# Patient Record
Sex: Female | Born: 1940 | Race: Black or African American | Hispanic: No | Smoking: Never smoker
Health system: Southern US, Community
[De-identification: ages and names within clinical notes are randomized; demographics above are authoritative.]

## PROBLEM LIST (undated history)

## (undated) DIAGNOSIS — J45909 Unspecified asthma, uncomplicated: Secondary | ICD-10-CM

## (undated) DIAGNOSIS — I10 Essential (primary) hypertension: Secondary | ICD-10-CM

## (undated) DIAGNOSIS — K649 Unspecified hemorrhoids: Secondary | ICD-10-CM

## (undated) DIAGNOSIS — K589 Irritable bowel syndrome without diarrhea: Secondary | ICD-10-CM

## (undated) DIAGNOSIS — K635 Polyp of colon: Secondary | ICD-10-CM

## (undated) HISTORY — DX: Polyp of colon: K63.5

## (undated) HISTORY — DX: Unspecified asthma, uncomplicated: J45.909

## (undated) HISTORY — DX: Essential (primary) hypertension: I10

## (undated) HISTORY — DX: Irritable bowel syndrome, unspecified: K58.9

## (undated) HISTORY — PX: ECTOPIC PREGNANCY SURGERY: SHX613

## (undated) HISTORY — DX: Unspecified hemorrhoids: K64.9

## (undated) HISTORY — PX: ABDOMINAL HYSTERECTOMY: SHX81

---

## 1972-04-01 HISTORY — PX: OVARY SURGERY: SHX727

## 2009-04-01 HISTORY — PX: COLONOSCOPY: SHX174

## 2009-12-06 ENCOUNTER — Ambulatory Visit: Payer: Self-pay | Admitting: Gastroenterology

## 2010-03-07 DIAGNOSIS — K635 Polyp of colon: Secondary | ICD-10-CM

## 2010-03-07 HISTORY — DX: Polyp of colon: K63.5

## 2011-04-15 DIAGNOSIS — G4700 Insomnia, unspecified: Secondary | ICD-10-CM | POA: Diagnosis not present

## 2011-04-15 DIAGNOSIS — E0789 Other specified disorders of thyroid: Secondary | ICD-10-CM | POA: Diagnosis not present

## 2011-04-15 DIAGNOSIS — J309 Allergic rhinitis, unspecified: Secondary | ICD-10-CM | POA: Diagnosis not present

## 2011-04-15 DIAGNOSIS — I059 Rheumatic mitral valve disease, unspecified: Secondary | ICD-10-CM | POA: Diagnosis not present

## 2011-04-15 DIAGNOSIS — I10 Essential (primary) hypertension: Secondary | ICD-10-CM | POA: Diagnosis not present

## 2011-04-15 DIAGNOSIS — L258 Unspecified contact dermatitis due to other agents: Secondary | ICD-10-CM | POA: Diagnosis not present

## 2011-04-15 DIAGNOSIS — R002 Palpitations: Secondary | ICD-10-CM | POA: Diagnosis not present

## 2011-04-15 DIAGNOSIS — I499 Cardiac arrhythmia, unspecified: Secondary | ICD-10-CM | POA: Diagnosis not present

## 2011-06-10 DIAGNOSIS — K5732 Diverticulitis of large intestine without perforation or abscess without bleeding: Secondary | ICD-10-CM | POA: Diagnosis not present

## 2011-08-08 DIAGNOSIS — R002 Palpitations: Secondary | ICD-10-CM | POA: Diagnosis not present

## 2011-08-08 DIAGNOSIS — J309 Allergic rhinitis, unspecified: Secondary | ICD-10-CM | POA: Diagnosis not present

## 2011-08-08 DIAGNOSIS — E0789 Other specified disorders of thyroid: Secondary | ICD-10-CM | POA: Diagnosis not present

## 2011-08-08 DIAGNOSIS — L258 Unspecified contact dermatitis due to other agents: Secondary | ICD-10-CM | POA: Diagnosis not present

## 2011-08-08 DIAGNOSIS — G4700 Insomnia, unspecified: Secondary | ICD-10-CM | POA: Diagnosis not present

## 2011-08-08 DIAGNOSIS — I059 Rheumatic mitral valve disease, unspecified: Secondary | ICD-10-CM | POA: Diagnosis not present

## 2011-08-08 DIAGNOSIS — I499 Cardiac arrhythmia, unspecified: Secondary | ICD-10-CM | POA: Diagnosis not present

## 2011-08-08 DIAGNOSIS — I10 Essential (primary) hypertension: Secondary | ICD-10-CM | POA: Diagnosis not present

## 2011-09-03 DIAGNOSIS — E0789 Other specified disorders of thyroid: Secondary | ICD-10-CM | POA: Diagnosis not present

## 2011-09-03 DIAGNOSIS — G4700 Insomnia, unspecified: Secondary | ICD-10-CM | POA: Diagnosis not present

## 2011-09-03 DIAGNOSIS — I10 Essential (primary) hypertension: Secondary | ICD-10-CM | POA: Diagnosis not present

## 2011-09-03 DIAGNOSIS — J309 Allergic rhinitis, unspecified: Secondary | ICD-10-CM | POA: Diagnosis not present

## 2011-09-03 DIAGNOSIS — I059 Rheumatic mitral valve disease, unspecified: Secondary | ICD-10-CM | POA: Diagnosis not present

## 2011-09-03 DIAGNOSIS — I499 Cardiac arrhythmia, unspecified: Secondary | ICD-10-CM | POA: Diagnosis not present

## 2011-10-28 DIAGNOSIS — Z01419 Encounter for gynecological examination (general) (routine) without abnormal findings: Secondary | ICD-10-CM | POA: Diagnosis not present

## 2011-10-28 DIAGNOSIS — Z1211 Encounter for screening for malignant neoplasm of colon: Secondary | ICD-10-CM | POA: Diagnosis not present

## 2011-10-30 ENCOUNTER — Ambulatory Visit (INDEPENDENT_AMBULATORY_CARE_PROVIDER_SITE_OTHER): Payer: Medicare Other | Admitting: Internal Medicine

## 2011-10-30 ENCOUNTER — Encounter: Payer: Self-pay | Admitting: Internal Medicine

## 2011-10-30 ENCOUNTER — Telehealth: Payer: Self-pay | Admitting: Internal Medicine

## 2011-10-30 VITALS — BP 130/68 | HR 72 | Temp 98.0°F | Resp 14 | Ht 63.5 in | Wt 127.8 lb

## 2011-10-30 DIAGNOSIS — M8580 Other specified disorders of bone density and structure, unspecified site: Secondary | ICD-10-CM

## 2011-10-30 DIAGNOSIS — E039 Hypothyroidism, unspecified: Secondary | ICD-10-CM | POA: Diagnosis not present

## 2011-10-30 DIAGNOSIS — M810 Age-related osteoporosis without current pathological fracture: Secondary | ICD-10-CM | POA: Insufficient documentation

## 2011-10-30 DIAGNOSIS — G4700 Insomnia, unspecified: Secondary | ICD-10-CM | POA: Insufficient documentation

## 2011-10-30 DIAGNOSIS — M1710 Unilateral primary osteoarthritis, unspecified knee: Secondary | ICD-10-CM | POA: Insufficient documentation

## 2011-10-30 DIAGNOSIS — M949 Disorder of cartilage, unspecified: Secondary | ICD-10-CM

## 2011-10-30 DIAGNOSIS — K5909 Other constipation: Secondary | ICD-10-CM

## 2011-10-30 DIAGNOSIS — J45909 Unspecified asthma, uncomplicated: Secondary | ICD-10-CM | POA: Insufficient documentation

## 2011-10-30 DIAGNOSIS — K5900 Constipation, unspecified: Secondary | ICD-10-CM

## 2011-10-30 DIAGNOSIS — I10 Essential (primary) hypertension: Secondary | ICD-10-CM

## 2011-10-30 DIAGNOSIS — M899 Disorder of bone, unspecified: Secondary | ICD-10-CM | POA: Diagnosis not present

## 2011-10-30 MED ORDER — ALBUTEROL SULFATE HFA 108 (90 BASE) MCG/ACT IN AERS: 2.0000 | INHALATION_SPRAY | Freq: Four times a day (QID) | RESPIRATORY_TRACT | Status: DC | PRN

## 2011-10-30 NOTE — Patient Instructions (Addendum)
Try the [place] "Carb balance" tortilla for sandwhiches: to increase the fiber in your diet naturally.    We will get your records from Dr. Juel Burrow, and determine when you need repeat labs.

## 2011-10-30 NOTE — Assessment & Plan Note (Addendum)
Primarily affecting the left knee. She has a history of a torn meniscus which she managed conservatively with physical therapy Advil and tramadol. She currently Walks 2 miles daily and works out at [facility] [employer].  There has been no escalation of symptoms. We'll continue current management.

## 2011-10-30 NOTE — Assessment & Plan Note (Signed)
Prior trial of losartan caused dizziness and "out of body experience"  ,  Continue amlodipine .

## 2011-10-30 NOTE — Telephone Encounter (Signed)
OOPS!! YES ,  i HAVE PUT THE  Albuterol inhaler on your desk with her name on it.  She can pick it up anytime.  I will update her chart .

## 2011-10-30 NOTE — Progress Notes (Signed)
Patient ID: Holly Perez, female   DOB: 1941-02-06, 71 y.o.   MRN: [id number]  Patient Active Problem List  Diagnosis  . Unspecified hypothyroidism  . Asthma  . DJD (degenerative joint disease) of knee  . Osteopenia  . Hypertension  . Constipation, chronic  . Insomnia    Subjective:  CC:   Chief Complaint  Patient presents with  . New Patient    HPI:   Holly Perez is a 71 y.o. female who presents as a new patient to establish primary care with the chief complaint of allergic rhinitis,  Hypertension,  Osteopenia and DJD. New patient. She is transferring from Dr. Juel Burrow.  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 She has osteopenia previously treated with alendronate, now off of medications to his of osteonecrosis another long term adverse effects on bones..    Past Medical History  Diagnosis Date  . Asthma     Past Surgical History  Procedure Date  . Ectopic pregnancy surgery     at age 27  . Laparoscopic supracervical hysterectomy     Family History  Problem Relation Age of Onset  . Early death Mother   . COPD Father   . Early death Brother     History   Social History  . Marital Status: Divorced    Spouse Name: N/A    Number of Children: N/A  . Years of Education: N/A   Occupational History  . Not on file.   Social History Main Topics  . Smoking status: Never Smoker   . Smokeless tobacco: Never Used  . Alcohol Use: No  . Drug Use: No  . Sexually Active: Not on file   Other Topics Concern  . Not on file   Social History Narrative  . No narrative on file         @ALLHX @    Review of Systems:   The remainder of the review of systems was negative except those addressed in the HPI.       Objective:  BP 130/68  Pulse 72  Temp 98 F (36.7 C) (Oral)  Resp 14  Ht 5'  3.5" (1.613 m)  Wt 127 lb 12 oz (57.947 kg)  BMI 22.27 kg/m2  SpO2 96%  General appearance: alert, cooperative and appears stated age Ears: normal TM's and external ear canals both ears Throat: lips, mucosa, and tongue normal; teeth and gums normal Neck: no adenopathy, no carotid bruit, supple, symmetrical, trachea midline and thyroid not enlarged, symmetric, no tenderness/mass/nodules Back: symmetric, no curvature. ROM normal. No CVA tenderness. Lungs: clear to auscultation bilaterally Heart: regular rate and rhythm, S1, S2 normal, no murmur, click, rub or gallop Abdomen: soft, non-tender; bowel sounds normal; no masses,  no organomegaly Pulses: 2+ and symmetric Skin: Skin color, texture, turgor normal. No rashes or lesions Lymph nodes: Cervical, supraclavicular, and axillary nodes normal.  Assessment and Plan:  DJD (degenerative joint disease) of knee Primarily affecting the left knee. She has a history of a torn meniscus which she managed conservatively with physical therapy Advil and tramadol. She currently Walks 2 miles daily and works out at [employer].  There has been no escalation of symptoms. We'll continue current management.  Hypertension Prior trial of losartan caused dizziness and "out of body experience"  ,  Continue amlodipine .  Constipation, chronic Managed with occasional use of senna tea and a daily fiber laxatives. She is currently moving her bowels every other day. She is up-to-date on colonoscopies.  Insomnia Chronically managed with when necessary alprazolam low-dose. Since this is effective we'll make no other changes currently.   Updated Medication List Outpatient Encounter Prescriptions as of 10/30/2011  Medication Sig Dispense Refill  . ALPRAZolam (XANAX) 0.25 MG tablet Take 0.25 mg by mouth at bedtime as needed.      Marland Kitchen amLODipine (NORVASC) 10 MG tablet Take 10 mg by mouth daily.      Marland Kitchen aspirin 81 MG tablet Take 81 mg by mouth daily.      . Calcium  Carbonate-Vitamin D (CALTRATE 600+D PO) Take by mouth.      . cholecalciferol (VITAMIN D) 1000 UNITS tablet Take 2,000 Units by mouth daily.      . fish oil-omega-3 fatty acids 1000 MG capsule Take 2 g by mouth daily.      . Fluticasone-Salmeterol (ADVAIR) 250-50 MCG/DOSE AEPB Inhale 1 puff into the lungs every 12 (twelve) hours.      Marland Kitchen levothyroxine (SYNTHROID, LEVOTHROID) 50 MCG tablet Take 50 mcg by mouth daily.      Marland Kitchen loratadine (CLARITIN) 10 MG tablet Take 10 mg by mouth daily.      . Multiple Vitamin (MULTIVITAMIN) tablet Take 1 tablet by mouth daily.      . traMADol (ULTRAM) 50 MG tablet Take 50 mg by mouth 2 (two) times daily as needed.      . vitamin E 100 UNIT capsule Take 100 Units by mouth daily.         Orders Placed This Encounter  Procedures  . HM MAMMOGRAPHY  . HM DEXA SCAN  . HM PAP SMEAR  . HM COLONOSCOPY    Return in about 6 months (around 05/01/2012).

## 2011-10-30 NOTE — Telephone Encounter (Signed)
Patient came back in after her appt and stated you were going to give her an inhaler to keep with her at all times because of her cat allergy.  She stated it was for albuterol.  Please advise.

## 2011-10-30 NOTE — Telephone Encounter (Signed)
Patient came back in after her appt and stated you were going to give her an inhaler to keep with her at all times because of her cat allergy.  She stated it was for albuterol.  Please advise. 

## 2011-10-31 NOTE — Telephone Encounter (Signed)
Patient notified

## 2011-11-01 DIAGNOSIS — E2839 Other primary ovarian failure: Secondary | ICD-10-CM | POA: Diagnosis not present

## 2011-11-01 DIAGNOSIS — M810 Age-related osteoporosis without current pathological fracture: Secondary | ICD-10-CM | POA: Diagnosis not present

## 2011-11-01 DIAGNOSIS — H2510 Age-related nuclear cataract, unspecified eye: Secondary | ICD-10-CM | POA: Diagnosis not present

## 2011-11-01 DIAGNOSIS — M899 Disorder of bone, unspecified: Secondary | ICD-10-CM | POA: Diagnosis not present

## 2011-11-01 DIAGNOSIS — M949 Disorder of cartilage, unspecified: Secondary | ICD-10-CM | POA: Diagnosis not present

## 2011-11-01 DIAGNOSIS — Z780 Asymptomatic menopausal state: Secondary | ICD-10-CM | POA: Diagnosis not present

## 2011-11-03 NOTE — Assessment & Plan Note (Signed)
Managed with occasional use of senna tea and a daily fiber laxatives. She is currently moving her bowels every other day. She is up-to-date on colonoscopies.

## 2011-11-03 NOTE — Assessment & Plan Note (Signed)
Chronically managed with when necessary alprazolam low-dose. Since this is effective we'll make no other changes currently.

## 2012-01-07 DIAGNOSIS — Z23 Encounter for immunization: Secondary | ICD-10-CM | POA: Diagnosis not present

## 2012-01-07 DIAGNOSIS — Z1231 Encounter for screening mammogram for malignant neoplasm of breast: Secondary | ICD-10-CM | POA: Diagnosis not present

## 2012-01-09 LAB — HM MAMMOGRAPHY: HM MAMMO: NORMAL

## 2012-03-03 ENCOUNTER — Other Ambulatory Visit: Payer: Self-pay | Admitting: Internal Medicine

## 2012-03-03 NOTE — Telephone Encounter (Signed)
Refill request for Xanax 0.25 mg. Ok to refill?

## 2012-03-03 NOTE — Telephone Encounter (Signed)
Pt came in wanting to get a refill on alprazolm 0.25mg  take one tablet by mouth twice daily [facility]  Pt has 6 pills left  Pt stated she only takes has needed

## 2012-03-06 ENCOUNTER — Other Ambulatory Visit: Payer: Self-pay

## 2012-03-06 MED ORDER — ALPRAZOLAM 0.25 MG PO TABS: 0.2500 mg | ORAL_TABLET | Freq: Every evening | ORAL | Status: DC | PRN

## 2012-03-06 NOTE — Telephone Encounter (Signed)
rx faxed to [facility] pharmacy.

## 2012-03-10 ENCOUNTER — Other Ambulatory Visit: Payer: Self-pay | Admitting: Internal Medicine

## 2012-03-10 MED ORDER — ALPRAZOLAM 0.25 MG PO TABS: 0.2500 mg | ORAL_TABLET | Freq: Two times a day (BID) | ORAL | Status: DC | PRN

## 2012-03-10 NOTE — Telephone Encounter (Signed)
At her first visit she stated that she was using it only at night for insomnia, which is why I filled it for 30 tablets.  This is a controlled substance and habit forming drug so if she is using it twice daily I prefer not refill it for 90 days at a time but will refill it for 30 days at a time #60.  rx changed in epic and printed out

## 2012-03-10 NOTE — Telephone Encounter (Signed)
Pt came had questions about her rx  She called [facility] today and her alprzolam was only for 1 pill daily and pt stated she takes 1 in am and 1 in pm.   Please advise pt when this is correct  Pt also stated she would like to 3 month supply

## 2012-03-10 NOTE — Telephone Encounter (Signed)
Patient came in office today she stated that her alprazolam 0.25 was written 1 tablet a day but her previous Rx says to take it 2 times a day, she wants to know if she can get it switched to a 90 day supply . She has a refill at [facility] which she is picking up today but will only have a 15 day supply at taking 2 a day.

## 2012-03-11 ENCOUNTER — Other Ambulatory Visit: Payer: Self-pay

## 2012-03-11 NOTE — Telephone Encounter (Signed)
Rx Xanax 0.25 mgfaxed to [facility]

## 2012-05-04 ENCOUNTER — Ambulatory Visit: Payer: Medicare Other | Admitting: Internal Medicine

## 2012-05-14 ENCOUNTER — Ambulatory Visit: Payer: Medicare Other | Admitting: Internal Medicine

## 2012-05-21 ENCOUNTER — Telehealth: Payer: Self-pay | Admitting: Internal Medicine

## 2012-05-21 DIAGNOSIS — J029 Acute pharyngitis, unspecified: Secondary | ICD-10-CM | POA: Diagnosis not present

## 2012-05-21 DIAGNOSIS — K056 Periodontal disease, unspecified: Secondary | ICD-10-CM | POA: Diagnosis not present

## 2012-05-21 DIAGNOSIS — R599 Enlarged lymph nodes, unspecified: Secondary | ICD-10-CM | POA: Diagnosis not present

## 2012-05-21 DIAGNOSIS — K089 Disorder of teeth and supporting structures, unspecified: Secondary | ICD-10-CM | POA: Diagnosis not present

## 2012-05-21 NOTE — Telephone Encounter (Signed)
Pt was called back and offered an appt with Raquel tomorrow. Pt advised that she would just go to urgent care and hung up.

## 2012-05-21 NOTE — Telephone Encounter (Signed)
Patient Information:  Caller Name: Kaylanie  Phone: [phone]  Patient: Holly Perez, Holly Perez  Gender: Female  DOB: Aug 04, 1940  Age: 72 Years  PCP: Duncan Dull (Adults only)  Office Follow Up:  Does the office need to follow up with this patient?: Yes  Instructions For The Office: Ulcer on gum from mal fitting lower denture. Swelling under jaw. Requesting antibiotic or appt. Schedule is full. Please contact patient. Unable to get Dental appt,.   Symptoms  Reason For Call & Symptoms: Patient has set of dentures.  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  She states her lower denture is malfitting and she has formed an ulcer on right side of gum.  She states under neck where the ulcer is -is swollen.  Her dentist cannot see her and she is concerned with the gland swelling. Mild sore throat.  Reviewed Health History In EMR: Yes  Reviewed Medications In EMR: Yes  Reviewed Allergies In EMR: Yes  Reviewed Surgeries / Procedures: No  Date of Onset of Symptoms: 05/17/2012  Treatments Tried: Salt water rinses  Treatments Tried Worked: No  Guideline(s) Used:  Toothache  Disposition Per Guideline:   Call Dentist Today  Reason For Disposition Reached:   Patient wants to be seen  Advice Given:  Pain Medicines:  For pain relief, you can take either acetaminophen, ibuprofen, or naproxen.  They are over-the-counter (OTC) pain drugs. You can buy them at the drugstore.  Ibuprofen (e.g., Motrin, Advil):  Take 400 mg (two 200 mg pills) by mouth every 6 hours.  Call Your Dentist If:  Toothache lasts longer than 24 hours  The toothache becomes worse Warm Salt water rinses

## 2012-05-28 ENCOUNTER — Ambulatory Visit (INDEPENDENT_AMBULATORY_CARE_PROVIDER_SITE_OTHER): Payer: Medicare Other | Admitting: Internal Medicine

## 2012-05-28 ENCOUNTER — Encounter: Payer: Self-pay | Admitting: Internal Medicine

## 2012-05-28 VITALS — BP 120/78 | HR 77 | Temp 98.3°F | Resp 12 | Wt 126.0 lb

## 2012-05-28 DIAGNOSIS — E039 Hypothyroidism, unspecified: Secondary | ICD-10-CM

## 2012-05-28 DIAGNOSIS — G4700 Insomnia, unspecified: Secondary | ICD-10-CM | POA: Diagnosis not present

## 2012-05-28 DIAGNOSIS — Z0000 Encounter for general adult medical examination without abnormal findings: Secondary | ICD-10-CM | POA: Diagnosis not present

## 2012-05-28 DIAGNOSIS — I10 Essential (primary) hypertension: Secondary | ICD-10-CM

## 2012-05-28 DIAGNOSIS — M899 Disorder of bone, unspecified: Secondary | ICD-10-CM

## 2012-05-28 DIAGNOSIS — M8580 Other specified disorders of bone density and structure, unspecified site: Secondary | ICD-10-CM

## 2012-05-28 DIAGNOSIS — M949 Disorder of cartilage, unspecified: Secondary | ICD-10-CM | POA: Diagnosis not present

## 2012-05-28 MED ORDER — LORATADINE 10 MG PO TABS: 10.0000 mg | ORAL_TABLET | Freq: Every day | ORAL | Status: DC

## 2012-05-28 MED ORDER — LEVOTHYROXINE SODIUM 50 MCG PO TABS: 50.0000 ug | ORAL_TABLET | Freq: Every day | ORAL | Status: DC

## 2012-05-28 NOTE — Patient Instructions (Addendum)
I will revew the records form Dr Patton Salles to see if you had lab work.   Return in 6 months for follow up

## 2012-05-28 NOTE — Progress Notes (Signed)
Patient ID: Holly Perez, female   DOB: [birth year], 72 y.o.   MRN: [id number]   The patient is here for annual Medicare wellness examination and management of other chronic and acute problems.  She had her gyn and  Breast exam by Patton Salles last fall.   She developed a mouth ulcer from denture wearing, along with a right sided swollen lymph node.  Couldn't wait to be seen and went to Urgent Care.  Did not tolerate augmentin due to nausea vomiitng and receied clindamycin instead.  Started on Monday .  Gland is feeling better and mouth is feeling better.     The risk factors are reflected in the social history.  The roster of all physicians providing medical care to patient - is listed in the Snapshot section of the chart.  Activities of daily living:  The patient is 100% independent in all ADLs: dressing, toileting, feeding as well as independent mobility  Home safety : The patient has smoke detectors in the home. They wear seatbelts.  There are no firearms at home. There is no violence in the home.   There is no risks for hepatitis, STDs or HIV. There is no   history of blood transfusion. They have no travel history to infectious disease endemic areas of the world.  The patient has seen their dentist in the last six month. They have seen their eye doctor in the last year. They admit to slight hearing difficulty with regard to whispered voices and some television programs.  They have deferred audiologic testing in the last year.  They do not  have excessive sun exposure. Discussed the need for sun protection: hats, long sleeves and use of sunscreen if there is significant sun exposure.   Diet: the importance of a healthy diet is discussed. They do have a healthy diet.  The benefits of regular aerobic exercise were discussed. She walks 4 times per week ,  20 minutes.   Depression screen: there are no signs or vegative symptoms of depression- irritability, change in appetite, anhedonia,  sadness/tearfullness.  Cognitive assessment: the patient manages all their financial and personal affairs and is actively engaged. They could relate day,date,year and events; recalled 2/3 objects at 3 minutes; performed clock-face test normally.  The following portions of the patient's history were reviewed and updated as appropriate: allergies, current medications, past family history, past medical history,  past surgical history, past social history  and problem list.  Visual acuity was not assessed per patient preference since she has regular follow up with her ophthalmologist. Hearing and body mass index were assessed and reviewed.   During the course of the visit the patient was educated and counseled about appropriate screening and preventive services including : fall prevention , diabetes screening, nutrition counseling, colorectal cancer screening, and recommended immunizations.    Objective  BP 120/78  Pulse 77  Temp(Src) 98.3 F (36.8 C) (Oral)  Resp 12  Wt 126 lb (57.153 kg)  BMI 21.97 kg/m2  SpO2 95%  General Appearance:    Alert, cooperative, no distress, appears stated age  Head:    Normocephalic, without obvious abnormality, atraumatic  Eyes:    PERRL, conjunctiva/corneas clear, EOM's intact, fundi    benign, both eyes  Ears:    Normal TM's and external ear canals, both ears  Nose:   Nares normal, septum midline, mucosa normal, no drainage    or sinus tenderness  Throat:   Lips, mucosa,  normal; edentulate  Neck:   Supple,  symmetrical, trachea midline, no adenopathy;    thyroid:  no enlargement/tenderness/nodules; no carotid   bruit or JVD  Back:     Symmetric, no curvature, ROM normal, no CVA tenderness  Lungs:     Clear to auscultation bilaterally, respirations unlabored  Chest Wall:    No tenderness or deformity   Heart:    Regular rate and rhythm, S1 and S2 normal, no murmur, rub   or gallop  Abdomen:     Soft, non-tender, bowel sounds active all four  quadrants,    no masses, no organomegaly  Extremities:   Extremities normal, atraumatic, no cyanosis or edema  Pulses:   2+ and symmetric all extremities  Skin:   Skin color, texture, turgor normal, no rashes or lesions  Lymph nodes:   Cervical, supraclavicular, and axillary nodes normal  Neurologic:   CNII-XII intact, normal strength, sensation and reflexes    throughout    Assessment and Plan  Osteopenia She starting takig fosomax  In 2013 but stopped due to fear of osteonecrosis.   Unspecified hypothyroidism She has not had a TSH since 2012, but she thinks Dr Patton Salles may have draw labs last fall.    Hypertension Well controlled on current regimen. Renal function needs to be checked, no changes today.  Insomnia Managed with alprazolam.   Routine general medical examination at a health care facility Annual comprehensive exam was done excluding breast, pelvic and PAP smear. All screenings have been addressed .    Updated Medication List Outpatient Encounter Prescriptions as of 05/28/2012  Medication Sig Dispense Refill  . albuterol (PROVENTIL HFA;VENTOLIN HFA) 108 (90 BASE) MCG/ACT inhaler Inhale 2 puffs into the lungs every 6 (six) hours as needed for wheezing.  1 Inhaler  2  . ALPRAZolam (XANAX) 0.25 MG tablet Take 1 tablet (0.25 mg total) by mouth 2 (two) times daily as needed for sleep or anxiety.  60 tablet  3  . amLODipine (NORVASC) 10 MG tablet Take 10 mg by mouth daily.      Marland Kitchen aspirin 81 MG tablet Take 81 mg by mouth daily.      . Calcium Carbonate-Vitamin D (CALTRATE 600+D PO) Take by mouth.      . cholecalciferol (VITAMIN D) 1000 UNITS tablet Take 2,000 Units by mouth daily.      . fish oil-omega-3 fatty acids 1000 MG capsule Take 2 g by mouth daily.      . Fluticasone-Salmeterol (ADVAIR) 250-50 MCG/DOSE AEPB Inhale 1 puff into the lungs every 12 (twelve) hours.      Marland Kitchen levothyroxine (SYNTHROID, LEVOTHROID) 50 MCG tablet Take 1 tablet (50 mcg total) by mouth daily.   90 tablet  3  . loratadine (CLARITIN) 10 MG tablet Take 1 tablet (10 mg total) by mouth daily.  90 tablet  3  . Multiple Vitamin (MULTIVITAMIN) tablet Take 1 tablet by mouth daily.      . traMADol (ULTRAM) 50 MG tablet Take 50 mg by mouth 2 (two) times daily as needed.      . vitamin E 100 UNIT capsule Take 100 Units by mouth daily.      . [DISCONTINUED] levothyroxine (SYNTHROID, LEVOTHROID) 50 MCG tablet Take 50 mcg by mouth daily.      . [DISCONTINUED] loratadine (CLARITIN) 10 MG tablet Take 10 mg by mouth daily.       No facility-administered encounter medications on file as of 05/28/2012.

## 2012-05-31 ENCOUNTER — Encounter: Payer: Self-pay | Admitting: Internal Medicine

## 2012-05-31 DIAGNOSIS — Z1231 Encounter for screening mammogram for malignant neoplasm of breast: Secondary | ICD-10-CM | POA: Insufficient documentation

## 2012-05-31 NOTE — Assessment & Plan Note (Signed)
Annual comprehensive exam was done excluding breast, pelvic and PAP smear. All screenings have been addressed .  

## 2012-05-31 NOTE — Assessment & Plan Note (Signed)
Well controlled on current regimen. Renal function needs to be checked, no changes today.

## 2012-05-31 NOTE — Assessment & Plan Note (Signed)
She starting takig fosomax  In 2013 but stopped due to fear of osteonecrosis.

## 2012-05-31 NOTE — Assessment & Plan Note (Signed)
Managed with alprazolam.

## 2012-05-31 NOTE — Assessment & Plan Note (Signed)
She has not had a TSH since 2012, but she thinks Dr Patton Salles may have draw labs last fall.

## 2012-06-04 ENCOUNTER — Telehealth: Payer: Self-pay | Admitting: Internal Medicine

## 2012-06-04 NOTE — Telephone Encounter (Signed)
Pt came in today wanting to know if dr Darrick Huntsman would write her a rx for cephalexin 500mg  take one capsule by mouth four times daily for 14 days. Pt went to urgent care on [address] about 1 1/2 ago for swollen gland.  PT stated her gland is still swollen and little.   [facility]

## 2012-06-04 NOTE — Telephone Encounter (Signed)
Only available opening pt could make was tomorrow with Raquel. Scheduled for 4pm.

## 2012-06-04 NOTE — Telephone Encounter (Signed)
No I cannot prescribe antibiotics on a patient I have not evaluated, because I may not agree with the treatment plan. I am happy to see her if she would like to be seen

## 2012-06-04 NOTE — Telephone Encounter (Signed)
Ok to fill or would you like me to schedule pt?

## 2012-06-05 ENCOUNTER — Ambulatory Visit: Payer: Medicare Other | Admitting: Adult Health

## 2012-06-08 ENCOUNTER — Encounter: Payer: Self-pay | Admitting: Adult Health

## 2012-06-08 ENCOUNTER — Ambulatory Visit (INDEPENDENT_AMBULATORY_CARE_PROVIDER_SITE_OTHER): Payer: Medicare Other | Admitting: Adult Health

## 2012-06-08 VITALS — BP 140/80 | HR 86 | Temp 98.3°F | Resp 14 | Wt 124.5 lb

## 2012-06-08 DIAGNOSIS — R590 Localized enlarged lymph nodes: Secondary | ICD-10-CM | POA: Insufficient documentation

## 2012-06-08 DIAGNOSIS — R599 Enlarged lymph nodes, unspecified: Secondary | ICD-10-CM | POA: Diagnosis not present

## 2012-06-08 MED ORDER — CEPHALEXIN 500 MG PO CAPS: 500.0000 mg | ORAL_CAPSULE | Freq: Four times a day (QID) | ORAL | Status: DC

## 2012-06-08 NOTE — Progress Notes (Signed)
Subjective:    Patient ID: Holly Perez, female    DOB: [birth year], 72 y.o.   MRN: [id number]  HPI  Patient presents to clinic today with symptoms of swollen lymph node right anterior cervical region. She was recently seen in clinic on 05/28/12 for her wellness physical and reported developing a mouth ulcer 2/2 clenching jaw and having dentures dig into her lower gum.  She tried to get in to see her dentist; however, they wanted her to be seen for a cleansing. She tried to explain that she had an ulcer on her lower right gum with swollen lymph node but this did not seem to spark a concern for the person making an appointment at her dentist's office. She ended up going to Urgent Care where they started her on Augmentin. Pt did not tolerate augmentin due to nausea vomiting. She was then started on Cephalexin 500 mg qid x 7 days. Patient reports significant improvement in her symptoms - her mouth ulcer has resolved and her swollen lymph node has decreased in size. She reports that she was taking Cephalexin 2 tablets in the morning and 2 tablets in the evening rather than 4 times daily as was ordered.   Current Outpatient Prescriptions on File Prior to Visit  Medication Sig Dispense Refill  . albuterol (PROVENTIL HFA;VENTOLIN HFA) 108 (90 BASE) MCG/ACT inhaler Inhale 2 puffs into the lungs every 6 (six) hours as needed for wheezing.  1 Inhaler  2  . ALPRAZolam (XANAX) 0.25 MG tablet Take 1 tablet (0.25 mg total) by mouth 2 (two) times daily as needed for sleep or anxiety.  60 tablet  3  . amLODipine (NORVASC) 10 MG tablet Take 10 mg by mouth daily.      Marland Kitchen aspirin 81 MG tablet Take 81 mg by mouth daily.      . Calcium Carbonate-Vitamin D (CALTRATE 600+D PO) Take by mouth daily.       . cholecalciferol (VITAMIN D) 1000 UNITS tablet Take 2,000 Units by mouth daily.      . fish oil-omega-3 fatty acids 1000 MG capsule Take 1 g by mouth daily.       . Fluticasone-Salmeterol (ADVAIR) 250-50 MCG/DOSE AEPB Inhale  1 puff into the lungs every 12 (twelve) hours.      Marland Kitchen levothyroxine (SYNTHROID, LEVOTHROID) 50 MCG tablet Take 1 tablet (50 mcg total) by mouth daily.  90 tablet  3  . Multiple Vitamin (MULTIVITAMIN) tablet Take 1 tablet by mouth daily.      . traMADol (ULTRAM) 50 MG tablet Take 50 mg by mouth 2 (two) times daily as needed.      . vitamin E 100 UNIT capsule Take 100 Units by mouth daily.       No current facility-administered medications on file prior to visit.        Review of Systems  Constitutional: Negative for fever and chills.  HENT:       Mouth ulcer resolved. Still has swollen gland on right side below jaw.  Respiratory: Negative.   Cardiovascular: Negative.     BP 140/80  Pulse 86  Temp(Src) 98.3 F (36.8 C) (Oral)  Resp 14  Wt 124 lb 8 oz (56.473 kg)  BMI 21.71 kg/m2  SpO2 99%     Objective:   Physical Exam  Constitutional: She is oriented to person, place, and time. She appears well-developed and well-nourished. No distress.  HENT:  Head: Normocephalic and atraumatic.  Neck:  Anterior cervical adenopathy on the right.  Cardiovascular: Normal rate and regular rhythm.   Lymphadenopathy:    She has cervical adenopathy.  Neurological: She is alert and oriented to person, place, and time.  Skin: Skin is warm and dry.  Psychiatric: She has a normal mood and affect. Her behavior is normal. Judgment and thought content normal.          Assessment & Plan:

## 2012-06-08 NOTE — Assessment & Plan Note (Signed)
Patient is s/p Cephalexin for 7 days. Patient was not taking medication as instructed (4 times a day). She was taking 2 tablets in the morning and 2 tablets in the evening. Half life of medication requires 4 times/day dosing. Explained this to patient. I will prescribe a 2nd course of Cephalexin 500 mg 4 times a day for 7 days. RTC if symptoms persist.

## 2012-06-08 NOTE — Patient Instructions (Addendum)
  I have prescribed another course of antibiotic - Cephalexin 500 mg 4 times a day for 7 days.  If you are still having symptoms at the end of the treatment, please call the office.

## 2012-08-04 ENCOUNTER — Telehealth: Payer: Self-pay | Admitting: Internal Medicine

## 2012-08-04 DIAGNOSIS — E559 Vitamin D deficiency, unspecified: Secondary | ICD-10-CM

## 2012-08-04 DIAGNOSIS — E785 Hyperlipidemia, unspecified: Secondary | ICD-10-CM

## 2012-08-04 DIAGNOSIS — E039 Hypothyroidism, unspecified: Secondary | ICD-10-CM

## 2012-08-04 DIAGNOSIS — R5381 Other malaise: Secondary | ICD-10-CM

## 2012-08-04 MED ORDER — ALPRAZOLAM 0.25 MG PO TABS: 0.2500 mg | ORAL_TABLET | Freq: Two times a day (BID) | ORAL | Status: DC | PRN

## 2012-08-04 NOTE — Telephone Encounter (Signed)
Ok to refill alprazolam.  Needs fasting labs,  i will enter

## 2012-08-04 NOTE — Telephone Encounter (Signed)
Please advise 

## 2012-08-04 NOTE — Telephone Encounter (Signed)
Pt came in needing refill on alprazolm 0.25mg    Take one tablet by mouth twice daily as needed for sleep or anxiety [facility] Pt only has 1/2 tablet left

## 2012-08-04 NOTE — Telephone Encounter (Signed)
Pt also wanted to know if it was time for labs

## 2012-08-05 NOTE — Telephone Encounter (Signed)
Medication faxed and patient called to verify.

## 2012-09-18 ENCOUNTER — Telehealth: Payer: Self-pay | Admitting: Internal Medicine

## 2012-09-18 MED ORDER — AMLODIPINE BESYLATE 10 MG PO TABS: 10.0000 mg | ORAL_TABLET | Freq: Every day | ORAL | Status: DC

## 2012-09-18 NOTE — Telephone Encounter (Signed)
amLODipine (NORVASC) 10 MG tablet   The patient is out of he medication

## 2012-09-18 NOTE — Telephone Encounter (Signed)
Refill sent.

## 2012-09-29 ENCOUNTER — Telehealth: Payer: Self-pay | Admitting: Internal Medicine

## 2012-09-29 NOTE — Telephone Encounter (Addendum)
Pt needs refill on Tramadol HCL tab 50 mg through [facility].  Pt had refills left on rx but expired 08/07/12, pt needs new script called to [facility].  Rebecca/kathy  Ok to refill,  I can't do it bc you are in the chart

## 2012-09-30 ENCOUNTER — Other Ambulatory Visit: Payer: Self-pay | Admitting: *Deleted

## 2012-09-30 MED ORDER — TRAMADOL HCL 50 MG PO TABS: 50.0000 mg | ORAL_TABLET | Freq: Two times a day (BID) | ORAL | Status: DC | PRN

## 2012-09-30 NOTE — Telephone Encounter (Signed)
"  Pt needs refill on Tramadol HCL tab 50 mg through [facility]. Pt had refills left on rx but expired 08/07/12, pt needs new script called to [facility]." 09/29/12 telephone note.

## 2012-09-30 NOTE — Addendum Note (Signed)
Addended by: Chandra Batch E on: 09/30/2012 01:45 PM   Modules accepted: Orders

## 2012-09-30 NOTE — Telephone Encounter (Signed)
Refill

## 2012-09-30 NOTE — Telephone Encounter (Signed)
Ok'd per Dr. Darrick Huntsman, Rx sent to pharmacy.

## 2012-09-30 NOTE — Telephone Encounter (Deleted)
Refill

## 2012-10-01 MED ORDER — TRAMADOL HCL 50 MG PO TABS: 50.0000 mg | ORAL_TABLET | Freq: Two times a day (BID) | ORAL | Status: DC | PRN

## 2012-10-01 NOTE — Addendum Note (Signed)
Addended by: Sherlene Shams on: 10/01/2012 04:45 PM   Modules accepted: Orders

## 2012-10-01 NOTE — Telephone Encounter (Signed)
Ok to refill,  Authorized in epic and [facility] saupply [facility]

## 2012-11-25 ENCOUNTER — Encounter: Payer: Self-pay | Admitting: *Deleted

## 2012-11-26 ENCOUNTER — Ambulatory Visit (INDEPENDENT_AMBULATORY_CARE_PROVIDER_SITE_OTHER): Payer: Medicare Other | Admitting: Internal Medicine

## 2012-11-26 ENCOUNTER — Encounter: Payer: Self-pay | Admitting: Internal Medicine

## 2012-11-26 VITALS — BP 140/78 | HR 65 | Temp 98.5°F | Resp 14 | Wt 125.8 lb

## 2012-11-26 DIAGNOSIS — I10 Essential (primary) hypertension: Secondary | ICD-10-CM

## 2012-11-26 DIAGNOSIS — Z1211 Encounter for screening for malignant neoplasm of colon: Secondary | ICD-10-CM | POA: Diagnosis not present

## 2012-11-26 DIAGNOSIS — R5381 Other malaise: Secondary | ICD-10-CM | POA: Diagnosis not present

## 2012-11-26 DIAGNOSIS — E785 Hyperlipidemia, unspecified: Secondary | ICD-10-CM

## 2012-11-26 DIAGNOSIS — M1710 Unilateral primary osteoarthritis, unspecified knee: Secondary | ICD-10-CM | POA: Diagnosis not present

## 2012-11-26 DIAGNOSIS — E039 Hypothyroidism, unspecified: Secondary | ICD-10-CM | POA: Diagnosis not present

## 2012-11-26 DIAGNOSIS — Z0000 Encounter for general adult medical examination without abnormal findings: Secondary | ICD-10-CM

## 2012-11-26 DIAGNOSIS — F411 Generalized anxiety disorder: Secondary | ICD-10-CM

## 2012-11-26 LAB — CBC WITH DIFFERENTIAL/PLATELET
Basophils Relative: 0.8 % (ref 0.0–3.0)
Eosinophils Relative: 4.4 % (ref 0.0–5.0)
HCT: 38.9 % (ref 36.0–46.0)
Hemoglobin: 13.1 g/dL (ref 12.0–15.0)
Lymphs Abs: 1.8 10*3/uL (ref 0.7–4.0)
MCV: 87.2 fl (ref 78.0–100.0)
Monocytes Absolute: 0.3 10*3/uL (ref 0.1–1.0)
Neutro Abs: 1.1 10*3/uL — ABNORMAL LOW (ref 1.4–7.7)
Neutrophils Relative %: 31.6 % — ABNORMAL LOW (ref 43.0–77.0)
RBC: 4.47 Mil/uL (ref 3.87–5.11)
WBC: 3.4 10*3/uL — ABNORMAL LOW (ref 4.5–10.5)

## 2012-11-26 MED ORDER — ALPRAZOLAM 0.25 MG PO TABS: 0.2500 mg | ORAL_TABLET | Freq: Every evening | ORAL | Status: DC | PRN

## 2012-11-26 MED ORDER — MELOXICAM 7.5 MG PO TABS: 7.5000 mg | ORAL_TABLET | Freq: Every day | ORAL | Status: DC

## 2012-11-26 MED ORDER — CLONAZEPAM 0.5 MG PO TABS: 0.2500 mg | ORAL_TABLET | Freq: Two times a day (BID) | ORAL | Status: DC | PRN

## 2012-11-26 NOTE — Progress Notes (Signed)
Patient ID: Holly Perez, female   DOB: September 06, 1940, 72 y.o.   MRN: [id number]   Patient Active Problem List   Diagnosis Date Noted  . Generalized anxiety disorder 11/27/2012  . Anterior cervical adenopathy 06/08/2012  . Routine general medical examination at a health care facility 05/31/2012  . Unspecified hypothyroidism 10/30/2011  . Asthma 10/30/2011  . DJD (degenerative joint disease) of knee 10/30/2011  . Osteopenia 10/30/2011  . Hypertension 10/30/2011  . Constipation, chronic 10/30/2011  . Insomnia 10/30/2011    Subjective:  CC:   Chief Complaint  Patient presents with  . Follow-up    6 month    HPI:   Holly Perez a 72 y.o. female who presents 6 month follow up on chronic issues including anxiety and hypertension.    She has had Persistent anxiety since childhood .  Uses 1/2 alprazolam daily but feels it wear off and wants to use another one but does not allow herself  To repeat the dose until bedtime.  She uses a full tablet at bedtime for chronic insomnia with trouble initiating sleep.    Past Medical History  Diagnosis Date  . Asthma     Past Surgical History  Procedure Laterality Date  . Ectopic pregnancy surgery      at age 77  . Laparoscopic supracervical hysterectomy         The following portions of the patient's history were reviewed and updated as appropriate: Allergies, current medications, and problem list.    Review of Systems:   12 Pt  review of systems was negative except those addressed in the HPI,     History   Social History  . Marital Status: Divorced    Spouse Name: N/A    Number of Children: N/A  . Years of Education: N/A   Occupational History  . Not on file.   Social History Main Topics  . Smoking status: Never Smoker   . Smokeless tobacco: Never Used  . Alcohol Use: No  . Drug Use: No  . Sexual Activity: Not on file   Other Topics Concern  . Not on file   Social History Narrative  . No narrative on file     Objective:  Filed Vitals:   11/26/12 1021  BP: 140/78  Pulse: 65  Temp: 98.5 F (36.9 C)  Resp: 14     General appearance: alert, cooperative and appears stated age Ears: normal TM's and external ear canals both ears Throat: lips, mucosa, and tongue normal; teeth and gums normal Neck: no adenopathy, no carotid bruit, supple, symmetrical, trachea midline and thyroid not enlarged, symmetric, no tenderness/mass/nodules Back: symmetric, no curvature. ROM normal. No CVA tenderness. Lungs: clear to auscultation bilaterally Heart: regular rate and rhythm, S1, S2 normal, no murmur, click, rub or gallop Abdomen: soft, non-tender; bowel sounds normal; no masses,  no organomegaly Pulses: 2+ and symmetric Skin: Skin color, texture, turgor normal. No rashes or lesions Lymph nodes: Cervical, supraclavicular, and axillary nodes normal.  Assessment and Plan:  DJD (degenerative joint disease) of knee Managed with meloxicam and tramadol   Hypertension Well controlled on current regimen. Renal function stable, no changes today.  Generalized anxiety disorder She is very reluctant to start an SSRI because of fear of weight gain. Her anxiety is long-standing and began when she was a small child. We discussed changing her alprazolam to clonazepam for longer periods of relief. She can continue to use the alprazolam at bedtime if the clonazepam does not help with her  insomnia.  Unspecified hypothyroidism Thyroid function is WNL on current dose.  No current changes needed.    Updated Medication List Outpatient Encounter Prescriptions as of 11/26/2012  Medication Sig Dispense Refill  . amLODipine (NORVASC) 10 MG tablet Take 1 tablet (10 mg total) by mouth daily.  30 tablet  2  . aspirin 81 MG tablet Take 81 mg by mouth daily.      . Calcium Carbonate-Vitamin D (CALTRATE 600+D PO) Take by mouth daily.       . cholecalciferol (VITAMIN D) 1000 UNITS tablet Take 2,000 Units by mouth daily.      .  fish oil-omega-3 fatty acids 1000 MG capsule Take 1 g by mouth daily.       . Fluticasone-Salmeterol (ADVAIR) 250-50 MCG/DOSE AEPB Inhale 1 puff into the lungs every 12 (twelve) hours.      Marland Kitchen levothyroxine (SYNTHROID, LEVOTHROID) 50 MCG tablet Take 1 tablet (50 mcg total) by mouth daily.  90 tablet  3  . loratadine (CLARITIN) 10 MG tablet Take 10 mg by mouth daily as needed.      . Multiple Vitamin (MULTIVITAMIN) tablet Take 1 tablet by mouth daily.      . traMADol (ULTRAM) 50 MG tablet Take 1 tablet (50 mg total) by mouth 2 (two) times daily as needed.  180 tablet  3  . vitamin E 100 UNIT capsule Take 100 Units by mouth daily.      . [DISCONTINUED] ALPRAZolam (XANAX) 0.25 MG tablet Take 1 tablet (0.25 mg total) by mouth 2 (two) times daily as needed for sleep or anxiety.  60 tablet  3  . albuterol (PROVENTIL HFA;VENTOLIN HFA) 108 (90 BASE) MCG/ACT inhaler Inhale 2 puffs into the lungs every 6 (six) hours as needed for wheezing.  1 Inhaler  2  . ALPRAZolam (XANAX) 0.25 MG tablet Take 1 tablet (0.25 mg total) by mouth at bedtime as needed for sleep.  30 tablet  2  . cephALEXin (KEFLEX) 500 MG capsule Take 1 capsule (500 mg total) by mouth 4 (four) times daily.  28 capsule  0  . clonazePAM (KLONOPIN) 0.5 MG tablet Take 0.5 tablets (0.25 mg total) by mouth 2 (two) times daily as needed for anxiety.  30 tablet  1  . meloxicam (MOBIC) 7.5 MG tablet Take 1 tablet (7.5 mg total) by mouth daily.  90 tablet  3  . [DISCONTINUED] clonazePAM (KLONOPIN) 0.5 MG tablet Take 0.5 tablets (0.25 mg total) by mouth 2 (two) times daily as needed for anxiety.  30 tablet  1   No facility-administered encounter medications on file as of 11/26/2012.     Orders Placed This Encounter  Procedures  . Fecal occult blood, imunochemical    No Follow-up on file.

## 2012-11-26 NOTE — Patient Instructions (Addendum)
We are going to try substituting clonazepam for alprazolam to give you more continuous relief from panic/anxiety.   You can take 1/2 tablet once daily as needed .  You canuse it for insomnia or you can  still use the alprazolam at bedtime if it works better.   The meloxicam can be taken daily as an anti inflammatory  And can be combined with tramadol   Return in 6 months for your annual exam   Please return the stool test as your annual colon ca screening test

## 2012-11-26 NOTE — Assessment & Plan Note (Signed)
Managed with meloxicam and tramadol

## 2012-11-26 NOTE — Assessment & Plan Note (Signed)
Well controlled on current regimen. Renal function stable, no changes today. 

## 2012-11-27 ENCOUNTER — Encounter: Payer: Self-pay | Admitting: Internal Medicine

## 2012-11-27 DIAGNOSIS — F411 Generalized anxiety disorder: Secondary | ICD-10-CM | POA: Insufficient documentation

## 2012-11-27 LAB — COMPREHENSIVE METABOLIC PANEL
Albumin: 4.2 g/dL (ref 3.5–5.2)
Alkaline Phosphatase: 41 U/L (ref 39–117)
BUN: 10 mg/dL (ref 6–23)
Creatinine, Ser: 0.8 mg/dL (ref 0.4–1.2)
Glucose, Bld: 84 mg/dL (ref 70–99)
Potassium: 4.1 mEq/L (ref 3.5–5.1)

## 2012-11-27 LAB — LIPID PANEL
HDL: 57.9 mg/dL (ref >39.00)
LDL Cholesterol: 82 mg/dL (ref 0–99)
Total CHOL/HDL Ratio: 3
VLDL: 6.6 mg/dL (ref 0.0–40.0)

## 2012-11-27 LAB — TSH: TSH: 2.07 u[IU]/mL (ref 0.35–5.50)

## 2012-11-27 NOTE — Assessment & Plan Note (Signed)
Thyroid function is WNL on current dose.  No current changes needed.  

## 2012-11-27 NOTE — Assessment & Plan Note (Signed)
She is very reluctant to start an SSRI because of fear of weight gain. Her anxiety is long-standing and began when she was a small child. We discussed changing her alprazolam to clonazepam for longer periods of relief. She can continue to use the alprazolam at bedtime if the clonazepam does not help with her insomnia.

## 2012-12-01 ENCOUNTER — Encounter: Payer: Self-pay | Admitting: *Deleted

## 2012-12-01 DIAGNOSIS — Z79899 Other long term (current) drug therapy: Secondary | ICD-10-CM | POA: Diagnosis not present

## 2012-12-07 ENCOUNTER — Telehealth: Payer: Self-pay | Admitting: Internal Medicine

## 2012-12-07 NOTE — Telephone Encounter (Signed)
Pt says she was prescribed a new medication at visit 8/28, clonazepam.  Pt states she did not like the side effects of the medication so she discontinued taking it.  Felt a heaviness in her chest, just did not like the way she felt.  She is continuing to take Xanax generic that was prescribed.  Wants to know if she can come here for TB test and needs to know what we charge for that.  Pt has Medicare and [employer].

## 2012-12-08 ENCOUNTER — Telehealth: Payer: Self-pay | Admitting: Internal Medicine

## 2012-12-08 NOTE — Telephone Encounter (Signed)
Sent pt to traige to give nurse symptoms

## 2012-12-08 NOTE — Telephone Encounter (Signed)
Pt called back wanting you to call her about below note

## 2012-12-08 NOTE — Telephone Encounter (Signed)
Patient Information:  Caller Name: Ardene  Phone: [phone]  Patient: Holly Perez, Holly Perez  Gender: Female  DOB: 12/07/1940  Age: 72 Years  PCP: Duncan Dull (Adults only)  Office Follow Up:  Does the office need to follow up with this patient?: Yes  Instructions For The Office: Would like new Rx to replace am dose of Rx. Wanting to continue Xanax at [facility] Asking cost of TB Test and when she can come to office for test. Requesting call back form office to [phone]  today 9/9 until 5 pm or tomorrow 9/10 after 2 pm.  RN Note:  Is caregiver - needs to get TB test, asking cost of test and when she can come to get one.  Symptoms  Reason For Call & Symptoms: Following up on call to office yesterday 9/8 -- concern that she did not get response form Dr Darrick Huntsman.  Afte last OV 8/28 started Clonazepam for am dose and continued Xanax for HS dose.   Found that she had slight heaviness across  chest that would come and go but feeling this like panic attack like Sx that lasted for few seoonds only.  Happening daily after taking new AM Rx.   Decided not to take any more Clonazepam  so went back to prior dosage of 1/2 tab Xanax in am and one tab HS.  Reviewed Health History In EMR: Yes  Reviewed Medications In EMR: Yes  Reviewed Allergies In EMR: Yes  Reviewed Surgeries / Procedures: Yes  Date of Onset of Symptoms: 11/26/2012  Guideline(s) Used:  Chest Pain  Disposition Per Guideline:   Home Care  Reason For Disposition Reached:   Intermittent mild chest pain lasting a few seconds each time  Advice Given:  N/A  Patient Will Follow Care Advice:  YES

## 2012-12-09 MED ORDER — ALPRAZOLAM 0.25 MG PO TABS: 0.2500 mg | ORAL_TABLET | Freq: Two times a day (BID) | ORAL | Status: DC | PRN

## 2012-12-09 NOTE — Telephone Encounter (Signed)
She can resume the alprazolam at 1/2 tablet in am and whole tablet at night and stop the clonazepam.   rx for #60 alprazolam on printer

## 2012-12-09 NOTE — Telephone Encounter (Signed)
Patient stated she stopped the clonazepam due to how she felt and is now taking half tablet of Xanax in am and whole tablet at night. Stated the clonazepam made her feel like she was having panic attack patient scheduled for TB skin test tomorrow nurse visit. Please advise on Xanax dosage patient states she is taking.

## 2012-12-10 ENCOUNTER — Ambulatory Visit: Payer: Medicare Other

## 2012-12-10 NOTE — Telephone Encounter (Signed)
Patient notified as requested script faxed.

## 2012-12-16 ENCOUNTER — Ambulatory Visit (INDEPENDENT_AMBULATORY_CARE_PROVIDER_SITE_OTHER): Payer: Medicare Other | Admitting: *Deleted

## 2012-12-16 DIAGNOSIS — Z111 Encounter for screening for respiratory tuberculosis: Secondary | ICD-10-CM

## 2012-12-18 ENCOUNTER — Ambulatory Visit: Payer: Medicare Other | Admitting: *Deleted

## 2012-12-18 DIAGNOSIS — Z23 Encounter for immunization: Secondary | ICD-10-CM

## 2012-12-18 DIAGNOSIS — Z111 Encounter for screening for respiratory tuberculosis: Secondary | ICD-10-CM

## 2012-12-18 LAB — TB SKIN TEST: TB Skin Test: NEGATIVE

## 2013-01-15 ENCOUNTER — Other Ambulatory Visit: Payer: Self-pay | Admitting: Internal Medicine

## 2013-01-15 DIAGNOSIS — H2510 Age-related nuclear cataract, unspecified eye: Secondary | ICD-10-CM | POA: Diagnosis not present

## 2013-01-15 MED ORDER — TRAMADOL HCL 50 MG PO TABS: 50.0000 mg | ORAL_TABLET | Freq: Two times a day (BID) | ORAL | Status: DC | PRN

## 2013-01-15 NOTE — Telephone Encounter (Signed)
Pt came in today needing a new rx for tramadol hcl tab 50 mg Take 1 tablet by mouth twice a day as needed  Please advise pt when this is ready for pick pt stated she had enough to last for a week

## 2013-01-15 NOTE — Telephone Encounter (Signed)
LAst visit 11/26/12. Ok to refill?

## 2013-01-28 DIAGNOSIS — Z1231 Encounter for screening mammogram for malignant neoplasm of breast: Secondary | ICD-10-CM | POA: Diagnosis not present

## 2013-02-22 ENCOUNTER — Other Ambulatory Visit (INDEPENDENT_AMBULATORY_CARE_PROVIDER_SITE_OTHER): Payer: Medicare Other

## 2013-02-22 ENCOUNTER — Other Ambulatory Visit: Payer: Self-pay | Admitting: Internal Medicine

## 2013-02-22 DIAGNOSIS — K5909 Other constipation: Secondary | ICD-10-CM

## 2013-02-22 DIAGNOSIS — K5900 Constipation, unspecified: Secondary | ICD-10-CM

## 2013-02-23 ENCOUNTER — Encounter: Payer: Self-pay | Admitting: *Deleted

## 2013-03-01 ENCOUNTER — Telehealth: Payer: Self-pay | Admitting: Internal Medicine

## 2013-03-01 NOTE — Telephone Encounter (Signed)
Pt came in today  She [facility] for her rx for levothryroxine  They told her that she needs to get a ok from dr Darrick Huntsman for her to get synthroid.  Not generic Pt stated she has 3 weeks left

## 2013-03-02 MED ORDER — LEVOTHYROXINE SODIUM 50 MCG PO TABS: 50.0000 ug | ORAL_TABLET | Freq: Every day | ORAL | Status: DC

## 2013-03-02 NOTE — Telephone Encounter (Signed)
Script sent  

## 2013-03-10 ENCOUNTER — Telehealth: Payer: Self-pay | Admitting: Internal Medicine

## 2013-03-10 MED ORDER — ALENDRONATE SODIUM 70 MG PO TABS: 70.0000 mg | ORAL_TABLET | ORAL | Status: DC

## 2013-03-10 MED ORDER — LEVOTHYROXINE SODIUM 50 MCG PO TABS: 50.0000 ug | ORAL_TABLET | Freq: Every day | ORAL | Status: DC

## 2013-03-10 NOTE — Telephone Encounter (Signed)
Spoke with pt due to alendronate not on her medication list. Pt states she has been taking it every other week. Originally Rx'd by Dr. Patton Salles who is not working here anymore. Ok to send?

## 2013-03-10 NOTE — Telephone Encounter (Signed)
Yes,  I sent it to her pharmacy

## 2013-03-10 NOTE — Telephone Encounter (Signed)
Pt is needing refill on her Levothyroxine and Alendronate 70 mg. She wants to use [facility] [address].

## 2013-03-22 ENCOUNTER — Telehealth: Payer: Self-pay | Admitting: Internal Medicine

## 2013-03-22 NOTE — Telephone Encounter (Signed)
Pt came in to get a new rx for alprazolam 0.25mg   taget  Pt only has 2 left

## 2013-03-23 ENCOUNTER — Other Ambulatory Visit: Payer: Self-pay | Admitting: Internal Medicine

## 2013-03-23 NOTE — Telephone Encounter (Signed)
Rx faxed to pharmacy  

## 2013-03-23 NOTE — Telephone Encounter (Signed)
Rx refill was also requested from the pharmacy. Rx was printed & signed by Dr. Dan Humphreys & faxed by Kriste Basque this afternoon.

## 2013-03-23 NOTE — Telephone Encounter (Signed)
It appears she has been on this long term.  I am ok to refill this x 1.  I reviewed Dr Melina Schools note from 8/14.  Please confirm with pt that she is not taking clonazepam and xanax at night.  Is she even on clonazepam?  (this was mentioned in Dr Melina Schools note).  Let me know if any problems.  Thanks.

## 2013-03-23 NOTE — Telephone Encounter (Signed)
Last visit 11/26/12

## 2013-04-16 ENCOUNTER — Telehealth: Payer: Self-pay | Admitting: Internal Medicine

## 2013-04-16 NOTE — Telephone Encounter (Signed)
Pt came into clinic asking for refill of alprazolam.  States she usually gets enough for 3 refills but was not given refills this last time.  States she only has about 3 pills left.   Would like refill with 3 refills.

## 2013-04-17 NOTE — Telephone Encounter (Signed)
Left detailed message on pt voicemail to let her know that she still has a refill remaining at [facility].

## 2013-05-20 ENCOUNTER — Telehealth: Payer: Self-pay | Admitting: Internal Medicine

## 2013-05-20 NOTE — Telephone Encounter (Signed)
Pt came in today to see if she could get a refill on alprazolam 0.25mg   She has an appointment 05/27/13 [facility]  She will be out of her meds on 05/22/13

## 2013-05-21 MED ORDER — ALPRAZOLAM 0.25 MG PO TABS: ORAL_TABLET | ORAL | Status: DC

## 2013-05-21 NOTE — Telephone Encounter (Signed)
Script faxed.

## 2013-05-21 NOTE — Telephone Encounter (Signed)
Ok to refill,  printed rx  

## 2013-05-21 NOTE — Telephone Encounter (Signed)
Last visit 11/26/12, has appt next week

## 2013-05-27 ENCOUNTER — Ambulatory Visit: Payer: Medicare Other | Admitting: Internal Medicine

## 2013-06-07 ENCOUNTER — Telehealth: Payer: Self-pay | Admitting: Internal Medicine

## 2013-06-07 ENCOUNTER — Encounter: Payer: Self-pay | Admitting: Internal Medicine

## 2013-06-07 ENCOUNTER — Encounter (INDEPENDENT_AMBULATORY_CARE_PROVIDER_SITE_OTHER): Payer: Self-pay

## 2013-06-07 ENCOUNTER — Ambulatory Visit (INDEPENDENT_AMBULATORY_CARE_PROVIDER_SITE_OTHER): Payer: Medicare Other | Admitting: Internal Medicine

## 2013-06-07 VITALS — BP 140/78 | HR 82 | Temp 98.3°F | Resp 16 | Wt 124.5 lb

## 2013-06-07 DIAGNOSIS — M949 Disorder of cartilage, unspecified: Secondary | ICD-10-CM

## 2013-06-07 DIAGNOSIS — Z86010 Personal history of colonic polyps: Secondary | ICD-10-CM

## 2013-06-07 DIAGNOSIS — E039 Hypothyroidism, unspecified: Secondary | ICD-10-CM

## 2013-06-07 DIAGNOSIS — M8580 Other specified disorders of bone density and structure, unspecified site: Secondary | ICD-10-CM

## 2013-06-07 DIAGNOSIS — E559 Vitamin D deficiency, unspecified: Secondary | ICD-10-CM

## 2013-06-07 DIAGNOSIS — Z79899 Other long term (current) drug therapy: Secondary | ICD-10-CM | POA: Diagnosis not present

## 2013-06-07 DIAGNOSIS — K5900 Constipation, unspecified: Secondary | ICD-10-CM

## 2013-06-07 DIAGNOSIS — M899 Disorder of bone, unspecified: Secondary | ICD-10-CM

## 2013-06-07 DIAGNOSIS — K5732 Diverticulitis of large intestine without perforation or abscess without bleeding: Secondary | ICD-10-CM | POA: Diagnosis not present

## 2013-06-07 DIAGNOSIS — K5730 Diverticulosis of large intestine without perforation or abscess without bleeding: Secondary | ICD-10-CM

## 2013-06-07 DIAGNOSIS — K5909 Other constipation: Secondary | ICD-10-CM

## 2013-06-07 DIAGNOSIS — K5792 Diverticulitis of intestine, part unspecified, without perforation or abscess without bleeding: Secondary | ICD-10-CM

## 2013-06-07 DIAGNOSIS — Z90710 Acquired absence of both cervix and uterus: Secondary | ICD-10-CM

## 2013-06-07 DIAGNOSIS — Z1239 Encounter for other screening for malignant neoplasm of breast: Secondary | ICD-10-CM

## 2013-06-07 MED ORDER — ALPRAZOLAM 0.25 MG PO TABS: ORAL_TABLET | ORAL | Status: DC

## 2013-06-07 MED ORDER — LACTULOSE 10 GM/15ML PO SOLN: 20.0000 g | Freq: Three times a day (TID) | ORAL | Status: DC

## 2013-06-07 MED ORDER — BUSPIRONE HCL 5 MG PO TABS: 5.0000 mg | ORAL_TABLET | Freq: Three times a day (TID) | ORAL | Status: DC

## 2013-06-07 NOTE — Progress Notes (Signed)
Patient ID: Holly Perez, female   DOB: September 16, 1940, 73 y.o.   MRN: [id number]  Patient Active Problem List   Diagnosis Date Noted  . Diverticulosis of colon without hemorrhage 06/08/2013  . Personal history of colonic polyps 06/08/2013  . S/P total hysterectomy 06/08/2013  . Generalized anxiety disorder 11/27/2012  . Anterior cervical adenopathy 06/08/2012  . Routine general medical examination at a health care facility 05/31/2012  . Unspecified hypothyroidism 10/30/2011  . Asthma 10/30/2011  . DJD (degenerative joint disease) of knee 10/30/2011  . Osteopenia 10/30/2011  . Hypertension 10/30/2011  . Constipation, chronic 10/30/2011  . Insomnia 10/30/2011    Subjective:  CC:   Chief Complaint  Patient presents with  . Follow-up    6 month    HPI:   Holly Perez is a 73 y.o. female who presents for Follow up on chronic issues, Last visit was in  August for her annual exam.   1) abdominal pain,  LLQ   . Patient reports a history of diverticulosis by colonoscopy with infrequent attacks over the last 10 to 12 yrs. She reports having sudden onset of LLQ pain about 4 weeks ago which has been intermittent and accompanied by constipation a.  She reports a fever but did not call.  She has modifeid her diet to low residue and has been taking a laxative called "Smooth Moves" with occasional success.  She has tried increasing her dietary fiber.   Last colonsocopy was 2011  By Dr. Dionne Milo.   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  6 month follow up on chronic issues.    She has had Persistent anxiety since childhood .  Uses 1/2 alprazolam daily but feels it wear off and wants to use another one but does not allow herself  To repeat the dose until bedtime.  She uses a full tablet at bedtime for chronic  insomnia with trouble initiating sleep.    Past Medical History  Diagnosis Date  . Asthma     Past Surgical History  Procedure Laterality Date  . Ectopic pregnancy surgery      at age 57  . Abdominal hysterectomy         The following portions of the patient's history were reviewed and updated as appropriate: Allergies, current medications, and problem list.    Review of Systems:   Patient denies headache, fevers, malaise, unintentional weight loss, skin rash, eye pain, sinus congestion and sinus pain, sore throat, dysphagia,  hemoptysis , cough, dyspnea, wheezing, chest pain, palpitations, orthopnea, edema, abdominal pain, nausea, melena, diarrhea, constipation, flank pain, dysuria, hematuria, urinary  Frequency, nocturia, numbness, tingling, seizures,  Focal weakness, Loss of consciousness,  Tremor, insomnia, depression, anxiety, and suicidal ideation.     History   Social History  . Marital Status: Divorced    Spouse Name: N/A    Number of Children: N/A  . Years of Education: N/A   Occupational History  . Not on file.   Social History Main Topics  . Smoking status: Never Smoker   . Smokeless tobacco: Never Used  . Alcohol Use: No  . Drug Use: No  . Sexual Activity: Not on file   Other Topics Concern  . Not on file   Social History Narrative  .  No narrative on file    Objective:  Filed Vitals:   06/07/13 1459  BP: 140/78  Pulse: 82  Temp: 98.3 F (36.8 C)  Resp: 16     General appearance: alert, cooperative and appears stated age Ears: normal TM's and external ear canals both ears Throat: lips, mucosa, and tongue normal; teeth and gums normal Neck: no adenopathy, no carotid bruit, supple, symmetrical, trachea midline and thyroid not enlarged, symmetric, no tenderness/mass/nodules Back: symmetric, no curvature. ROM normal. No CVA tenderness. Lungs: clear to auscultation bilaterally Heart: regular rate and rhythm, S1, S2 normal, no murmur, click,  rub or gallop Abdomen: soft, non-tender; bowel sounds normal; no masses,  no organomegaly Pulses: 2+ and symmetric Skin: Skin color, texture, turgor normal. No rashes or lesions Lymph nodes: Cervical, supraclavicular, and axillary nodes normal.  Assessment and Plan:  Diverticulosis of colon without hemorrhage Her exam today is benign.  She is requesting abx.  Risk of abx use if not inidicated, specifically c dificile colitis, discussed,  Advised her ot use lactulose prn and if ESR and CRP are elevated today suggesting ongoing infection will prescribe cipro/flagyl. .    She is overdue for 3 yr follow up colonoscopy dfor history of polyps,.  Will refer to [facility].   Osteopenia She prefers to take alendronate every other week., and has no side effects .  Last DEXA wis unavailable. Will check vit d level and repeat DEXa   S/P total hysterectomy Last pelvic exam was normal and  noted no cervix per Dr Montez Hageman report July 2013   Constipation, chronic Needs colonoscopy  .  lactulose prn ,.given history of  diverticulosis.  A total of 40 minutes was spent with patient more than half of which was spent in counseling, reviewing records from other prviders and coordination of care.   Updated Medication List Outpatient Encounter Prescriptions as of 06/07/2013  Medication Sig  . alendronate (FOSAMAX) 70 MG tablet Take 1 tablet (70 mg total) by mouth every 7 (seven) days. Take with a full glass of water on an empty stomach.  . ALPRAZolam (XANAX) 0.25 MG tablet TAKE ONE TABLET BY MOUTH TWICE DAILY AS NEEDED FOR SLEEP OR ANXIETY  . amLODipine (NORVASC) 10 MG tablet Take 1 tablet (10 mg total) by mouth daily.  Marland Kitchen aspirin 81 MG tablet Take 81 mg by mouth daily.  . Calcium Carbonate-Vitamin D (CALTRATE 600+D PO) Take by mouth daily.   . cholecalciferol (VITAMIN D) 1000 UNITS tablet Take 2,000 Units by mouth daily.  . fish oil-omega-3 fatty acids 1000 MG capsule Take 1 g by mouth daily.   Marland Kitchen  levothyroxine (SYNTHROID, LEVOTHROID) 50 MCG tablet Take 1 tablet (50 mcg total) by mouth daily before breakfast.  . loratadine (CLARITIN) 10 MG tablet Take 10 mg by mouth daily as needed.  . Multiple Vitamin (MULTIVITAMIN) tablet Take 1 tablet by mouth daily.  . traMADol (ULTRAM) 50 MG tablet Take 1 tablet (50 mg total) by mouth 2 (two) times daily as needed.  . vitamin E 100 UNIT capsule Take 100 Units by mouth daily.  . [DISCONTINUED] ALPRAZolam (XANAX) 0.25 MG tablet TAKE ONE TABLET BY MOUTH TWICE DAILY AS NEEDED FOR SLEEP OR ANXIETY  . albuterol (PROVENTIL HFA;VENTOLIN HFA) 108 (90 BASE) MCG/ACT inhaler Inhale 2 puffs into the lungs every 6 (six) hours as needed for wheezing.  . busPIRone (BUSPAR) 5 MG tablet Take 1 tablet (5 mg total) by mouth 3 (three) times daily.  . Fluticasone-Salmeterol (ADVAIR) 250-50 MCG/DOSE  AEPB Inhale 1 puff into the lungs every 12 (twelve) hours.  Marland Kitchen lactulose (CHRONULAC) 10 GM/15ML solution Take 30 mLs (20 g total) by mouth 3 (three) times daily. As needed for constipation and divertivculitis  . meloxicam (MOBIC) 7.5 MG tablet Take 1 tablet (7.5 mg total) by mouth daily.  . [DISCONTINUED] cephALEXin (KEFLEX) 500 MG capsule Take 1 capsule (500 mg total) by mouth 4 (four) times daily.     Orders Placed This Encounter  Procedures  . [facility]  . CBC with Differential  . Comprehensive metabolic panel  . TSH  . Vit D  25 hydroxy (rtn osteoporosis monitoring)  . Sedimentation rate  . C-reactive protein  . Ambulatory referral to Gastroenterology    No Follow-up on file.

## 2013-06-07 NOTE — Progress Notes (Signed)
Pre-visit discussion using our clinic review tool. No additional management support is needed unless otherwise documented below in the visit note.  

## 2013-06-07 NOTE — Telephone Encounter (Signed)
Holly Perez  Please let me know when Holly Perez had her last cpx so i can make her an appointment All the appointments i see is 6 month follow up thanks

## 2013-06-07 NOTE — Patient Instructions (Addendum)
I am prescribing lactulose  To use whenever you feel  your diverticulitis is flaring.  It is a prescription strength cathartic laxative  If your blood work suggests inflammation or infection,  We will add antibiotics.   I will review your prior reports from Dr Ferne Reus  Your received the Pneumonia vaccine today which I have recommended.   Your still need your tetanus-diptheria-pertussis vaccine (TDAP) but you can get it for less $$$ at the health Dept  with the script I have provided you.   I am recommending that you start Buspar (buspirone) for your anxiety ., It should be taken twice daily to prevent anxiety and can be increased to 3 times daily if needed.  It should keep you from over using your alprazolam,  Which can be addicting I have started you on the lowest dose of 5 mg ,  But we can increase it gradually if needed.

## 2013-06-08 ENCOUNTER — Encounter: Payer: Self-pay | Admitting: Internal Medicine

## 2013-06-08 DIAGNOSIS — Z90710 Acquired absence of both cervix and uterus: Secondary | ICD-10-CM | POA: Insufficient documentation

## 2013-06-08 DIAGNOSIS — Z86010 Personal history of colonic polyps: Secondary | ICD-10-CM | POA: Insufficient documentation

## 2013-06-08 DIAGNOSIS — K5730 Diverticulosis of large intestine without perforation or abscess without bleeding: Secondary | ICD-10-CM | POA: Insufficient documentation

## 2013-06-08 LAB — CBC WITH DIFFERENTIAL/PLATELET
BASOS PCT: 0.2 % (ref 0.0–3.0)
Basophils Absolute: 0 10*3/uL (ref 0.0–0.1)
EOS PCT: 3.7 % (ref 0.0–5.0)
Eosinophils Absolute: 0.2 10*3/uL (ref 0.0–0.7)
HCT: 36.9 % (ref 36.0–46.0)
Hemoglobin: 12.2 g/dL (ref 12.0–15.0)
LYMPHS PCT: 39 % (ref 12.0–46.0)
Lymphs Abs: 1.7 10*3/uL (ref 0.7–4.0)
MCHC: 33.1 g/dL (ref 30.0–36.0)
MCV: 88.9 fl (ref 78.0–100.0)
MONOS PCT: 4.9 % (ref 3.0–12.0)
Monocytes Absolute: 0.2 10*3/uL (ref 0.1–1.0)
Neutro Abs: 2.3 10*3/uL (ref 1.4–7.7)
Neutrophils Relative %: 52.2 % (ref 43.0–77.0)
Platelets: 355 10*3/uL (ref 150.0–400.0)
RBC: 4.15 Mil/uL (ref 3.87–5.11)
RDW: 13.2 % (ref 11.5–14.6)
WBC: 4.4 10*3/uL — AB (ref 4.5–10.5)

## 2013-06-08 LAB — COMPREHENSIVE METABOLIC PANEL
ALT: 24 U/L (ref 0–35)
AST: 34 U/L (ref 0–37)
Albumin: 4.3 g/dL (ref 3.5–5.2)
Alkaline Phosphatase: 47 U/L (ref 39–117)
BUN: 10 mg/dL (ref 6–23)
CO2: 25 meq/L (ref 19–32)
CREATININE: 0.8 mg/dL (ref 0.4–1.2)
Calcium: 9.3 mg/dL (ref 8.4–10.5)
Chloride: 97 mEq/L (ref 96–112)
GFR: 90.19 mL/min (ref >60.00)
GLUCOSE: 84 mg/dL (ref 70–99)
Potassium: 3.9 mEq/L (ref 3.5–5.1)
Sodium: 130 mEq/L — ABNORMAL LOW (ref 135–145)
Total Bilirubin: 0.5 mg/dL (ref 0.3–1.2)
Total Protein: 8.4 g/dL — ABNORMAL HIGH (ref 6.0–8.3)

## 2013-06-08 LAB — TSH: TSH: 1.58 u[IU]/mL (ref 0.35–5.50)

## 2013-06-08 LAB — SEDIMENTATION RATE: Sed Rate: 39 mm/hr — ABNORMAL HIGH (ref 0–22)

## 2013-06-08 LAB — C-REACTIVE PROTEIN: CRP: 0.5 mg/dL (ref 0.5–20.0)

## 2013-06-08 LAB — VITAMIN D 25 HYDROXY (VIT D DEFICIENCY, FRACTURES): VIT D 25 HYDROXY: 48 ng/mL (ref 30–89)

## 2013-06-08 NOTE — Assessment & Plan Note (Addendum)
Her exam today is benign.  She is requesting abx.  Risk of abx use if not inidicated, specifically c dificile colitis, discussed,  Advised her ot use lactulose prn and if ESR and CRP are elevated today suggesting ongoing infection will prescribe cipro/flagyl. .    She is overdue for 3 yr follow up colonoscopy dfor history of polyps,.  Will refer to [facility].

## 2013-06-08 NOTE — Assessment & Plan Note (Signed)
She prefers to take alendronate every other week., and has no side effects .  Last DEXA wis unavailable. Will check vit d level and repeat DEXa

## 2013-06-08 NOTE — Assessment & Plan Note (Signed)
Last pelvic exam was normal and  noted no cervix per Dr Montez Hageman report July 2013

## 2013-06-08 NOTE — Telephone Encounter (Signed)
Please schedule  As soon as an opening for CPE patient does not seem to have had one they all look like follow up.

## 2013-06-08 NOTE — Assessment & Plan Note (Signed)
Needs colonoscopy  .  lactulose prn ,.given history of  diverticulosis.

## 2013-06-09 NOTE — Telephone Encounter (Signed)
Left message for pt to call office please let pt know about appontment on 07/22/13

## 2013-06-11 NOTE — Telephone Encounter (Signed)
Pt aware of appointment 

## 2013-06-14 DIAGNOSIS — Z23 Encounter for immunization: Secondary | ICD-10-CM | POA: Diagnosis not present

## 2013-06-25 ENCOUNTER — Telehealth: Payer: Self-pay | Admitting: Emergency Medicine

## 2013-06-25 NOTE — Telephone Encounter (Signed)
LVM for pt to call our office in reference to bone density and mammogram apt.

## 2013-07-22 ENCOUNTER — Encounter: Payer: Medicare Other | Admitting: Internal Medicine

## 2013-07-22 ENCOUNTER — Encounter: Payer: Self-pay | Admitting: *Deleted

## 2013-07-22 ENCOUNTER — Ambulatory Visit (INDEPENDENT_AMBULATORY_CARE_PROVIDER_SITE_OTHER): Payer: Medicare Other | Admitting: Internal Medicine

## 2013-07-22 ENCOUNTER — Encounter: Payer: Self-pay | Admitting: Internal Medicine

## 2013-07-22 VITALS — BP 150/80 | HR 85 | Temp 98.5°F | Resp 16 | Ht 62.75 in | Wt 125.5 lb

## 2013-07-22 DIAGNOSIS — Z0000 Encounter for general adult medical examination without abnormal findings: Secondary | ICD-10-CM | POA: Diagnosis not present

## 2013-07-22 DIAGNOSIS — K5732 Diverticulitis of large intestine without perforation or abscess without bleeding: Secondary | ICD-10-CM

## 2013-07-22 DIAGNOSIS — F411 Generalized anxiety disorder: Secondary | ICD-10-CM | POA: Diagnosis not present

## 2013-07-22 DIAGNOSIS — Z86010 Personal history of colonic polyps: Secondary | ICD-10-CM | POA: Diagnosis not present

## 2013-07-22 DIAGNOSIS — K5730 Diverticulosis of large intestine without perforation or abscess without bleeding: Secondary | ICD-10-CM

## 2013-07-22 DIAGNOSIS — Z79899 Other long term (current) drug therapy: Secondary | ICD-10-CM | POA: Diagnosis not present

## 2013-07-22 MED ORDER — ALPRAZOLAM 0.25 MG PO TABS: ORAL_TABLET | ORAL | Status: DC

## 2013-07-22 MED ORDER — PAROXETINE HCL 10 MG PO TABS: 10.0000 mg | ORAL_TABLET | Freq: Every day | ORAL | Status: DC

## 2013-07-22 MED ORDER — LACTULOSE 10 GM/15ML PO SOLN: 20.0000 g | Freq: Three times a day (TID) | ORAL | Status: DC

## 2013-07-22 NOTE — Progress Notes (Signed)
Patient ID: Holly Perez, female   DOB: 07-24-1940, 73 y.o.   MRN: [id number]  The patient is here for annual Medicare wellness examination and management of other chronic and acute problems.   Her recent episode of presumed diverticulitis has now resolved .  Need for colonoscopy discussed.   Anxiety:  Prescribed buspirone, but not not tolerated due to bad dreams.  Has chronic anxiety with frequent attacks.  He has resumed use of alprazolam three times daily    The risk factors are reflected in the social history.  The roster of all physicians providing medical care to patient - is listed in the Snapshot section of the chart.  Activities of daily living:  The patient is 100% independent in all ADLs: dressing, toileting, feeding as well as independent mobility  Home safety : The patient has smoke detectors in the home. They wear seatbelts.  There are no firearms at home. There is no violence in the home.   There is no risks for hepatitis, STDs or HIV. There is no   history of blood transfusion. They have no travel history to infectious disease endemic areas of the world.  The patient has seen their dentist in the last six month. They have seen their eye doctor in the last year. They admit to slight hearing difficulty with regard to whispered voices and some television programs.  They have deferred audiologic testing in the last year.  They do not  have excessive sun exposure. Discussed the need for sun protection: hats, long sleeves and use of sunscreen if there is significant sun exposure.   Diet: the importance of a healthy diet is discussed. They do have a healthy diet.  The benefits of regular aerobic exercise were discussed. She walks 4 times per week ,  20 minutes.   Depression screen: there are no signs or vegative symptoms of depression- irritability, change in appetite, anhedonia, sadness/tearfullness.  Cognitive assessment: the patient manages all their financial and personal affairs  and is actively engaged. They could relate day,date,year and events; recalled 2/3 objects at 3 minutes; performed clock-face test normally.  The following portions of the patient's history were reviewed and updated as appropriate: allergies, current medications, past family history, past medical history,  past surgical history, past social history  and problem list.  Visual acuity was not assessed per patient preference since she has regular follow up with her ophthalmologist. Hearing and body mass index were assessed and reviewed.   During the course of the visit the patient was educated and counseled about appropriate screening and preventive services including : fall prevention , diabetes screening, nutrition counseling, colorectal cancer screening, and recommended immunizations.    Objective:  General appearance: alert, cooperative and appears stated age Head: Normocephalic, without obvious abnormality, atraumatic Eyes: conjunctivae/corneas clear. PERRL, EOM's intact. Fundi benign. Ears: normal TM's and external ear canals both ears Nose: Nares normal. Septum midline. Mucosa normal. No drainage or sinus tenderness. Throat: lips, mucosa, and tongue normal; teeth and gums normal Neck: no adenopathy, no carotid bruit, no JVD, supple, symmetrical, trachea midline and thyroid not enlarged, symmetric, no tenderness/mass/nodules Lungs: clear to auscultation bilaterally Breasts: normal appearance, no masses or tenderness Heart: regular rate and rhythm, S1, S2 normal, no murmur, click, rub or gallop Abdomen: soft, non-tender; bowel sounds normal; no masses,  no organomegaly Extremities: extremities normal, atraumatic, no cyanosis or edema Pulses: 2+ and symmetric Skin: Skin color, texture, turgor normal. No rashes or lesions Neurologic: Alert and oriented X 3, normal  strength and tone. Normal symmetric reflexes. Normal coordination and gait.   Assessment and Plan:  Routine general medical  examination at a health care facility Annual comprehensive exam was done including breast,excluding  pelvic and PAP smear. All screenings have been addressed .   Personal history of colonic polyps She is due for follow up colonoscopy. Given her recent episode of diverticulitis, referral to [facility] advised  Generalized anxiety disorder She did not tolerate trial of Buspar but prefers to use alprazolam two to three times daily. Advised to consider SSRI therapy for safer regimen,  She is willing to try paxil.   Diverticulosis of colon without hemorrhage She has had 1 to 2 episodes per year, with no recent hospitalizations. Discussed low residue diet .    Updated Medication List Outpatient Encounter Prescriptions as of 07/22/2013  Medication Sig  . albuterol (PROVENTIL HFA;VENTOLIN HFA) 108 (90 BASE) MCG/ACT inhaler Inhale 2 puffs into the lungs every 6 (six) hours as needed for wheezing.  Marland Kitchen alendronate (FOSAMAX) 70 MG tablet Take 1 tablet (70 mg total) by mouth every 7 (seven) days. Take with a full glass of water on an empty stomach.  . ALPRAZolam (XANAX) 0.25 MG tablet TAKE ONE TABLET BY MOUTH TWICE DAILY AS NEEDED FOR SLEEP OR ANXIETY  . amLODipine (NORVASC) 10 MG tablet Take 1 tablet (10 mg total) by mouth daily.  Marland Kitchen aspirin 81 MG tablet Take 81 mg by mouth daily.  . Calcium Carbonate-Vitamin D (CALTRATE 600+D PO) Take by mouth daily.   . cholecalciferol (VITAMIN D) 1000 UNITS tablet Take 2,000 Units by mouth daily.  . fish oil-omega-3 fatty acids 1000 MG capsule Take 1 g by mouth daily.   . Fluticasone-Salmeterol (ADVAIR) 250-50 MCG/DOSE AEPB Inhale 1 puff into the lungs every 12 (twelve) hours.  Marland Kitchen lactulose (CHRONULAC) 10 GM/15ML solution Take 30 mLs (20 g total) by mouth 3 (three) times daily. As needed for constipation and divertivculitis  . levothyroxine (SYNTHROID, LEVOTHROID) 50 MCG tablet Take 1 tablet (50 mcg total) by mouth daily before breakfast.  . loratadine (CLARITIN) 10  MG tablet Take 10 mg by mouth daily as needed.  . meloxicam (MOBIC) 7.5 MG tablet Take 1 tablet (7.5 mg total) by mouth daily.  . Multiple Vitamin (MULTIVITAMIN) tablet Take 1 tablet by mouth daily.  Marland Kitchen PARoxetine (PAXIL) 10 MG tablet Take 1 tablet (10 mg total) by mouth daily.  . traMADol (ULTRAM) 50 MG tablet Take 1 tablet (50 mg total) by mouth 2 (two) times daily as needed.  . vitamin E 100 UNIT capsule Take 100 Units by mouth daily.  . [DISCONTINUED] ALPRAZolam (XANAX) 0.25 MG tablet TAKE ONE TABLET BY MOUTH TWICE DAILY AS NEEDED FOR SLEEP OR ANXIETY  . [DISCONTINUED] busPIRone (BUSPAR) 5 MG tablet Take 1 tablet (5 mg total) by mouth 3 (three) times daily.  . [DISCONTINUED] lactulose (CHRONULAC) 10 GM/15ML solution Take 30 mLs (20 g total) by mouth 3 (three) times daily. As needed for constipation and divertivculitis

## 2013-07-22 NOTE — Patient Instructions (Signed)
You had your annual Medicare wellness exam today, but we also discussed ways to manage your persistent anxiety  Since you did not tolerate buspirone and still have episodes despite using alprazolam three times a day.  I recommend trying generic Paxil (paroxetine) one time daily to help your anxiety. You can continue the alprazolam   We will schedule your mammogram soon.   We will arrange your referral to Dr. [facility] for your colonoscopy  You may use the lactulose once a week IF NEEDED for constipation

## 2013-07-23 ENCOUNTER — Encounter: Payer: Self-pay | Admitting: Emergency Medicine

## 2013-07-24 NOTE — Assessment & Plan Note (Signed)
Annual comprehensive exam was done including breast, excluding pelvic and PAP smear. All screenings have been addressed .  

## 2013-07-24 NOTE — Assessment & Plan Note (Signed)
She is due for follow up colonoscopy. Given her recent episode of diverticulitis, referral to [facility] advised

## 2013-07-24 NOTE — Assessment & Plan Note (Signed)
She did not tolerate trial of Buspar but prefers to use alprazolam two to three times daily. Advised to consider SSRI therapy for safer regimen,  She is willing to try paxil.

## 2013-07-24 NOTE — Assessment & Plan Note (Signed)
She has had 1 to 2 episodes per year, with no recent hospitalizations. Discussed low residue diet .

## 2013-08-12 ENCOUNTER — Telehealth: Payer: Self-pay | Admitting: Internal Medicine

## 2013-08-12 NOTE — Telephone Encounter (Signed)
Last time pt was here she was prescribed paroxetine.  Pt states she only took #4 or #5 pills.  Pt states side effects of unable to sleep, drowsy at work so she decided not to take it.  Has an appt 5/28.  Asking if she should keep this appt since she is not taking the medication or cancel?  Pt would like a call.

## 2013-08-13 NOTE — Telephone Encounter (Signed)
If she is interested in alternative therapy,  She should keep the appt.  If not, she does not.

## 2013-08-13 NOTE — Telephone Encounter (Signed)
Please advise 

## 2013-08-13 NOTE — Telephone Encounter (Signed)
Pt notified. Wants to think about it and will call back if she wants to cancel the appointment.

## 2013-08-17 ENCOUNTER — Ambulatory Visit: Payer: Self-pay | Admitting: General Surgery

## 2013-08-26 ENCOUNTER — Ambulatory Visit (INDEPENDENT_AMBULATORY_CARE_PROVIDER_SITE_OTHER): Payer: Medicare Other | Admitting: Internal Medicine

## 2013-08-26 ENCOUNTER — Encounter: Payer: Self-pay | Admitting: Internal Medicine

## 2013-08-26 VITALS — BP 148/78 | HR 71 | Temp 98.2°F | Resp 16 | Ht 62.75 in | Wt 127.0 lb

## 2013-08-26 DIAGNOSIS — F411 Generalized anxiety disorder: Secondary | ICD-10-CM | POA: Diagnosis not present

## 2013-08-26 MED ORDER — PAROXETINE HCL 10 MG PO TABS: 10.0000 mg | ORAL_TABLET | Freq: Every day | ORAL | Status: DC

## 2013-08-26 NOTE — Progress Notes (Signed)
Patient ID: Holly Perez, female   DOB: 01-28-1941, 73 y.o.   MRN: [id number]   Patient Active Problem List   Diagnosis Date Noted  . Diverticulosis of colon without hemorrhage 06/08/2013  . Personal history of colonic polyps 06/08/2013  . S/P total hysterectomy 06/08/2013  . Generalized anxiety disorder 11/27/2012  . Anterior cervical adenopathy 06/08/2012  . Routine general medical examination at a health care facility 05/31/2012  . Unspecified hypothyroidism 10/30/2011  . Asthma 10/30/2011  . DJD (degenerative joint disease) of knee 10/30/2011  . Osteopenia 10/30/2011  . Hypertension 10/30/2011  . Constipation, chronic 10/30/2011  . Insomnia 10/30/2011    Subjective:  CC:   Chief Complaint  Patient presents with  . Follow-up    anxiety medication    HPI:   Holly Perez is a 73 y.o. female who presents for Follow up on treatment for GAD with paxil.  She did not tolerate brief trial of paxil 10 mg  And stopped it after 4 doses due to sedation. Did not start with 1/2 tablet.   She has been using very low dose alprazolam at  0.25 mg total dose not more than twice daily , in a divided dose.     Past Medical History  Diagnosis Date  . Asthma     Past Surgical History  Procedure Laterality Date  . Ectopic pregnancy surgery      at age 59  . Abdominal hysterectomy         The following portions of the patient's history were reviewed and updated as appropriate: Allergies, current medications, and problem list.    Review of Systems:   Patient denies headache, fevers, malaise, unintentional weight loss, skin rash, eye pain, sinus congestion and sinus pain, sore throat, dysphagia,  hemoptysis , cough, dyspnea, wheezing, chest pain, palpitations, orthopnea, edema, abdominal pain, nausea, melena, diarrhea, constipation, flank pain, dysuria, hematuria, urinary  Frequency, nocturia, numbness, tingling, seizures,  Focal weakness, Loss of consciousness,  Tremor,depression, ,  and suicidal ideation.     History   Social History  . Marital Status: Divorced    Spouse Name: N/A    Number of Children: N/A  . Years of Education: N/A   Occupational History  . Not on file.   Social History Main Topics  . Smoking status: Never Smoker   . Smokeless tobacco: Never Used  . Alcohol Use: No  . Drug Use: No  . Sexual Activity: Not on file   Other Topics Concern  . Not on file   Social History Narrative  . No narrative on file    Objective:  Filed Vitals:   08/26/13 0908  BP: 148/78  Pulse: 71  Temp: 98.2 F (36.8 C)  Resp: 16     General appearance: alert, cooperative and appears stated age Heart: regular rate and rhythm, S1, S2 normal, no murmur, click, rub or gallop Pulses: 2+ and symmetric Psych: speech and affect appropriate, makes good eye contact  Assessment and Plan:  Generalized anxiety disorder Discussed her use of alprazolam which is at a very low dose, and ok to continue. She is willing to retry paxil at 1/2 tablet daily for first week. Her anxiety is chronic and lifelong.    Updated Medication List Outpatient Encounter Prescriptions as of 08/26/2013  Medication Sig  . alendronate (FOSAMAX) 70 MG tablet Take 1 tablet (70 mg total) by mouth every 7 (seven) days. Take with a full glass of water on an empty stomach.  . ALPRAZolam (XANAX) 0.25  MG tablet TAKE ONE TABLET BY MOUTH TWICE DAILY AS NEEDED FOR SLEEP OR ANXIETY  . amLODipine (NORVASC) 10 MG tablet Take 1 tablet (10 mg total) by mouth daily.  Marland Kitchen aspirin 81 MG tablet Take 81 mg by mouth daily.  . Calcium Carbonate-Vitamin D (CALTRATE 600+D PO) Take by mouth daily.   . cholecalciferol (VITAMIN D) 1000 UNITS tablet Take 2,000 Units by mouth daily.  . fish oil-omega-3 fatty acids 1000 MG capsule Take 1 g by mouth daily.   . Fluticasone-Salmeterol (ADVAIR) 250-50 MCG/DOSE AEPB Inhale 1 puff into the lungs every 12 (twelve) hours.  Marland Kitchen lactulose (CHRONULAC) 10 GM/15ML solution Take 30  mLs (20 g total) by mouth 3 (three) times daily. As needed for constipation and divertivculitis  . levothyroxine (SYNTHROID, LEVOTHROID) 50 MCG tablet Take 1 tablet (50 mcg total) by mouth daily before breakfast.  . loratadine (CLARITIN) 10 MG tablet Take 10 mg by mouth daily as needed.  . Multiple Vitamin (MULTIVITAMIN) tablet Take 1 tablet by mouth daily.  . traMADol (ULTRAM) 50 MG tablet Take 1 tablet (50 mg total) by mouth 2 (two) times daily as needed.  . vitamin E 100 UNIT capsule Take 100 Units by mouth daily.  Marland Kitchen albuterol (PROVENTIL HFA;VENTOLIN HFA) 108 (90 BASE) MCG/ACT inhaler Inhale 2 puffs into the lungs every 6 (six) hours as needed for wheezing.  . meloxicam (MOBIC) 7.5 MG tablet Take 1 tablet (7.5 mg total) by mouth daily.  Marland Kitchen PARoxetine (PAXIL) 10 MG tablet Take 1 tablet (10 mg total) by mouth daily.     No orders of the defined types were placed in this encounter.    No Follow-up on file.

## 2013-08-26 NOTE — Patient Instructions (Signed)
You can take the 1/2 tablet daily of paroxetine in the evening.  . If it does not make you sleepy you can still use the alprazolam that evening if needed for anxiety

## 2013-08-26 NOTE — Progress Notes (Signed)
Pre-visit discussion using our clinic review tool. No additional management support is needed unless otherwise documented below in the visit note.  

## 2013-08-29 NOTE — Assessment & Plan Note (Signed)
Discussed her use of alprazolam which is at a very low dose, and ok to continue. She is willing to retry paxil at 1/2 tablet daily for first week. Her anxiety is chronic and lifelong.

## 2013-09-01 ENCOUNTER — Encounter: Payer: Self-pay | Admitting: Internal Medicine

## 2013-09-11 DIAGNOSIS — R599 Enlarged lymph nodes, unspecified: Secondary | ICD-10-CM | POA: Diagnosis not present

## 2013-09-11 DIAGNOSIS — R259 Unspecified abnormal involuntary movements: Secondary | ICD-10-CM | POA: Diagnosis not present

## 2013-09-14 ENCOUNTER — Encounter: Payer: Self-pay | Admitting: *Deleted

## 2013-09-14 ENCOUNTER — Ambulatory Visit (INDEPENDENT_AMBULATORY_CARE_PROVIDER_SITE_OTHER): Payer: Medicare Other | Admitting: General Surgery

## 2013-09-14 ENCOUNTER — Encounter: Payer: Self-pay | Admitting: General Surgery

## 2013-09-14 VITALS — BP 142/78 | HR 76 | Temp 98.2°F | Resp 12 | Ht 62.0 in | Wt 125.0 lb

## 2013-09-14 DIAGNOSIS — K5732 Diverticulitis of large intestine without perforation or abscess without bleeding: Secondary | ICD-10-CM | POA: Diagnosis not present

## 2013-09-14 DIAGNOSIS — R1032 Left lower quadrant pain: Secondary | ICD-10-CM | POA: Diagnosis not present

## 2013-09-14 DIAGNOSIS — R599 Enlarged lymph nodes, unspecified: Secondary | ICD-10-CM

## 2013-09-14 MED ORDER — CEPHALEXIN 500 MG PO CAPS: 500.0000 mg | ORAL_CAPSULE | Freq: Three times a day (TID) | ORAL | Status: DC

## 2013-09-14 NOTE — Patient Instructions (Signed)
The patient will be encouraged to make use of a daily fiber supplement. Patient to return in an year and half for an colonoscopy

## 2013-09-14 NOTE — Progress Notes (Signed)
Patient ID: Holly Perez, female   DOB: July 29, 1940, 73 y.o.   MRN: [id number]  Chief Complaint  Patient presents with  . Diverticulitis    HPI Holly Perez is a 73 y.o. female here today for an evaluation diverticulitis.She reports having sudden onset of LLQ pain about 2 months ago which has been intermittent over the years, and accompanied by constipation. No blood in her stool or passage of mucus. Her present episode resolved about a month ago. 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   HPI  Past Medical History  Diagnosis Date  . Asthma   . Colon polyp   . Irritable bowel syndrome (IBS)   . Hypertension   . Hemorrhoids     Past Surgical History  Procedure Laterality Date  . Ectopic pregnancy surgery      at age 53  . Abdominal hysterectomy    . Ovary surgery  1974    tumor  . Colonoscopy  2011    DR. Ifitkar ; tubular adenoma of the ascending colon without dysplasia or malignancy.    Family History  Problem Relation Age of Onset  . Early death Mother   . COPD Father   . Early death Brother     Social History History  Substance Use Topics  . Smoking status: Never Smoker   . Smokeless tobacco: Never Used  . Alcohol Use: No    Allergies  Allergen Reactions  . Augmentin [Amoxicillin-Pot Clavulanate] Nausea And Vomiting  . Latex     Current Outpatient Prescriptions  Medication Sig Dispense Refill  . albuterol (PROVENTIL HFA;VENTOLIN HFA) 108 (90 BASE) MCG/ACT inhaler Inhale into the lungs every 6 (six) hours as needed for wheezing or shortness of breath.      Marland Kitchen alendronate (FOSAMAX) 70 MG tablet Take 1 tablet (70 mg total) by mouth every 7 (seven) days. Take with a full glass of water on an empty stomach.  4 tablet  11  . ALPRAZolam (XANAX) 0.25 MG tablet TAKE ONE TABLET BY MOUTH TWICE DAILY AS NEEDED FOR SLEEP OR ANXIETY  60 tablet  5  . amLODipine (NORVASC) 10 MG tablet Take 1 tablet (10 mg  total) by mouth daily.  30 tablet  2  . aspirin 81 MG tablet Take 81 mg by mouth daily.      . busPIRone (BUSPAR) 5 MG tablet Take 5 mg by mouth 2 (two) times daily.       . Calcium Carbonate-Vitamin D (CALTRATE 600+D PO) Take by mouth daily.       . cholecalciferol (VITAMIN D) 1000 UNITS tablet Take 2,000 Units by mouth daily.      . clindamycin (CLEOCIN) 300 MG capsule Take 300 mg by mouth 3 (three) times daily.       . fish oil-omega-3 fatty acids 1000 MG capsule Take 1 g by mouth daily.       . Fluticasone-Salmeterol (ADVAIR) 250-50 MCG/DOSE AEPB Inhale 1 puff into the lungs every 12 (twelve) hours.      Marland Kitchen lactulose (CHRONULAC) 10 GM/15ML solution Take 30 mLs (20 g total) by mouth 3 (three) times daily. As needed for constipation and divertivculitis  240 mL  0  . levothyroxine (SYNTHROID, LEVOTHROID) 50 MCG tablet Take 1 tablet (50 mcg total) by mouth daily before breakfast.  90 tablet  2  . loratadine (CLARITIN) 10 MG tablet Take 10 mg by mouth daily as needed.      Marland Kitchen  Multiple Vitamin (MULTIVITAMIN) tablet Take 1 tablet by mouth daily.      Marland Kitchen PARoxetine (PAXIL) 10 MG tablet Take 1 tablet (10 mg total) by mouth daily.  30 tablet  2  . traMADol (ULTRAM) 50 MG tablet Take 1 tablet (50 mg total) by mouth 2 (two) times daily as needed.  180 tablet  3  . vitamin E 100 UNIT capsule Take 100 Units by mouth daily.      . cephALEXin (KEFLEX) 500 MG capsule Take 1 capsule (500 mg total) by mouth 3 (three) times daily.  30 capsule  0   No current facility-administered medications for this visit.    Review of Systems Review of Systems  Constitutional: Negative.   Respiratory: Negative.   Cardiovascular: Negative.   Gastrointestinal: Positive for abdominal pain and constipation. Negative for nausea, vomiting, diarrhea, blood in stool, abdominal distention, anal bleeding and rectal pain.    Blood pressure 142/78, pulse 76, temperature 98.2 F (36.8 C), resp. rate 12, height 5\' 2"  (1.575 m), weight  125 lb (56.7 kg).  Physical Exam Physical Exam  Constitutional: She is oriented to person, place, and time. She appears well-developed and well-nourished.  HENT:  Mouth/Throat: Mucous membranes are normal. She has dentures (no intraoral lesions identified).  Eyes: Conjunctivae are normal. No scleral icterus.  Neck: Trachea normal. Neck supple. No mass present.    Cardiovascular: Normal rate, regular rhythm and normal heart sounds.   Pulmonary/Chest: Effort normal and breath sounds normal.  Abdominal: Soft. Normal appearance and bowel sounds are normal. There is no hepatomegaly. There is no tenderness.  Well healed midline incision  Lymphadenopathy:   Swollen 2 cm right cervical node   Neurological: She is alert and oriented to person, place, and time.  Skin: Skin is warm and dry.    Data Reviewed Colonoscopy from December 06, 2009 showed a 2 mm tubular in no one in the ascending colon.  Assessment    Anterior cervical adenopathy, minimal response after 4 days of Cleocin.  Small adenoma of the colon removed September 2011.    Plan    The patient reports that when she developed a swollen glands in the neck in the past usually bonded well to Keflex. A prescription for Keflex 500 mg t.i.d. For 10 days was applied. The use of local heat was encouraged.  If this area does not resolve she was encouraged to followup with her PCP for reassessment and possible ENT referral.  Considering a small polyp I would not recommend a followup endoscopy until September 2016 unless she develops a change in bowel function outside of her long-standing constipation.  A daily fiber supplement may help smooth her periods of constipation. She can continue to use her OTC laxatives as needed.  She will be contacted fall 2016 to arrange for a screening colonoscopy.     PCP: Mattie Marlin 09/14/2013, 9:09 PM

## 2013-10-19 ENCOUNTER — Other Ambulatory Visit: Payer: Self-pay | Admitting: Internal Medicine

## 2013-10-22 DIAGNOSIS — H6120 Impacted cerumen, unspecified ear: Secondary | ICD-10-CM | POA: Diagnosis not present

## 2013-10-22 DIAGNOSIS — H6690 Otitis media, unspecified, unspecified ear: Secondary | ICD-10-CM | POA: Diagnosis not present

## 2013-11-17 ENCOUNTER — Other Ambulatory Visit: Payer: Self-pay | Admitting: Internal Medicine

## 2013-11-18 NOTE — Telephone Encounter (Signed)
Last refill 3.10.15, last OV 5.28.15.  Please advise refill.

## 2013-11-18 NOTE — Telephone Encounter (Signed)
Ok to refill,  printed rx  

## 2013-11-18 NOTE — Telephone Encounter (Signed)
Rx called in to [facility]

## 2013-12-04 ENCOUNTER — Other Ambulatory Visit: Payer: Self-pay | Admitting: Internal Medicine

## 2013-12-07 ENCOUNTER — Encounter: Payer: Self-pay | Admitting: Podiatry

## 2013-12-07 ENCOUNTER — Ambulatory Visit (INDEPENDENT_AMBULATORY_CARE_PROVIDER_SITE_OTHER): Payer: Medicare Other | Admitting: Podiatry

## 2013-12-07 VITALS — BP 143/77 | HR 75 | Resp 16 | Ht 63.0 in | Wt 125.0 lb

## 2013-12-07 DIAGNOSIS — M79609 Pain in unspecified limb: Secondary | ICD-10-CM | POA: Diagnosis not present

## 2013-12-07 DIAGNOSIS — Q828 Other specified congenital malformations of skin: Secondary | ICD-10-CM

## 2013-12-07 DIAGNOSIS — B351 Tinea unguium: Secondary | ICD-10-CM

## 2013-12-07 NOTE — Progress Notes (Signed)
   Subjective:    Patient ID: Holly Perez, female    DOB: 10/30/1940, 73 y.o.   MRN: [id number]  HPI Comments: 73 year old female presents the office today with complaints of painful elongated nails and possible nail fungus. Also since this is a callus on the plantar aspect of the right foot which has become painful with certain shoe gear and ambulation. Denies any prior treatment. No other complaints.     Review of Systems  Gastrointestinal: Positive for constipation.  Musculoskeletal:       Back pain   Psychiatric/Behavioral: The patient is nervous/anxious.   All other systems reviewed and are negative.      Objective:   Physical Exam AAO x3, NAD DP/PT pulses palpable b/l, CRT < 3sec Protective sensation intact with Simms Weinstein monofilament, vibratory sensation intact., Achilles tendon reflex intact. Nails hypertrophic, dystrophic, elongated, yellow-brown discoloration, brittle. Nails painful with direct pressure. Surrounding erythema or drainage. Hyperkeratotic lesion right submetatarsal 5, right plantar hallux, left plantar hallux. No open lesions. MMT 5/5, ROM WNL No leg pain/warmth/edema       Assessment & Plan:  73 year old female with symptomatic onychomycosis, hyperkeratotic lesions x3. -Treatment options discussed including alternatives, risks complications. -Nails sharply debrided G86 without complications. -Keratotic lesions sharply debrided x3 without complications. -Discussed treatment options for onychomycosis. Patient elects to proceed with over-the-counter therapy. Recommended fungi nail. -Followup as needed or if there any change in symptoms or questions/concerns

## 2013-12-07 NOTE — Patient Instructions (Signed)
Onychomycosis/Fungal Toenails  WHAT IS IT? An infection that lies within the keratin of your nail plate that is caused by a fungus.  WHY ME? Fungal infections affect all ages, sexes, races, and creeds.  There may be many factors that predispose you to a fungal infection such as age, coexisting medical conditions such as diabetes, or an autoimmune disease; stress, medications, fatigue, genetics, etc.  Bottom line: fungus thrives in a warm, moist environment and your shoes offer such a location.  IS IT CONTAGIOUS? Theoretically, yes.  You do not want to share shoes, nail clippers or files with someone who has fungal toenails.  Walking around barefoot in the same room or sleeping in the same bed is unlikely to transfer the organism.  It is important to realize, however, that fungus can spread easily from one nail to the next on the same foot.  HOW DO WE TREAT THIS?  There are several ways to treat this condition.  Treatment may depend on many factors such as age, medications, pregnancy, liver and kidney conditions, etc.  It is best to ask your doctor which options are available to you.  1. No treatment.   Unlike many other medical concerns, you can live with this condition.  However for many people this can be a painful condition and may lead to ingrown toenails or a bacterial infection.  It is recommended that you keep the nails cut short to help reduce the amount of fungal nail. 2. Topical treatment.  These range from herbal remedies to prescription strength nail lacquers.  About 40-50% effective, topicals require twice daily application for approximately 9 to 12 months or until an entirely new nail has grown out.  The most effective topicals are medical grade medications available through physicians offices. 3. Oral antifungal medications.  With an 80-90% cure rate, the most common oral medication requires 3 to 4 months of therapy and stays in your system for a year as the new nail grows out.  Oral  antifungal medications do require blood work to make sure it is a safe drug for you.  A liver function panel will be performed prior to starting the medication and after the first month of treatment.  It is important to have the blood work performed to avoid any harmful side effects.  In general, this medication safe but blood work is required. 4. Laser Therapy.  This treatment is performed by applying a specialized laser to the affected nail plate.  This therapy is noninvasive, fast, and non-painful.  It is not covered by insurance and is therefore, out of pocket.  The results have been very good with a 80-95% cure rate.  The [facility] is the only practice in the area to offer this therapy. 5. Permanent Nail Avulsion.  Removing the entire nail so that a new nail will not grow back.   Over the counter treatment for toenail fungus- fungi-nail

## 2013-12-10 ENCOUNTER — Telehealth: Payer: Self-pay | Admitting: Internal Medicine

## 2013-12-10 MED ORDER — AMLODIPINE BESYLATE 10 MG PO TABS: 10.0000 mg | ORAL_TABLET | Freq: Every day | ORAL | Status: DC

## 2013-12-10 NOTE — Telephone Encounter (Signed)
Rx sent to pharmacy by escript. Left message, notifying pt

## 2013-12-10 NOTE — Telephone Encounter (Signed)
Pt needs refill on Amlodipine 10 mg. Call pt when ready

## 2014-01-06 ENCOUNTER — Ambulatory Visit (INDEPENDENT_AMBULATORY_CARE_PROVIDER_SITE_OTHER): Payer: Medicare Other | Admitting: *Deleted

## 2014-01-06 DIAGNOSIS — Z23 Encounter for immunization: Secondary | ICD-10-CM | POA: Diagnosis not present

## 2014-01-31 ENCOUNTER — Encounter: Payer: Self-pay | Admitting: Podiatry

## 2014-02-08 DIAGNOSIS — Z1231 Encounter for screening mammogram for malignant neoplasm of breast: Secondary | ICD-10-CM | POA: Diagnosis not present

## 2014-02-08 LAB — HM MAMMOGRAPHY

## 2014-02-14 ENCOUNTER — Other Ambulatory Visit: Payer: Self-pay | Admitting: Internal Medicine

## 2014-02-14 NOTE — Telephone Encounter (Signed)
Last refill 10.12.15, last OV 5.28.15.  Please advise refill.

## 2014-02-16 NOTE — Telephone Encounter (Signed)
Faxed to pharmacy

## 2014-02-16 NOTE — Telephone Encounter (Signed)
Ok to refill,  printed rx  

## 2014-03-10 DIAGNOSIS — H2513 Age-related nuclear cataract, bilateral: Secondary | ICD-10-CM | POA: Diagnosis not present

## 2014-04-09 DIAGNOSIS — I889 Nonspecific lymphadenitis, unspecified: Secondary | ICD-10-CM | POA: Diagnosis not present

## 2014-04-09 DIAGNOSIS — K031 Abrasion of teeth: Secondary | ICD-10-CM | POA: Diagnosis not present

## 2014-05-26 ENCOUNTER — Other Ambulatory Visit: Payer: Self-pay | Admitting: Internal Medicine

## 2014-06-16 ENCOUNTER — Telehealth: Payer: Self-pay | Admitting: Internal Medicine

## 2014-06-16 DIAGNOSIS — Z13820 Encounter for screening for osteoporosis: Secondary | ICD-10-CM

## 2014-06-16 NOTE — Telephone Encounter (Signed)
DEX ordered to be done at [facility]  rx printed

## 2014-06-16 NOTE — Telephone Encounter (Addendum)
Pt request to have a bone density test at [facility]. Please advise. msn

## 2014-06-16 NOTE — Telephone Encounter (Signed)
Please advise can put in for bone density.

## 2014-06-30 ENCOUNTER — Telehealth: Payer: Self-pay | Admitting: Internal Medicine

## 2014-06-30 ENCOUNTER — Ambulatory Visit (INDEPENDENT_AMBULATORY_CARE_PROVIDER_SITE_OTHER): Payer: Medicare Other | Admitting: Internal Medicine

## 2014-06-30 VITALS — BP 150/88 | HR 79 | Temp 98.1°F | Resp 14 | Ht 63.0 in | Wt 123.5 lb

## 2014-06-30 DIAGNOSIS — E559 Vitamin D deficiency, unspecified: Secondary | ICD-10-CM | POA: Diagnosis not present

## 2014-06-30 DIAGNOSIS — E038 Other specified hypothyroidism: Secondary | ICD-10-CM | POA: Diagnosis not present

## 2014-06-30 DIAGNOSIS — Z1159 Encounter for screening for other viral diseases: Secondary | ICD-10-CM

## 2014-06-30 DIAGNOSIS — M545 Low back pain, unspecified: Secondary | ICD-10-CM

## 2014-06-30 DIAGNOSIS — I10 Essential (primary) hypertension: Secondary | ICD-10-CM

## 2014-06-30 DIAGNOSIS — M546 Pain in thoracic spine: Secondary | ICD-10-CM

## 2014-06-30 DIAGNOSIS — E034 Atrophy of thyroid (acquired): Secondary | ICD-10-CM

## 2014-06-30 DIAGNOSIS — Z79899 Other long term (current) drug therapy: Secondary | ICD-10-CM | POA: Diagnosis not present

## 2014-06-30 DIAGNOSIS — Z13820 Encounter for screening for osteoporosis: Secondary | ICD-10-CM

## 2014-06-30 DIAGNOSIS — M8580 Other specified disorders of bone density and structure, unspecified site: Secondary | ICD-10-CM | POA: Diagnosis not present

## 2014-06-30 DIAGNOSIS — F411 Generalized anxiety disorder: Secondary | ICD-10-CM

## 2014-06-30 LAB — VITAMIN D 25 HYDROXY (VIT D DEFICIENCY, FRACTURES): VITD: 32.92 ng/mL (ref 30.00–100.00)

## 2014-06-30 LAB — LIPID PANEL
Cholesterol: 160 mg/dL (ref 0–200)
HDL: 62 mg/dL (ref >39.00)
LDL Cholesterol: 88 mg/dL (ref 0–99)
NONHDL: 98
TRIGLYCERIDES: 51 mg/dL (ref 0.0–149.0)
Total CHOL/HDL Ratio: 3
VLDL: 10.2 mg/dL (ref 0.0–40.0)

## 2014-06-30 LAB — COMPREHENSIVE METABOLIC PANEL
ALT: 21 U/L (ref 0–35)
AST: 29 U/L (ref 0–37)
Albumin: 4 g/dL (ref 3.5–5.2)
Alkaline Phosphatase: 46 U/L (ref 39–117)
BUN: 12 mg/dL (ref 6–23)
CO2: 29 meq/L (ref 19–32)
CREATININE: 0.83 mg/dL (ref 0.40–1.20)
Calcium: 9.8 mg/dL (ref 8.4–10.5)
Chloride: 97 mEq/L (ref 96–112)
GFR: 86.19 mL/min (ref >60.00)
GLUCOSE: 88 mg/dL (ref 70–99)
Potassium: 4.8 mEq/L (ref 3.5–5.1)
Sodium: 131 mEq/L — ABNORMAL LOW (ref 135–145)
TOTAL PROTEIN: 8.2 g/dL (ref 6.0–8.3)
Total Bilirubin: 0.3 mg/dL (ref 0.2–1.2)

## 2014-06-30 MED ORDER — ESCITALOPRAM OXALATE 5 MG PO TABS
5.0000 mg | ORAL_TABLET | Freq: Every day | ORAL | Status: DC
Start: 2014-06-30 — End: 2014-12-01

## 2014-06-30 MED ORDER — LEVOTHYROXINE SODIUM 50 MCG PO TABS
ORAL_TABLET | ORAL | Status: DC
Start: 2014-06-30 — End: 2014-12-11

## 2014-06-30 NOTE — Telephone Encounter (Signed)
emmi mailed  °

## 2014-06-30 NOTE — Telephone Encounter (Signed)
Pt returned to office complaining that the medication that Dr. Derrel Nip request for her to start was not sent to the [facility]. Pt was advise to start Metoprolol 25mg . There is no order for Metoprolol in system. Please advise pt.msn
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
## 2014-06-30 NOTE — Patient Instructions (Addendum)
Your last wellness exam was July 24 2013, so you should make an appt for htat in one month  Your blood pressure is elevated,  And since you are having rapid heart beats in the morning,  I am going to start you on Metoprolol  25 mg (very low dose).  Take it once daily in the evening or at bedtime  Do not start the lexapro for you depression until we are sure you are tolerating the metoprolol   You can continue the tramdol for pian,  Try adding SalonPas patches for topical relief of pain

## 2014-07-01 ENCOUNTER — Telehealth: Payer: Self-pay | Admitting: Internal Medicine

## 2014-07-01 LAB — HEPATITIS C ANTIBODY: HCV Ab: NEGATIVE

## 2014-07-01 MED ORDER — METOPROLOL SUCCINATE ER 25 MG PO TB24: 25.0000 mg | ORAL_TABLET | Freq: Every day | ORAL | Status: DC

## 2014-07-01 NOTE — Telephone Encounter (Signed)
Was notified patient script for Metoprolol was not at pharmacy advised MD and script sent electrronically, called and notified patient script sent.

## 2014-07-03 ENCOUNTER — Encounter: Payer: Self-pay | Admitting: Internal Medicine

## 2014-07-03 DIAGNOSIS — M546 Pain in thoracic spine: Secondary | ICD-10-CM

## 2014-07-03 DIAGNOSIS — M545 Low back pain: Secondary | ICD-10-CM | POA: Insufficient documentation

## 2014-07-03 NOTE — Assessment & Plan Note (Signed)
Recommended tiral of lexapro to start after BP medication trial.  5 mg daily
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
## 2014-07-03 NOTE — Assessment & Plan Note (Signed)
Elevated today.  Reviewed list of meds, patient is not taking OTC meds that could be causing it.  Adding metoprolol Succinate 25 mg daily qhs

## 2014-07-04 ENCOUNTER — Encounter: Payer: Self-pay | Admitting: *Deleted

## 2014-07-11 ENCOUNTER — Other Ambulatory Visit: Payer: Self-pay | Admitting: Internal Medicine

## 2014-07-11 NOTE — Telephone Encounter (Signed)
Last OV 3.31.16, last 90 day refill 2.20.16.  Please advise refill

## 2014-07-13 NOTE — Telephone Encounter (Signed)
Patient walked in requesting Rx refill on tramadol.  States that she called [facility] 2 days ago that corresponds to note on 4/11.  Please advise refill? thanks

## 2014-07-14 MED ORDER — TRAMADOL HCL 50 MG PO TABS
50.0000 mg | ORAL_TABLET | Freq: Two times a day (BID) | ORAL | Status: DC | PRN
Start: 2014-07-14 — End: 2014-12-21

## 2014-07-14 NOTE — Telephone Encounter (Signed)
Was mistakenly reported to MD that medication was filled 2/16, medication verified through pharmacy not filled since 12/15, reprinted rX signed by md notified patient script faxed to pharmacy.

## 2014-07-14 NOTE — Addendum Note (Signed)
Addended by: Nanci Pina on: 07/14/2014 04:10 PM   Modules accepted: Orders

## 2014-07-14 NOTE — Telephone Encounter (Signed)
Ok to refill,  printed rx  

## 2014-07-14 NOTE — Telephone Encounter (Signed)
She is requesting too early.  The February refill was for 180 tablets was for 90 days ,

## 2014-08-02 ENCOUNTER — Encounter: Payer: Self-pay | Admitting: Internal Medicine

## 2014-08-04 ENCOUNTER — Encounter: Payer: Self-pay | Admitting: Internal Medicine

## 2014-08-04 ENCOUNTER — Ambulatory Visit (INDEPENDENT_AMBULATORY_CARE_PROVIDER_SITE_OTHER): Payer: Medicare Other | Admitting: Internal Medicine

## 2014-08-04 VITALS — BP 140/74 | HR 61 | Temp 98.4°F | Resp 14 | Ht 63.0 in | Wt 125.5 lb

## 2014-08-04 DIAGNOSIS — F411 Generalized anxiety disorder: Secondary | ICD-10-CM

## 2014-08-04 DIAGNOSIS — M545 Low back pain: Secondary | ICD-10-CM

## 2014-08-04 DIAGNOSIS — I10 Essential (primary) hypertension: Secondary | ICD-10-CM

## 2014-08-04 DIAGNOSIS — M546 Pain in thoracic spine: Secondary | ICD-10-CM

## 2014-08-04 MED ORDER — ALPRAZOLAM 0.25 MG PO TABS: 0.2500 mg | ORAL_TABLET | Freq: Two times a day (BID) | ORAL | Status: DC | PRN

## 2014-08-04 MED ORDER — AMLODIPINE BESYLATE 10 MG PO TABS: 10.0000 mg | ORAL_TABLET | Freq: Every day | ORAL | Status: DC

## 2014-08-04 NOTE — Progress Notes (Signed)
Pre-visit discussion using our clinic review tool. No additional management support is needed unless otherwise documented below in the visit note.  
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
## 2014-08-04 NOTE — Patient Instructions (Addendum)
Your blood pressure has improved. Continue the metoprolol .  You can take the metoprolol with your amlodipine in the morning AFTER your thyroid medication  You can try the lexapro  In the evening.  Over time if tolerated,  You may find that you no longer need the morning or the evening alprazola,m   If you want to try an anti inflammatory  For your back  Muscles, let me know.

## 2014-08-06 ENCOUNTER — Encounter: Payer: Self-pay | Admitting: Internal Medicine

## 2014-08-06 NOTE — Assessment & Plan Note (Addendum)
I have again recommended that she consider SSRI therapy with lexapro to reduce her use of alprazolam to once daily she is finally willing to try the medication.

## 2014-08-06 NOTE — Assessment & Plan Note (Signed)
Well controlled with addition of metoprolol.  no changes today.
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
## 2014-08-11 ENCOUNTER — Encounter: Payer: Self-pay | Admitting: *Deleted

## 2014-08-11 DIAGNOSIS — M85862 Other specified disorders of bone density and structure, left lower leg: Secondary | ICD-10-CM | POA: Diagnosis not present

## 2014-08-11 DIAGNOSIS — Z13820 Encounter for screening for osteoporosis: Secondary | ICD-10-CM | POA: Diagnosis not present

## 2014-08-11 DIAGNOSIS — M85859 Other specified disorders of bone density and structure, unspecified thigh: Secondary | ICD-10-CM | POA: Diagnosis not present

## 2014-08-11 DIAGNOSIS — M810 Age-related osteoporosis without current pathological fracture: Secondary | ICD-10-CM | POA: Diagnosis not present

## 2014-08-11 LAB — HM DEXA SCAN

## 2014-08-12 ENCOUNTER — Telehealth: Payer: Self-pay | Admitting: *Deleted

## 2014-08-12 NOTE — Telephone Encounter (Signed)
Pt notified and  verbalized understanding. States she will discuss at her July appt, declined to schedule earlier appt.

## 2014-08-12 NOTE — Telephone Encounter (Signed)
Left message for pt to return my call.

## 2014-08-12 NOTE — Telephone Encounter (Signed)
Per Dr. Derrel Nip: She has osteoporosis by recent bone density scan done May 2016   Bone Density scores received, she has bone loss which is significant And indicative for osteoporosis. I recommend treating with medication but would need to discuss the pros and cons in an office visit . Increase calcium to 1800 mg daily through diet and supplements , 2000 units of vitamin d and weight bearing exercise on a regular basis, and make appt to discuss    I recommend getting the majority of your calcium and Vitamin D through diet rather than supplements given the recent association of calcium supplements with increased coronary artery calcium scores  Try the Silk almond Light, Original formula. Delicious, Low carb, Low cal, Cholesterol free

## 2014-10-13 ENCOUNTER — Ambulatory Visit: Payer: Medicare Other | Admitting: Internal Medicine

## 2014-11-08 ENCOUNTER — Ambulatory Visit (INDEPENDENT_AMBULATORY_CARE_PROVIDER_SITE_OTHER): Payer: Medicare Other | Admitting: *Deleted

## 2014-11-08 DIAGNOSIS — Z111 Encounter for screening for respiratory tuberculosis: Secondary | ICD-10-CM

## 2014-11-10 ENCOUNTER — Ambulatory Visit: Payer: Medicare Other | Admitting: Internal Medicine

## 2014-11-10 ENCOUNTER — Ambulatory Visit: Payer: Medicare Other

## 2014-11-10 DIAGNOSIS — Z111 Encounter for screening for respiratory tuberculosis: Secondary | ICD-10-CM

## 2014-11-10 LAB — TB SKIN TEST
INDURATION: 0 mm
TB SKIN TEST: NEGATIVE

## 2014-11-29 ENCOUNTER — Ambulatory Visit: Payer: Medicare Other | Admitting: Internal Medicine

## 2014-12-01 ENCOUNTER — Ambulatory Visit (INDEPENDENT_AMBULATORY_CARE_PROVIDER_SITE_OTHER): Payer: Medicare Other | Admitting: Internal Medicine

## 2014-12-01 ENCOUNTER — Encounter: Payer: Self-pay | Admitting: Internal Medicine

## 2014-12-01 VITALS — BP 136/72 | HR 69 | Temp 98.7°F | Resp 12 | Ht 63.0 in | Wt 123.5 lb

## 2014-12-01 DIAGNOSIS — Z23 Encounter for immunization: Secondary | ICD-10-CM | POA: Diagnosis not present

## 2014-12-01 DIAGNOSIS — F411 Generalized anxiety disorder: Secondary | ICD-10-CM

## 2014-12-01 DIAGNOSIS — M810 Age-related osteoporosis without current pathological fracture: Secondary | ICD-10-CM | POA: Diagnosis not present

## 2014-12-01 DIAGNOSIS — D508 Other iron deficiency anemias: Secondary | ICD-10-CM

## 2014-12-01 DIAGNOSIS — E559 Vitamin D deficiency, unspecified: Secondary | ICD-10-CM

## 2014-12-01 DIAGNOSIS — R5383 Other fatigue: Secondary | ICD-10-CM | POA: Diagnosis not present

## 2014-12-01 DIAGNOSIS — I10 Essential (primary) hypertension: Secondary | ICD-10-CM

## 2014-12-01 DIAGNOSIS — Z0000 Encounter for general adult medical examination without abnormal findings: Secondary | ICD-10-CM

## 2014-12-01 DIAGNOSIS — Z79899 Other long term (current) drug therapy: Secondary | ICD-10-CM

## 2014-12-01 LAB — IRON AND TIBC
%SAT: 20 % (ref 11–50)
Iron: 67 ug/dL (ref 45–160)
TIBC: 338 ug/dL (ref 250–450)
UIBC: 271 ug/dL (ref 125–400)

## 2014-12-01 NOTE — Progress Notes (Addendum)
Patient ID: Holly Perez, female    DOB: July 26, 1940  Age: 74 y.o. MRN: [id number]  The patient is here for annual Medicare wellness examination and management of other chronic and acute problems.   Upset that her appointment was reschedule twice .    Has osteoporosis by spine scores may 2016 [facility] DEXA Mammo Nov 2015  Colonoscopy 2011 found a sessile polyp, repeat due 2014 . Dr Bary Castilla is managing her repeat colonoscopy   Feeling better since prescribed the metoprolol .  BP reading at home have been 132/80  The risk factors are reflected in the social history.  The roster of all physicians providing medical care to patient - is listed in the Snapshot section of the chart.  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 They wear seatbelts.  There are no firearms at home. There is no violence in the home.   There is no risks for hepatitis, STDs or HIV. There is no   history of blood transfusion. They have no travel history to infectious disease endemic areas of the world.  The patient has seen their dentist in the last six month. They have seen their eye doctor in the last year. They admit to slight hearing difficulty with regard to whispered voices and some television programs.  They have deferred audiologic testing in the last year.  They do not  have excessive sun exposure. Discussed the need for sun protection: hats, long sleeves and use of sunscreen if there is significant sun exposure.   Diet: the importance of a healthy diet is discussed. They do have a healthy diet.  The benefits of regular aerobic exercise were discussed. She walks 4 times per week ,  20 minutes.   Depression screen: there are no signs or vegative symptoms of depression-  irritability, change in appetite, anhedonia, sadness/tearfullness.  Cognitive assessment: the patient manages all their financial and personal affairs and is actively engaged. They could relate day,date,year and events; recalled 2/3 objects at 3 minutes; performed clock-face test normally.  The following portions of the patient's history were reviewed and updated as appropriate: allergies, current medications, past family history, past medical history,  past surgical history, past social history  and problem list.  Visual acuity was not assessed per patient preference since she has regular follow up with her ophthalmologist. Hearing and body mass index were assessed and reviewed.   During the course of the visit the patient was educated and counseled about appropriate screening and preventive services including : fall prevention , diabetes screening, nutrition counseling, colorectal cancer screening, and recommended immunizations.    CC: The primary encounter diagnosis was Other iron deficiency anemias. Diagnoses of Long-term use of high-risk medication, Other fatigue, Vitamin D deficiency, Encounter for immunization, Medicare annual wellness visit, subsequent, Generalized anxiety disorder, Osteoporosis, postmenopausal, and Essential hypertension were also pertinent to this visit.  History Holly Perez has a past medical history of Asthma; Colon polyp; Irritable bowel syndrome (IBS); Hypertension; and Hemorrhoids.   She has past surgical history that includes Ectopic pregnancy surgery; Abdominal hysterectomy; Ovary surgery (1974); and Colonoscopy (2011).   Her family history includes COPD in her father; Early death in her brother and mother.She reports that she has never  smoked. She has never used smokeless tobacco. She reports that she does not drink alcohol or use illicit drugs.  Outpatient Prescriptions Prior to Visit  Medication Sig Dispense Refill  . albuterol (PROVENTIL HFA;VENTOLIN HFA) 108  (90 BASE) MCG/ACT inhaler Inhale into the lungs every 6 (six) hours as needed for wheezing or shortness of breath.    Marland Kitchen alendronate (FOSAMAX) 70 MG tablet Take 1 tablet by mouth every 7 days with a full glass of water on an empty stomach. 4 tablet 10  . ALPRAZolam (XANAX) 0.25 MG tablet Take 1 tablet (0.25 mg total) by mouth 2 (two) times daily as needed for anxiety. 60 tablet 4  . amLODipine (NORVASC) 10 MG tablet Take 1 tablet (10 mg total) by mouth daily. 90 tablet 2  . aspirin 81 MG tablet Take 81 mg by mouth daily.    . Calcium Carbonate-Vitamin D (CALTRATE 600+D PO) Take 1,200 mg by mouth daily.     . cholecalciferol (VITAMIN D) 1000 UNITS tablet Take 2,000 Units by mouth daily.    . fish oil-omega-3 fatty acids 1000 MG capsule Take 1 g by mouth daily.     . Fluticasone-Salmeterol (ADVAIR) 250-50 MCG/DOSE AEPB Inhale 1 puff into the lungs every 12 (twelve) hours.    Marland Kitchen levothyroxine (SYNTHROID, LEVOTHROID) 50 MCG tablet Take 1 tablet by mouth daily before breakfast. 90 tablet 1  . loratadine (CLARITIN) 10 MG tablet Take 10 mg by mouth daily as needed.    . metoprolol succinate (TOPROL-XL) 25 MG 24 hr tablet Take 1 tablet (25 mg total) by mouth daily. 90 tablet 3  . Multiple Vitamin (MULTIVITAMIN) tablet Take 1 tablet by mouth daily.    . traMADol (ULTRAM) 50 MG tablet Take 1 tablet (50 mg total) by mouth 2 (two) times daily as needed. 180 tablet 1  . vitamin E 100 UNIT capsule Take 100 Units by mouth daily.    Marland Kitchen escitalopram (LEXAPRO) 5 MG tablet Take 1 tablet (5 mg total) by mouth daily. (Patient not taking: Reported on 08/04/2014) 30 tablet 2   No facility-administered medications prior to visit.    Review of Systems   Patient denies headache, fevers, malaise, unintentional weight loss, skin rash, eye pain, sinus congestion and sinus pain, sore throat, dysphagia,  hemoptysis , cough, dyspnea, wheezing, chest pain, palpitations, orthopnea, edema, abdominal pain, nausea, melena, diarrhea,  constipation, flank pain, dysuria, hematuria, urinary  Frequency, nocturia, numbness, tingling, seizures,  Focal weakness, Loss of consciousness,  Tremor, insomnia, depression, anxiety, and suicidal ideation.      Objective:  BP 136/72 mmHg  Pulse 69  Temp(Src) 98.7 F (37.1 C) (Oral)  Resp 12  Ht 5\' 3"  (1.6 m)  Wt 123 lb 8 oz (56.019 kg)  BMI 21.88 kg/m2  SpO2 96%  Physical Exam   General appearance: alert, cooperative and appears stated age Head: Normocephalic, without obvious abnormality, atraumatic Eyes: conjunctivae/corneas clear. PERRL, EOM's intact. Fundi benign. Ears: normal TM's and external ear canals both ears Nose: Nares normal. Septum midline. Mucosa normal. No drainage or sinus tenderness. Throat: lips, mucosa, and tongue normal; teeth and gums normal Neck: no adenopathy, no carotid bruit, no JVD, supple, symmetrical, trachea midline and thyroid not enlarged, symmetric, no tenderness/mass/nodules Lungs: clear to auscultation bilaterally Breasts: normal appearance, no masses or tenderness Heart: regular rate and rhythm, S1, S2 normal, no murmur, click, rub or gallop Abdomen: soft, non-tender; bowel sounds normal; no masses,  no organomegaly Extremities: extremities normal, atraumatic, no cyanosis or edema Pulses: 2+  and symmetric Skin: Skin color, texture, turgor normal. No rashes or lesions Neurologic: Alert and oriented X 3, normal strength and tone. Normal symmetric reflexes. Normal coordination and gait.     Assessment & Plan:   Problem List Items Addressed This Visit      Unprioritized   Osteoporosis, postmenopausal    Patient concerned about developing OCN Jaw due to the recent experience of a friend,,  Discussed actual incidence of OCN,  Alternative options for treatment.  Patient opted to continue current medication       Medicare annual wellness visit, subsequent    Annual Medicare wellness  exam was done as well as a comprehensive physical exam and  management of acute and chronic conditions .  During the course of the visit the patient was educated and counseled about appropriate screening and preventive services including : fall prevention , diabetes screening, nutrition counseling, colorectal cancer screening, and recommended immunizations.  Printed recommendations for health maintenance screenings was given.       Generalized anxiety disorder    Significant,  patient again  did not tolerate trial of SSRI with  5 mg lexapro        Essential hypertension    Aggravated by anxiety, which she contineus to treat only with alprazolam.  BP Inmproved today with addition o f metoprolol       Other Visit Diagnoses    Other iron deficiency anemias    -  Primary    Relevant Orders    CBC with Differential/Platelet (Completed)    Iron and TIBC (Completed)    Ferritin (Completed)    Vitamin B12 (Completed)    Long-term use of high-risk medication        Relevant Orders    Comprehensive metabolic panel (Completed)    Other fatigue        Relevant Orders    TSH (Completed)    Vitamin D deficiency        Relevant Orders    Vit D  25 hydroxy (rtn osteoporosis monitoring) (Completed)    Encounter for immunization           I have discontinued Holly Perez's escitalopram. I am also having her maintain her aspirin, Fluticasone-Salmeterol, cholecalciferol, Calcium Carbonate-Vitamin D (CALTRATE 600+D PO), multivitamin, vitamin E, fish oil-omega-3 fatty acids, loratadine, albuterol, alendronate, levothyroxine, metoprolol succinate, traMADol, ALPRAZolam, and amLODipine.  No orders of the defined types were placed in this encounter.    Medications Discontinued During This Encounter  Medication Reason  . escitalopram (LEXAPRO) 5 MG tablet Patient Preference    Follow-up: No Follow-up on file.   Crecencio Mc, MD

## 2014-12-01 NOTE — Patient Instructions (Addendum)
I recommend getting  2/3  Or the majority of your calcium through diet and only 1/3 thhough supplements given the recent association of calcium supplements with increased coronary artery calcium scores  Try the almond and cashew milks that most grocery stores  now carry  in the dairy  Section of most grocery stores.  The are loaded with calcium , cholesterol free and lactose free:  [employer] brand Almond Light,  Original formula.  Delicious,  Low carb,  Low cal,  Cholesterol free   I recommend that you continue the Fosamax at the prescribed dose for your osteoporosis,  Because the benefits are not realized at lower doses.  Health Maintenance Adopting a healthy lifestyle and getting preventive care can go a long way to promote health and wellness. Talk with your health care provider about what schedule of regular examinations is right for you. This is a good chance for you to check in with your provider about disease prevention and staying healthy. In between checkups, there are plenty of things you can do on your own. Experts have done a lot of research about which lifestyle changes and preventive measures are most likely to keep you healthy. Ask your health care provider for more information. WEIGHT AND DIET  Eat a healthy diet  Be sure to include plenty of vegetables, fruits, low-fat dairy products, and lean protein.  Do not eat a lot of foods high in solid fats, added sugars, or salt.  Get regular exercise. This is one of the most important things you can do for your health.  Most adults should exercise for at least 150 minutes each week. The exercise should increase your heart rate and make you sweat (moderate-intensity exercise).  Most adults should also do strengthening exercises at least twice a week. This is in addition to the moderate-intensity exercise.  Maintain a healthy weight  Body mass index (BMI) is a measurement that can be used to identify possible weight problems. It estimates  body fat based on height and weight. Your health care provider can help determine your BMI and help you achieve or maintain a healthy weight.  For females 13 years of age and older:   A BMI below 18.5 is considered underweight.  A BMI of 18.5 to 24.9 is normal.  A BMI of 25 to 29.9 is considered overweight.  A BMI of 30 and above is considered obese.  Watch levels of cholesterol and blood lipids  You should start having your blood tested for lipids and cholesterol at 75 years of age, then have this test every 5 years.  You may need to have your cholesterol levels checked more often if:  Your lipid or cholesterol levels are high.  You are older than 75 years of age.  You are at high risk for heart disease.  CANCER SCREENING   Lung Cancer  Lung cancer screening is recommended for adults 97-71 years old who are at high risk for lung cancer because of a history of smoking.  A yearly low-dose CT scan of the lungs is recommended for people who:  Currently smoke.  Have quit within the past 15 years.  Have at least a 30-pack-year history of smoking. A pack year is smoking an average of one pack of cigarettes a day for 1 year.  Yearly screening should continue until it has been 15 years since you quit.  Yearly screening should stop if you develop a health problem that would prevent you from having lung cancer treatment.  Breast Cancer  Practice breast self-awareness. This means understanding how your breasts normally appear and feel.  It also means doing regular breast self-exams. Let your health care provider know about any changes, no matter how small.  If you are in your 20s or 30s, you should have a clinical breast exam (CBE) by a health care provider every 1-3 years as part of a regular health exam.  If you are 82 or older, have a CBE every year. Also consider having a breast X-ray (mammogram) every year.  If you have a family history of breast cancer, talk to your  health care provider about genetic screening.  If you are at high risk for breast cancer, talk to your health care provider about having an MRI and a mammogram every year.  Breast cancer gene (BRCA) assessment is recommended for women who have family members with BRCA-related cancers. BRCA-related cancers include:  Breast.  Ovarian.  Tubal.  Peritoneal cancers.  Results of the assessment will determine the need for genetic counseling and BRCA1 and BRCA2 testing. Cervical Cancer Routine pelvic examinations to screen for cervical cancer are no longer recommended for nonpregnant women who are considered low risk for cancer of the pelvic organs (ovaries, uterus, and vagina) and who do not have symptoms. A pelvic examination may be necessary if you have symptoms including those associated with pelvic infections. Ask your health care provider if a screening pelvic exam is right for you.   The Pap test is the screening test for cervical cancer for women who are considered at risk.  If you had a hysterectomy for a problem that was not cancer or a condition that could lead to cancer, then you no longer need Pap tests.  If you are older than 65 years, and you have had normal Pap tests for the past 10 years, you no longer need to have Pap tests.  If you have had past treatment for cervical cancer or a condition that could lead to cancer, you need Pap tests and screening for cancer for at least 20 years after your treatment.  If you no longer get a Pap test, assess your risk factors if they change (such as having a new sexual partner). This can affect whether you should start being screened again.  Some women have medical problems that increase their chance of getting cervical cancer. If this is the case for you, your health care provider may recommend more frequent screening and Pap tests.  The human papillomavirus (HPV) test is another test that may be used for cervical cancer screening. The HPV  test looks for the virus that can cause cell changes in the cervix. The cells collected during the Pap test can be tested for HPV.  The HPV test can be used to screen women 66 years of age and older. Getting tested for HPV can extend the interval between normal Pap tests from three to five years.  An HPV test also should be used to screen women of any age who have unclear Pap test results.  After 75 years of age, women should have HPV testing as often as Pap tests.  Colorectal Cancer  This type of cancer can be detected and often prevented.  Routine colorectal cancer screening usually begins at 75 years of age and continues through 75 years of age.  Your health care provider may recommend screening at an earlier age if you have risk factors for colon cancer.  Your health care provider may also recommend using  home test kits to check for hidden blood in the stool.  A small camera at the end of a tube can be used to examine your colon directly (sigmoidoscopy or colonoscopy). This is done to check for the earliest forms of colorectal cancer.  Routine screening usually begins at age 61.  Direct examination of the colon should be repeated every 5-10 years through 75 years of age. However, you may need to be screened more often if early forms of precancerous polyps or small growths are found. Skin Cancer  Check your skin from head to toe regularly.  Tell your health care provider about any new moles or changes in moles, especially if there is a change in a mole's shape or color.  Also tell your health care provider if you have a mole that is larger than the size of a pencil eraser.  Always use sunscreen. Apply sunscreen liberally and repeatedly throughout the day.  Protect yourself by wearing long sleeves, pants, a wide-brimmed hat, and sunglasses whenever you are outside. HEART DISEASE, DIABETES, AND HIGH BLOOD PRESSURE   Have your blood pressure checked at least every 1-2 years. High  blood pressure causes heart disease and increases the risk of stroke.  If you are between 24 years and 25 years old, ask your health care provider if you should take aspirin to prevent strokes.  Have regular diabetes screenings. This involves taking a blood sample to check your fasting blood sugar level.  If you are at a normal weight and have a low risk for diabetes, have this test once every three years after 75 years of age.  If you are overweight and have a high risk for diabetes, consider being tested at a younger age or more often. PREVENTING INFECTION  Hepatitis B  If you have a higher risk for hepatitis B, you should be screened for this virus. You are considered at high risk for hepatitis B if:  You were born in a country where hepatitis B is common. Ask your health care provider which countries are considered high risk.  Your parents were born in a high-risk country, and you have not been immunized against hepatitis B (hepatitis B vaccine).  You have HIV or AIDS.  You use needles to inject street drugs.  You live with someone who has hepatitis B.  You have had sex with someone who has hepatitis B.  You get hemodialysis treatment.  You take certain medicines for conditions, including cancer, organ transplantation, and autoimmune conditions. Hepatitis C  Blood testing is recommended for:  Everyone born from [birth year] through 1966.  Anyone with known risk factors for hepatitis C. Sexually transmitted infections (STIs)  You should be screened for sexually transmitted infections (STIs) including gonorrhea and chlamydia if:  You are sexually active and are younger than 74 years of age.  You are older than 75 years of age and your health care provider tells you that you are at risk for this type of infection.  Your sexual activity has changed since you were last screened and you are at an increased risk for chlamydia or gonorrhea. Ask your health care provider if you are at  risk.  If you do not have HIV, but are at risk, it may be recommended that you take a prescription medicine daily to prevent HIV infection. This is called pre-exposure prophylaxis (PrEP). You are considered at risk if:  You are sexually active and do not regularly use condoms or know the HIV status of your  partner(s).  You take drugs by injection.  You are sexually active with a partner who has HIV. Talk with your health care provider about whether you are at high risk of being infected with HIV. If you choose to begin PrEP, you should first be tested for HIV. You should then be tested every 3 months for as long as you are taking PrEP.  PREGNANCY   If you are premenopausal and you may become pregnant, ask your health care provider about preconception counseling.  If you may become pregnant, take 400 to 800 micrograms (mcg) of folic acid every day.  If you want to prevent pregnancy, talk to your health care provider about birth control (contraception). OSTEOPOROSIS AND MENOPAUSE   Osteoporosis is a disease in which the bones lose minerals and strength with aging. This can result in serious bone fractures. Your risk for osteoporosis can be identified using a bone density scan.  If you are 79 years of age or older, or if you are at risk for osteoporosis and fractures, ask your health care provider if you should be screened.  Ask your health care provider whether you should take a calcium or vitamin D supplement to lower your risk for osteoporosis.  Menopause may have certain physical symptoms and risks.  Hormone replacement therapy may reduce some of these symptoms and risks. Talk to your health care provider about whether hormone replacement therapy is right for you.  HOME CARE INSTRUCTIONS   Schedule regular health, dental, and eye exams.  Stay current with your immunizations.   Do not use any tobacco products including cigarettes, chewing tobacco, or electronic cigarettes.  If  you are pregnant, do not drink alcohol.  If you are breastfeeding, limit how much and how often you drink alcohol.  Limit alcohol intake to no more than 1 drink per day for nonpregnant women. One drink equals 12 ounces of beer, 5 ounces of wine, or 1 ounces of hard liquor.  Do not use street drugs.  Do not share needles.  Ask your health care provider for help if you need support or information about quitting drugs.  Tell your health care provider if you often feel depressed.  Tell your health care provider if you have ever been abused or do not feel safe at home. Document Released: 10/01/2010 Document Revised: 08/02/2013 Document Reviewed: 02/17/2013 [facility] Patient Information 2015 [place], [place]. This information is not intended to replace advice given to you by your health care provider. Make sure you discuss any questions you have with your health care provider.

## 2014-12-01 NOTE — Progress Notes (Signed)
Pre-visit discussion using our clinic review tool. No additional management support is needed unless otherwise documented below in the visit note.  

## 2014-12-02 LAB — COMPREHENSIVE METABOLIC PANEL
ALK PHOS: 45 U/L (ref 39–117)
ALT: 22 U/L (ref 0–35)
AST: 34 U/L (ref 0–37)
Albumin: 4.2 g/dL (ref 3.5–5.2)
BILIRUBIN TOTAL: 0.3 mg/dL (ref 0.2–1.2)
BUN: 15 mg/dL (ref 6–23)
CO2: 29 meq/L (ref 19–32)
CREATININE: 0.89 mg/dL (ref 0.40–1.20)
Calcium: 9.7 mg/dL (ref 8.4–10.5)
Chloride: 97 mEq/L (ref 96–112)
GFR: 79.43 mL/min (ref >60.00)
GLUCOSE: 65 mg/dL — AB (ref 70–99)
Potassium: 4.4 mEq/L (ref 3.5–5.1)
SODIUM: 132 meq/L — AB (ref 135–145)
TOTAL PROTEIN: 8.2 g/dL (ref 6.0–8.3)

## 2014-12-02 LAB — CBC WITH DIFFERENTIAL/PLATELET
BASOS PCT: 0.3 % (ref 0.0–3.0)
Basophils Absolute: 0 10*3/uL (ref 0.0–0.1)
Eosinophils Absolute: 0.2 10*3/uL (ref 0.0–0.7)
Eosinophils Relative: 3.9 % (ref 0.0–5.0)
HCT: 36.5 % (ref 36.0–46.0)
Hemoglobin: 12.1 g/dL (ref 12.0–15.0)
LYMPHS ABS: 2 10*3/uL (ref 0.7–4.0)
Lymphocytes Relative: 40 % (ref 12.0–46.0)
MCHC: 33.1 g/dL (ref 30.0–36.0)
MCV: 88.8 fl (ref 78.0–100.0)
MONO ABS: 0.3 10*3/uL (ref 0.1–1.0)
MONOS PCT: 7.1 % (ref 3.0–12.0)
NEUTROS ABS: 2.4 10*3/uL (ref 1.4–7.7)
NEUTROS PCT: 48.7 % (ref 43.0–77.0)
Platelets: 366 10*3/uL (ref 150.0–400.0)
RBC: 4.11 Mil/uL (ref 3.87–5.11)
RDW: 13.8 % (ref 11.5–15.5)
WBC: 4.9 10*3/uL (ref 4.0–10.5)

## 2014-12-02 LAB — FERRITIN: Ferritin: 18.2 ng/mL (ref 10.0–291.0)

## 2014-12-02 LAB — VITAMIN B12: Vitamin B-12: 986 pg/mL — ABNORMAL HIGH (ref 211–911)

## 2014-12-02 LAB — VITAMIN D 25 HYDROXY (VIT D DEFICIENCY, FRACTURES): VITD: 46.66 ng/mL (ref 30.00–100.00)

## 2014-12-02 LAB — TSH: TSH: 2.01 u[IU]/mL (ref 0.35–4.50)

## 2014-12-03 NOTE — Assessment & Plan Note (Signed)
Patient concerned about developing OCN Jaw due to the recent experience of a friend,,  Discussed actual incidence of OCN,  Alternative options for treatment.  Patient opted to continue current medication

## 2014-12-03 NOTE — Assessment & Plan Note (Signed)
Significant,  patient again  did not tolerate trial of SSRI with  5 mg lexapro

## 2014-12-03 NOTE — Assessment & Plan Note (Signed)

## 2014-12-03 NOTE — Assessment & Plan Note (Addendum)
Aggravated by anxiety, which she contineus to treat only with alprazolam.  BP Inmproved today with addition o f metoprolol

## 2014-12-08 NOTE — Addendum Note (Signed)
Addended by: Crecencio Mc on: 12/08/2014 05:01 PM   Modules accepted: Miquel Dunn

## 2014-12-11 ENCOUNTER — Other Ambulatory Visit: Payer: Self-pay | Admitting: Internal Medicine

## 2014-12-12 ENCOUNTER — Ambulatory Visit: Payer: Self-pay | Admitting: General Surgery

## 2014-12-20 ENCOUNTER — Ambulatory Visit: Payer: Medicare Other | Admitting: General Surgery

## 2014-12-21 ENCOUNTER — Other Ambulatory Visit: Payer: Self-pay | Admitting: Internal Medicine

## 2014-12-21 NOTE — Telephone Encounter (Signed)
Last OV 5.5.16.  Please advise refill 

## 2014-12-22 NOTE — Telephone Encounter (Signed)
Rx faxed

## 2014-12-22 NOTE — Telephone Encounter (Signed)
90 day supply authorized and printed  

## 2015-01-19 ENCOUNTER — Telehealth: Payer: Self-pay | Admitting: *Deleted

## 2015-01-19 DIAGNOSIS — Z1239 Encounter for other screening for malignant neoplasm of breast: Secondary | ICD-10-CM

## 2015-01-19 NOTE — Telephone Encounter (Signed)
Ordered Holly Perez

## 2015-01-19 NOTE — Telephone Encounter (Signed)
Patient has requested a order for her mammogram.

## 2015-01-19 NOTE — Telephone Encounter (Signed)
Please advise 

## 2015-02-14 DIAGNOSIS — Z1231 Encounter for screening mammogram for malignant neoplasm of breast: Secondary | ICD-10-CM | POA: Diagnosis not present

## 2015-02-16 ENCOUNTER — Encounter: Payer: Self-pay | Admitting: *Deleted

## 2015-03-10 ENCOUNTER — Other Ambulatory Visit: Payer: Self-pay | Admitting: Internal Medicine

## 2015-03-29 ENCOUNTER — Encounter: Payer: Self-pay | Admitting: *Deleted

## 2015-04-11 ENCOUNTER — Other Ambulatory Visit: Payer: Self-pay

## 2015-04-11 ENCOUNTER — Telehealth: Payer: Self-pay | Admitting: Internal Medicine

## 2015-04-11 MED ORDER — ALPRAZOLAM 0.25 MG PO TABS: 0.2500 mg | ORAL_TABLET | Freq: Two times a day (BID) | ORAL | Status: DC | PRN

## 2015-04-11 NOTE — Telephone Encounter (Signed)
Refill request sent to Dr. Tullo 

## 2015-04-11 NOTE — Telephone Encounter (Signed)
Refill for 30 days only.  OFFICE VISIT NEEDED prior to any more refills 

## 2015-04-11 NOTE — Telephone Encounter (Signed)
Pt came into office and wanted to inform us that she has changed pharmacy to [facility] on [address]. (Please use the pharmacy for now on.) Pt would like her ALPRAZolam (XANAX) 0.25 MG tablet to be refilled. You can leave a message on her home # [phone].

## 2015-04-11 NOTE — Telephone Encounter (Signed)
Please advise? Patient seen in May 2016.

## 2015-04-14 NOTE — Telephone Encounter (Signed)
Left message for patient to return call to office, patient needs and OV for further refills.

## 2015-05-26 ENCOUNTER — Other Ambulatory Visit: Payer: Self-pay | Admitting: Internal Medicine

## 2015-05-30 ENCOUNTER — Telehealth: Payer: Self-pay | Admitting: *Deleted

## 2015-05-30 NOTE — Telephone Encounter (Signed)
Advised patient cal was not from me.

## 2015-05-30 NOTE — Telephone Encounter (Signed)
Patient stated that she missed a call from [facility], she stated that she will be at [phone] for the remainder of the day. She was not sure, of the reason of call.

## 2015-05-30 NOTE — Telephone Encounter (Signed)
Please advise, thanks.

## 2015-06-03 ENCOUNTER — Other Ambulatory Visit: Payer: Self-pay | Admitting: Internal Medicine

## 2015-06-05 NOTE — Telephone Encounter (Signed)
Refilled 04/2015 patient has a appointment on 06/15/15. Please advise?

## 2015-06-05 NOTE — Telephone Encounter (Signed)
.  Ok to refill,  Refill printed 

## 2015-06-08 DIAGNOSIS — H2513 Age-related nuclear cataract, bilateral: Secondary | ICD-10-CM | POA: Diagnosis not present

## 2015-06-15 ENCOUNTER — Other Ambulatory Visit: Payer: Self-pay

## 2015-06-15 ENCOUNTER — Ambulatory Visit (INDEPENDENT_AMBULATORY_CARE_PROVIDER_SITE_OTHER): Payer: Medicare Other | Admitting: Internal Medicine

## 2015-06-15 ENCOUNTER — Encounter: Payer: Self-pay | Admitting: Internal Medicine

## 2015-06-15 VITALS — BP 138/74 | HR 81 | Temp 98.0°F | Resp 12 | Ht 63.0 in | Wt 127.5 lb

## 2015-06-15 DIAGNOSIS — M810 Age-related osteoporosis without current pathological fracture: Secondary | ICD-10-CM | POA: Diagnosis not present

## 2015-06-15 DIAGNOSIS — E034 Atrophy of thyroid (acquired): Secondary | ICD-10-CM | POA: Diagnosis not present

## 2015-06-15 DIAGNOSIS — E871 Hypo-osmolality and hyponatremia: Secondary | ICD-10-CM

## 2015-06-15 DIAGNOSIS — E038 Other specified hypothyroidism: Secondary | ICD-10-CM

## 2015-06-15 DIAGNOSIS — F411 Generalized anxiety disorder: Secondary | ICD-10-CM

## 2015-06-15 DIAGNOSIS — I10 Essential (primary) hypertension: Secondary | ICD-10-CM

## 2015-06-15 DIAGNOSIS — J4520 Mild intermittent asthma, uncomplicated: Secondary | ICD-10-CM

## 2015-06-15 LAB — COMPREHENSIVE METABOLIC PANEL
ALK PHOS: 50 U/L (ref 39–117)
ALT: 22 U/L (ref 0–35)
AST: 29 U/L (ref 0–37)
Albumin: 4.1 g/dL (ref 3.5–5.2)
BILIRUBIN TOTAL: 0.5 mg/dL (ref 0.2–1.2)
BUN: 12 mg/dL (ref 6–23)
CO2: 27 meq/L (ref 19–32)
Calcium: 9.6 mg/dL (ref 8.4–10.5)
Chloride: 96 mEq/L (ref 96–112)
Creatinine, Ser: 0.82 mg/dL (ref 0.40–1.20)
GFR: 87.18 mL/min (ref >60.00)
GLUCOSE: 84 mg/dL (ref 70–99)
Potassium: 4.4 mEq/L (ref 3.5–5.1)
SODIUM: 131 meq/L — AB (ref 135–145)
Total Protein: 8 g/dL (ref 6.0–8.3)

## 2015-06-15 LAB — TSH: TSH: 1.73 u[IU]/mL (ref 0.35–4.50)

## 2015-06-15 MED ORDER — ALPRAZOLAM 0.25 MG PO TABS: ORAL_TABLET | ORAL | Status: DC

## 2015-06-15 NOTE — Telephone Encounter (Signed)
Patient was given a refill on her xanax by Dr Derrel Nip on 06/15/15

## 2015-06-15 NOTE — Patient Instructions (Addendum)
You are doing extremely well!  Your received the Pneumonia vaccine "Prevnar" today   Your cholesterol was excellent last year and does not need rechecking  We will repeat your bone Density test next year

## 2015-06-15 NOTE — Assessment & Plan Note (Signed)
Managed with alprazolam low dose in the evenings..  Does not tolerate dyatime dose for prn use even with panic.  Uses deep breathing and meditation,  intolerant of SSRI

## 2015-06-15 NOTE — Assessment & Plan Note (Addendum)
Has not been taking alendronate for the past 4 months because she remains concerned about developing OCN Jaw due to the recent experience of a friend, despite discussion of the  actual incidence of OCN. Does not want to start an alternative,  Exercising daily,  Taking Vit D and getting calcium 1800 mg daily .  Will repeat DEXA next year.

## 2015-06-15 NOTE — Progress Notes (Signed)
Pre-visit discussion using our clinic review tool. No additional management support is needed unless otherwise documented below in the visit note.  

## 2015-06-15 NOTE — Progress Notes (Signed)
5  Subjective:  Patient ID: Holly Perez, female    DOB: Jun 21, 1940  Age: 75 y.o. 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 Diagnoses of Hypothyroidism due to acquired atrophy of thyroid, Generalized anxiety disorder, Hyponatremia, Asthma, mild intermittent, uncomplicated, and Essential hypertension were also pertinent to this visit.  HPI JOPLYN TATAR presents for follow up on hypertension, osteoporosis  and  GAD  HTN:  Managed with amlodipine and metoprolol.  Home readings are normal range and no side effects from  Regular use of medications.  GAD  managed with alprazolam just at night.  Did not tolerate prn doses for feelings described as panic attacks.  Does not want to start an SSRI despite explanation of benefits.  Hypothyroidism:  Taking levothyroxine at the appropriate time. Needs level checked.   Osteoporosis:  Not Taking alendronate for the past 4 months.  Last DEXA 2016.  Lab Results  Component Value Date   TSH 1.73 06/15/2015     Had her mammogram , normal.    Outpatient Prescriptions Prior to Visit  Medication Sig Dispense Refill  . albuterol (PROVENTIL HFA;VENTOLIN HFA) 108 (90 BASE) MCG/ACT inhaler Inhale into the lungs every 6 (six) hours as needed for wheezing or shortness of breath.    Marland Kitchen amLODipine (NORVASC) 10 MG tablet Take 1 tablet (10 mg total) by mouth daily. 90 tablet 2  . aspirin 81 MG tablet Take 81 mg by mouth daily.    . Calcium Carbonate-Vitamin D (CALTRATE 600+D PO) Take 1,200 mg by mouth daily.     . cholecalciferol (VITAMIN D) 1000 UNITS tablet Take 2,000 Units by mouth daily.    . fish oil-omega-3 fatty acids 1000 MG capsule Take 1 g by mouth daily.     . Fluticasone-Salmeterol (ADVAIR) 250-50 MCG/DOSE AEPB Inhale 1 puff into the lungs every 12 (twelve) hours.    Marland Kitchen levothyroxine (SYNTHROID, LEVOTHROID) 50 MCG tablet TAKE ONE TABLET BY MOUTH ONE TIME DAILY BEFORE BREAKFAST 90 tablet 2  . loratadine  (CLARITIN) 10 MG tablet Take 10 mg by mouth daily as needed.    . metoprolol succinate (TOPROL-XL) 25 MG 24 hr tablet Take 1 tablet (25 mg total) by mouth daily. 90 tablet 3  . Multiple Vitamin (MULTIVITAMIN) tablet Take 1 tablet by mouth daily.    . traMADol (ULTRAM) 50 MG tablet TAKE ONE TABLET BY MOUTH TWICE DAILY AS NEEDED 180 tablet 1  . vitamin E 100 UNIT capsule Take 100 Units by mouth daily.    Marland Kitchen alendronate (FOSAMAX) 70 MG tablet TAKE ONE TABLET BY MOUTH WEEKLY WITH A FULL GLASS OF WATER ON AN EMPTY STOMACH 4 tablet 2  . ALPRAZolam (XANAX) 0.25 MG tablet TAKE 1 TABLET BY MOUTH TWICE DAILY FOR ANXIETY 60 tablet 5   No facility-administered medications prior to visit.    Review of Systems;  Patient denies headache, fevers, malaise, unintentional weight loss, skin rash, eye pain, sinus congestion and sinus pain, sore throat, dysphagia,  hemoptysis , cough, dyspnea, wheezing, chest pain, palpitations, orthopnea, edema, abdominal pain, nausea, melena, diarrhea, constipation, flank pain, dysuria, hematuria, urinary  Frequency, nocturia, numbness, tingling, seizures,  Focal weakness, Loss of consciousness,  Tremor, insomnia, depression, anxiety, and suicidal ideation.      Objective:  BP 138/74 mmHg  Pulse 81  Temp(Src) 98 F (36.7 C) (Oral)  Resp 12  Ht 5\' 3"  (1.6 m)  Wt 127 lb 8 oz (57.834 kg)  BMI 22.59 kg/m2  SpO2 99%  BP  Readings from Last 3 Encounters:  06/15/15 138/74  12/01/14 136/72  08/04/14 140/74    Wt Readings from Last 3 Encounters:  06/15/15 127 lb 8 oz (57.834 kg)  12/01/14 123 lb 8 oz (56.019 kg)  08/04/14 125 lb 8 oz (56.926 kg)    General appearance: alert, cooperative and appears stated age Ears: normal TM's and external ear canals both ears Throat: lips, mucosa, and tongue normal; teeth and gums normal Neck: no adenopathy, no carotid bruit, supple, symmetrical, trachea midline and thyroid not enlarged, symmetric, no tenderness/mass/nodules Back:  symmetric, no curvature. ROM normal. No CVA tenderness. Lungs: clear to auscultation bilaterally Heart: regular rate and rhythm, S1, S2 normal, no murmur, click, rub or gallop Abdomen: soft, non-tender; bowel sounds normal; no masses,  no organomegaly Pulses: 2+ and symmetric Skin: Skin color, texture, turgor normal. No rashes or lesions Lymph nodes: Cervical, supraclavicular, and axillary nodes normal.  No results found for: HGBA1C  Lab Results  Component Value Date   CREATININE 0.82 06/15/2015   CREATININE 0.89 12/01/2014   CREATININE 0.83 06/30/2014    Lab Results  Component Value Date   WBC 4.9 12/01/2014   HGB 12.1 12/01/2014   HCT 36.5 12/01/2014   PLT 366.0 12/01/2014   GLUCOSE 84 06/15/2015   CHOL 160 06/30/2014   TRIG 51.0 06/30/2014   HDL 62.00 06/30/2014   LDLCALC 88 06/30/2014   ALT 22 06/15/2015   AST 29 06/15/2015   NA 131* 06/15/2015   K 4.4 06/15/2015   CL 96 06/15/2015   CREATININE 0.82 06/15/2015   BUN 12 06/15/2015   CO2 27 06/15/2015   TSH 1.73 06/15/2015    No results found.  Assessment & Plan:   Problem List Items Addressed This Visit    Hypothyroidism    Thyroid function is WNL on current dose.  No current changes needed.   Lab Results  Component Value Date   TSH 1.73 06/15/2015         Relevant Orders   TSH (Completed)   Asthma    Mild intermittent.  No exacerbations or use of MDI in 6 months.       Osteoporosis, postmenopausal - Primary    Has not been taking alendronate for the past 4 months because she remains concerned about developing OCN Jaw due to the recent experience of a friend, despite discussion of the  actual incidence of OCN. Does not want to start an alternative,  Exercising daily,  Taking Vit D and getting calcium 1800 mg daily .  Will repeat DEXA next year.        Essential hypertension    Well controlled on current regimen. Renal function stable, no changes today.  Lab Results  Component Value Date   NA  131* 06/15/2015   K 4.4 06/15/2015   CL 96 06/15/2015   CO2 27 06/15/2015   Lab Results  Component Value Date   CREATININE 0.82 06/15/2015         Generalized anxiety disorder    Managed with alprazolam low dose in the evenings..  Does not tolerate dyatime dose for prn use even with panic.  Uses deep breathing and meditation,  intolerant of SSRI        Other Visit Diagnoses    Hyponatremia        Relevant Orders    Comprehensive metabolic panel (Completed)     A total of 25 minutes of face to face time was spent with patient more than half of  which was spent in counselling about the above mentioned conditions  and coordination of care   I have discontinued Ms. Goucher's alendronate. I am also having her maintain her aspirin, Fluticasone-Salmeterol, cholecalciferol, Calcium Carbonate-Vitamin D (CALTRATE 600+D PO), multivitamin, vitamin E, fish oil-omega-3 fatty acids, loratadine, albuterol, metoprolol succinate, amLODipine, traMADol, and levothyroxine.  No orders of the defined types were placed in this encounter.    Medications Discontinued During This Encounter  Medication Reason  . alendronate (FOSAMAX) 70 MG tablet     Follow-up: Return in about 6 months (around 12/16/2015) for wellness.   Crecencio Mc, MD

## 2015-06-18 NOTE — Assessment & Plan Note (Signed)
Thyroid function is WNL on current dose.  No current changes needed.   Lab Results  Component Value Date   TSH 1.73 06/15/2015

## 2015-06-18 NOTE — Assessment & Plan Note (Signed)
Mild intermittent.  No exacerbations or use of MDI in 6 months.

## 2015-06-18 NOTE — Assessment & Plan Note (Signed)
Well controlled on current regimen. Renal function stable, no changes today.  Lab Results  Component Value Date   NA 131* 06/15/2015   K 4.4 06/15/2015   CL 96 06/15/2015   CO2 27 06/15/2015   Lab Results  Component Value Date   CREATININE 0.82 06/15/2015

## 2015-06-20 ENCOUNTER — Encounter: Payer: Self-pay | Admitting: *Deleted

## 2015-07-04 ENCOUNTER — Other Ambulatory Visit: Payer: Self-pay | Admitting: Internal Medicine

## 2015-07-04 NOTE — Telephone Encounter (Signed)
Ok to refill tramadol 

## 2015-07-05 NOTE — Telephone Encounter (Signed)
refilled 

## 2015-07-18 ENCOUNTER — Other Ambulatory Visit: Payer: Self-pay

## 2015-07-18 MED ORDER — METOPROLOL SUCCINATE ER 25 MG PO TB24: 25.0000 mg | ORAL_TABLET | Freq: Every day | ORAL | Status: DC

## 2015-08-30 ENCOUNTER — Other Ambulatory Visit: Payer: Self-pay | Admitting: Internal Medicine

## 2015-10-05 ENCOUNTER — Telehealth: Payer: Self-pay | Admitting: Internal Medicine

## 2015-10-05 NOTE — Telephone Encounter (Signed)
Pt would like ALL RX to go to [id number] - [place], [place].Marland Kitchen Now

## 2015-10-05 NOTE — Telephone Encounter (Signed)
I have removed all the additional pharmacies from her record, thanks

## 2015-10-31 ENCOUNTER — Ambulatory Visit (INDEPENDENT_AMBULATORY_CARE_PROVIDER_SITE_OTHER): Payer: Medicare Other | Admitting: Surgical

## 2015-10-31 DIAGNOSIS — Z111 Encounter for screening for respiratory tuberculosis: Secondary | ICD-10-CM | POA: Diagnosis not present

## 2015-10-31 NOTE — Progress Notes (Signed)
Patien came in today for PPD placement. Placed in left forearm and patient tolerated well.

## 2015-11-02 ENCOUNTER — Other Ambulatory Visit (INDEPENDENT_AMBULATORY_CARE_PROVIDER_SITE_OTHER): Payer: Medicare Other

## 2015-11-02 DIAGNOSIS — Z111 Encounter for screening for respiratory tuberculosis: Secondary | ICD-10-CM

## 2015-11-02 LAB — TB SKIN TEST
INDURATION: 0 mm
TB SKIN TEST: NEGATIVE

## 2015-11-02 NOTE — Progress Notes (Signed)
  I have reviewed the above information and agree with above.   Deronda Christian, MD 

## 2015-11-02 NOTE — Progress Notes (Signed)
  I have reviewed the above information and agree with above.   Santana Gosdin, MD 

## 2015-11-02 NOTE — Progress Notes (Signed)
Patient came in for PPD reading, results documented. thanks

## 2015-11-28 ENCOUNTER — Other Ambulatory Visit: Payer: Self-pay | Admitting: Internal Medicine

## 2015-12-12 ENCOUNTER — Ambulatory Visit: Payer: Medicare Other

## 2015-12-26 ENCOUNTER — Encounter (INDEPENDENT_AMBULATORY_CARE_PROVIDER_SITE_OTHER): Payer: Self-pay

## 2015-12-26 ENCOUNTER — Ambulatory Visit (INDEPENDENT_AMBULATORY_CARE_PROVIDER_SITE_OTHER): Payer: Medicare Other

## 2015-12-26 VITALS — BP 140/70 | HR 60 | Temp 98.6°F | Resp 12 | Ht 62.5 in | Wt 129.8 lb

## 2015-12-26 DIAGNOSIS — Z23 Encounter for immunization: Secondary | ICD-10-CM | POA: Diagnosis not present

## 2015-12-26 DIAGNOSIS — Z0000 Encounter for general adult medical examination without abnormal findings: Secondary | ICD-10-CM | POA: Diagnosis not present
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
## 2015-12-26 NOTE — Patient Instructions (Addendum)
[name] , Thank you for taking time to come for your Medicare Wellness Visit. I appreciate your ongoing commitment to your health goals. Please review the following plan we discussed and let me know if I can assist you in the future.   Follow up with Dr. Derrel Nip as needed.  These are the goals we discussed: Goals    . Healthy Lifestyle          Stay hydrated and continue drinking plenty of fluids. Stay active and continue walking regimen at the mall. Start attending [employer] when possible. Healthy foods for diverticulosis.  Educational material provided.       This is a list of the screening recommended for you and due dates:  Health Maintenance  Topic Date Due  . Tetanus Vaccine  06/15/2023  . Flu Shot  Completed  . DEXA scan (bone density measurement)  Completed  . Shingles Vaccine  Completed  . Pneumonia vaccines  Completed   Colonoscopy A colonoscopy is an exam to look at the entire large intestine (colon). This exam can help find problems such as tumors, polyps, inflammation, and areas of bleeding. The exam takes about 1 hour.  LET [facility] CARE PROVIDER KNOW ABOUT:   Any allergies you have.  All medicines you are taking, including vitamins, herbs, eye drops, creams, and over-the-counter medicines.  Previous problems you or members of your family have had with the use of anesthetics.  Any blood disorders you have.  Previous surgeries you have had.  Medical conditions you have. RISKS AND COMPLICATIONS  Generally, this is a safe procedure. However, as with any procedure, complications can occur. Possible complications include:  Bleeding.  Tearing or rupture of the colon wall.  Reaction to medicines given during the exam.  Infection (rare). BEFORE THE PROCEDURE   Ask your health care provider about changing or stopping your regular medicines.  You may be prescribed an oral bowel prep. This involves drinking a large amount of medicated liquid, starting the  day before your procedure. The liquid will cause you to have multiple loose stools until your stool is almost clear or light green. This cleans out your colon in preparation for the procedure.  Do not eat or drink anything else once you have started the bowel prep, unless your health care provider tells you it is safe to do so.  Arrange for someone to drive you home after the procedure. PROCEDURE   You will be given medicine to help you relax (sedative).  You will lie on your side with your knees bent.  A long, flexible tube with a light and camera on the end (colonoscope) will be inserted through the rectum and into the colon. The camera sends video back to a computer screen as it moves through the colon. The colonoscope also releases carbon dioxide gas to inflate the colon. This helps your health care provider see the area better.  During the exam, your health care provider may take a small tissue sample (biopsy) to be examined under a microscope if any abnormalities are found.  The exam is finished when the entire colon has been viewed. AFTER THE PROCEDURE   Do not drive for 24 hours after the exam.  You may have a small amount of blood in your stool.  You may pass moderate amounts of gas and have mild abdominal cramping or bloating. This is caused by the gas used to inflate your colon during the exam.  Ask when your test results will  be ready and how you will get your results. Make sure you get your test results.   This information is not intended to replace advice given to you by your health care provider. Make sure you discuss any questions you have with your health care provider.   Document Released: 03/15/2000 Document Revised: 01/06/2013 Document Reviewed: 11/23/2012 Elsevier Interactive Patient Education [employer].

## 2015-12-26 NOTE — Progress Notes (Signed)
  I have reviewed the above information and agree with above.   Teresa Tullo, MD 

## 2016-01-01 ENCOUNTER — Other Ambulatory Visit: Payer: Self-pay | Admitting: Internal Medicine

## 2016-01-01 NOTE — Telephone Encounter (Signed)
I will Refill for 30 days only.  OFFICE VISIT NEEDED prior to any more refills Please call patient  And set her up appt

## 2016-01-01 NOTE — Telephone Encounter (Signed)
No Ov 06/15/15 please advise to refill on Alprazolam.

## 2016-01-16 NOTE — Telephone Encounter (Signed)
Called pt and lm on vm to call office and schedule a medication follow up with Dr. Derrel Nip.

## 2016-01-16 NOTE — Telephone Encounter (Signed)
Please schedule patient medication follow up

## 2016-01-22 ENCOUNTER — Encounter: Payer: Self-pay | Admitting: Internal Medicine

## 2016-01-22 ENCOUNTER — Ambulatory Visit (INDEPENDENT_AMBULATORY_CARE_PROVIDER_SITE_OTHER): Payer: Medicare Other | Admitting: Internal Medicine

## 2016-01-22 VITALS — BP 150/84 | HR 63 | Temp 98.0°F | Resp 12 | Ht 63.0 in | Wt 126.0 lb

## 2016-01-22 DIAGNOSIS — E034 Atrophy of thyroid (acquired): Secondary | ICD-10-CM | POA: Diagnosis not present

## 2016-01-22 DIAGNOSIS — Z1231 Encounter for screening mammogram for malignant neoplasm of breast: Secondary | ICD-10-CM | POA: Diagnosis not present

## 2016-01-22 DIAGNOSIS — F411 Generalized anxiety disorder: Secondary | ICD-10-CM

## 2016-01-22 DIAGNOSIS — I10 Essential (primary) hypertension: Secondary | ICD-10-CM

## 2016-01-22 DIAGNOSIS — E559 Vitamin D deficiency, unspecified: Secondary | ICD-10-CM | POA: Diagnosis not present

## 2016-01-22 DIAGNOSIS — Z86010 Personal history of colonic polyps: Secondary | ICD-10-CM

## 2016-01-22 DIAGNOSIS — Z1239 Encounter for other screening for malignant neoplasm of breast: Secondary | ICD-10-CM

## 2016-01-22 MED ORDER — LOSARTAN POTASSIUM 50 MG PO TABS: 50.0000 mg | ORAL_TABLET | Freq: Every day | ORAL | 0 refills | Status: DC

## 2016-01-22 NOTE — Progress Notes (Signed)
Pre-visit discussion using our clinic review tool. No additional management support is needed unless otherwise documented below in the visit note.  

## 2016-01-22 NOTE — Patient Instructions (Addendum)
Your Blood pressure  is elevated today.   Holly Perez is 130/80 or less).  I am  stopping the metoprolol  And starting losartan 50 mg.  Take it  once daily In the evening  Continue to take amlodipine 10 mg daily in the morning  MAMMOGRAM has been ordered.  You are overdue

## 2016-01-22 NOTE — Progress Notes (Signed)
Subjective:  Patient ID: Holly Perez, female    DOB: Jun 25, 1940  Age: 75 y.o. MRN: [id number]  CC: The primary encounter diagnosis was Hypothyroidism due to acquired atrophy of thyroid. Diagnoses of Essential hypertension, Vitamin D deficiency, Breast cancer screening, Generalized anxiety disorder, and History of colonic polyps were also pertinent to this visit.  HPI BARRIE STOLDT presents for follow up on hypertension, untreated osteoporosis and GAD due to patient preference   BP elevated today.  Has not taken her Toprol today. Has been  Very active today    . 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  The woman is moving to  [employer].   Very active ,  Walks every day , goes to [employer]/  Left knee pain sue to history of  meniscal tears remotely,  occasional pain managed with tramadol, which she uses once daily   Sleeping well, uses alprazolam 0.5 tablet at night   Some neck muscle spasms occasionally   Recommended colonoscopy by [facility] last year Has deferred until now  .    Outpatient Medications Prior to Visit  Medication Sig Dispense Refill  . albuterol (PROVENTIL HFA;VENTOLIN HFA) 108 (90 BASE) MCG/ACT inhaler Inhale into the lungs every 6 (six) hours as needed for wheezing or shortness of breath.    . ALPRAZolam (XANAX) 0.25 MG tablet TAKE 1 TABLET BY MOUTH TWICE DAILY AS NEEDED 60 tablet 0  . amLODipine (NORVASC) 10 MG tablet TAKE 1 TABLET BY MOUTH EVERY DAY 90 tablet 0  . aspirin 81 MG tablet Take 81 mg by mouth daily.    . Calcium Carbonate-Vitamin D (CALTRATE 600+D PO) Take 1,200 mg by mouth daily.     . cholecalciferol (VITAMIN D) 1000 UNITS tablet Take 2,000 Units by mouth daily.    . fish oil-omega-3 fatty acids 1000 MG capsule Take 1 g by mouth daily.     . Fluticasone-Salmeterol (ADVAIR) 250-50 MCG/DOSE AEPB Inhale 1 puff into the lungs every 12 (twelve) hours.    Marland Kitchen levothyroxine  (SYNTHROID, LEVOTHROID) 50 MCG tablet TAKE 1 TABLET BY MOUTH EVERY DAY BEFORE BREAKFAST 90 tablet 1  . loratadine (CLARITIN) 10 MG tablet Take 10 mg by mouth daily as needed.    . Multiple Vitamin (MULTIVITAMIN) tablet Take 1 tablet by mouth daily.    . traMADol (ULTRAM) 50 MG tablet TAKE ONE TABLET BY MOUTH TWICE A DAY AS NEEDED 180 tablet 3  . vitamin E 100 UNIT capsule Take 100 Units by mouth daily.    . metoprolol succinate (TOPROL-XL) 25 MG 24 hr tablet Take 1 tablet (25 mg total) by mouth daily. 90 tablet 3   No facility-administered medications prior to visit.     Review of Systems;  Patient denies headache, fevers, malaise, unintentional weight loss, skin rash, eye pain, sinus congestion and sinus pain, sore throat, dysphagia,  hemoptysis , cough, dyspnea, wheezing, chest pain, palpitations, orthopnea, edema, abdominal pain, nausea, melena, diarrhea, constipation, flank pain, dysuria, hematuria, urinary  Frequency, nocturia, numbness, tingling, seizures,  Focal weakness, Loss of consciousness,  Tremor, insomnia, depression, anxiety, and suicidal ideation.      Objective:  BP (!) 150/84   Pulse 63   Temp 98 F (36.7 C) (Oral)   Resp 12   Ht 5\' 3"  (1.6 m)   Wt 126 lb (57.2 kg)   SpO2 98%   BMI 22.32 kg/m   BP Readings from  Last 3 Encounters:  01/22/16 (!) 150/84  12/26/15 140/70  06/15/15 138/74    Wt Readings from Last 3 Encounters:  01/22/16 126 lb (57.2 kg)  12/26/15 129 lb 12.8 oz (58.9 kg)  06/15/15 127 lb 8 oz (57.8 kg)    General appearance: alert, cooperative and appears stated age Ears: normal TM's and external ear canals both ears Throat: lips, mucosa, and tongue normal; teeth and gums normal Neck: no adenopathy, no carotid bruit, supple, symmetrical, trachea midline and thyroid not enlarged, symmetric, no tenderness/mass/nodules Back: symmetric, no curvature. ROM normal. No CVA tenderness. Lungs: clear to auscultation bilaterally Heart: regular rate and  rhythm, S1, S2 normal, no murmur, click, rub or gallop Abdomen: soft, non-tender; bowel sounds normal; no masses,  no organomegaly Pulses: 2+ and symmetric Skin: Skin color, texture, turgor normal. No rashes or lesions Lymph nodes: Cervical, supraclavicular, and axillary nodes normal.  No results found for: HGBA1C  Lab Results  Component Value Date   CREATININE 0.82 06/15/2015   CREATININE 0.89 12/01/2014   CREATININE 0.83 06/30/2014    Lab Results  Component Value Date   WBC 4.9 12/01/2014   HGB 12.1 12/01/2014   HCT 36.5 12/01/2014   PLT 366.0 12/01/2014   GLUCOSE 84 06/15/2015   CHOL 160 06/30/2014   TRIG 51.0 06/30/2014   HDL 62.00 06/30/2014   LDLCALC 88 06/30/2014   ALT 22 06/15/2015   AST 29 06/15/2015   NA 131 (L) 06/15/2015   K 4.4 06/15/2015   CL 96 06/15/2015   CREATININE 0.82 06/15/2015   BUN 12 06/15/2015   CO2 27 06/15/2015   TSH 1.73 06/15/2015    No results found.  Assessment & Plan:   Problem List Items Addressed This Visit    Hypothyroidism - Primary    Thyroid function was WNL on current dose in March .  No current changes needed.  Repeat TSH is pending.   Lab Results  Component Value Date   TSH 1.73 06/15/2015         Relevant Orders   TSH   Generalized anxiety disorder    Managed with alprazolam low dose in the evenings..  Does not tolerate dyatime dose for prn use even with panic attacks.  Continue to use deep breathing and meditation.  Documented intolerance  of SSRI.  The risks and benefits of benzodiazepine use were reviewed with patient today including excessive sedation leading to respiratory depression,  impaired thinking/driving, and addiction.  Patient was advised to avoid concurrent use with alcohol, to use medication only as needed and not to share with others  .        History of colonic polyps    Recommended follow up colonoscopy with Dr Bary Castilla for surveillance given history of adenomatous polyp      Essential  hypertension    Medication regimen changed today.  DC metoprol,  Adding losartan 50 mg daily at bedtime.  Continue 10 mg  amlodipine in the am . RTC 2 weeks for BP check       Relevant Medications   losartan (COZAAR) 50 MG tablet   Other Relevant Orders   Comprehensive metabolic panel    Other Visit Diagnoses    Vitamin D deficiency       Relevant Orders   VITAMIN D 25 Hydroxy (Vit-D Deficiency, Fractures)   Breast cancer screening       Relevant Orders   MM SCREENING BREAST TOMO BILATERAL     A total of 25 minutes of  face to face time was spent with patient more than half of which was spent in counselling about the above mentioned conditions  and coordination of care  I have discontinued Ms. Redcay's metoprolol succinate. I am also having her start on losartan. Additionally, I am having her maintain her aspirin, Fluticasone-Salmeterol, cholecalciferol, Calcium Carbonate-Vitamin D (CALTRATE 600+D PO), multivitamin, vitamin E, fish oil-omega-3 fatty acids, loratadine, albuterol, traMADol, levothyroxine, amLODipine, and ALPRAZolam.  Meds ordered this encounter  Medications  . losartan (COZAAR) 50 MG tablet    Sig: Take 1 tablet (50 mg total) by mouth daily.    Dispense:  30 tablet    Refill:  0    Medications Discontinued During This Encounter  Medication Reason  . metoprolol succinate (TOPROL-XL) 25 MG 24 hr tablet     Follow-up: Return in about 4 weeks (around 02/19/2016), or BP medication change .   Crecencio Mc, MD

## 2016-01-23 LAB — TSH: TSH: 2.16 u[IU]/mL (ref 0.35–4.50)

## 2016-01-23 LAB — COMPREHENSIVE METABOLIC PANEL
ALT: 20 U/L (ref 0–35)
AST: 29 U/L (ref 0–37)
Albumin: 4.1 g/dL (ref 3.5–5.2)
Alkaline Phosphatase: 43 U/L (ref 39–117)
BILIRUBIN TOTAL: 0.3 mg/dL (ref 0.2–1.2)
BUN: 11 mg/dL (ref 6–23)
CALCIUM: 9.7 mg/dL (ref 8.4–10.5)
CHLORIDE: 99 meq/L (ref 96–112)
CO2: 28 meq/L (ref 19–32)
Creatinine, Ser: 0.77 mg/dL (ref 0.40–1.20)
GFR: 93.6 mL/min (ref >60.00)
GLUCOSE: 82 mg/dL (ref 70–99)
Potassium: 4.4 mEq/L (ref 3.5–5.1)
Sodium: 133 mEq/L — ABNORMAL LOW (ref 135–145)
Total Protein: 8.2 g/dL (ref 6.0–8.3)

## 2016-01-23 LAB — VITAMIN D 25 HYDROXY (VIT D DEFICIENCY, FRACTURES): VITD: 41.91 ng/mL (ref 30.00–100.00)

## 2016-01-23 NOTE — Assessment & Plan Note (Signed)
Managed with alprazolam low dose in the evenings..  Does not tolerate dyatime dose for prn use even with panic attacks.  Continue to use deep breathing and meditation.  Documented intolerance  of SSRI.  The risks and benefits of benzodiazepine use were reviewed with patient today including excessive sedation leading to respiratory depression,  impaired thinking/driving, and addiction.  Patient was advised to avoid concurrent use with alcohol, to use medication only as needed and not to share with others  .   

## 2016-01-23 NOTE — Assessment & Plan Note (Signed)
Thyroid function was WNL on current dose in March .  No current changes needed.  Repeat TSH is pending.   Lab Results  Component Value Date   TSH 1.73 06/15/2015

## 2016-01-23 NOTE — Assessment & Plan Note (Signed)
Recommended follow up colonoscopy with Dr Bary Castilla for surveillance given history of adenomatous polyp

## 2016-01-23 NOTE — Assessment & Plan Note (Signed)
Medication regimen changed today.  DC metoprol,  Adding losartan 50 mg daily at bedtime.  Continue 10 mg  amlodipine in the am . RTC 2 weeks for BP check

## 2016-01-25 ENCOUNTER — Encounter: Payer: Self-pay | Admitting: Internal Medicine

## 2016-01-26 ENCOUNTER — Encounter: Payer: Self-pay | Admitting: *Deleted

## 2016-01-30 ENCOUNTER — Other Ambulatory Visit: Payer: Self-pay | Admitting: Internal Medicine

## 2016-01-30 NOTE — Telephone Encounter (Signed)
Last filled 10/09/15 Last office visit 01/22/16 Next office 02/28/16

## 2016-01-31 ENCOUNTER — Ambulatory Visit: Payer: Medicare Other | Admitting: Internal Medicine

## 2016-01-31 NOTE — Telephone Encounter (Signed)
Rx faxed

## 2016-02-15 ENCOUNTER — Other Ambulatory Visit: Payer: Self-pay | Admitting: Internal Medicine

## 2016-02-19 ENCOUNTER — Other Ambulatory Visit: Payer: Self-pay | Admitting: Internal Medicine

## 2016-02-19 NOTE — Telephone Encounter (Signed)
Pt is requesting to have her ALPRAZolam (XANAX) 0.25 MG tablet refilled. Pt has appt on 02/28/16

## 2016-02-20 ENCOUNTER — Other Ambulatory Visit: Payer: Self-pay | Admitting: *Deleted

## 2016-02-20 MED ORDER — ALPRAZOLAM 0.25 MG PO TABS: 0.2500 mg | ORAL_TABLET | Freq: Two times a day (BID) | ORAL | 5 refills | Status: DC | PRN

## 2016-02-20 NOTE — Telephone Encounter (Signed)
Refilled as requested given to [facility] to give to patient.

## 2016-02-20 NOTE — Telephone Encounter (Signed)
Patient was last seen on 01/22/16, last refill on 01/01/16 #60 and has an upcoming appt on 02/28/16.  Please advise, thanks

## 2016-02-28 ENCOUNTER — Ambulatory Visit (INDEPENDENT_AMBULATORY_CARE_PROVIDER_SITE_OTHER): Payer: Medicare Other | Admitting: Internal Medicine

## 2016-02-28 ENCOUNTER — Encounter: Payer: Self-pay | Admitting: Internal Medicine

## 2016-02-28 VITALS — BP 148/80 | HR 91 | Temp 98.4°F | Resp 12 | Ht 63.0 in | Wt 129.0 lb

## 2016-02-28 DIAGNOSIS — I10 Essential (primary) hypertension: Secondary | ICD-10-CM

## 2016-02-28 MED ORDER — AMLODIPINE BESYLATE 10 MG PO TABS: 10.0000 mg | ORAL_TABLET | Freq: Every day | ORAL | 1 refills | Status: DC

## 2016-02-28 NOTE — Assessment & Plan Note (Addendum)
After medication change which involved suspending metoprolol, continuining amlodipine and adding losartan 50 mg , BP is improved but not at goal.  Repeat BMET today,  Patient to check BP at home as I suspect she may suffer from white coat hypertension

## 2016-02-28 NOTE — Patient Instructions (Addendum)
Your blood pressure is better ,  but not at goal based on today's readings.  Please continue to check it at home.  If your readings at home are considerably lower that what we got here (140-160/80) , bring your home machine in for "calibration" so we can make sure it is accurate

## 2016-02-28 NOTE — Progress Notes (Signed)
Pre-visit discussion using our clinic review tool. No additional management support is needed unless otherwise documented below in the visit note.  

## 2016-02-28 NOTE — Progress Notes (Signed)
Subjective:  Patient ID: Holly Perez, female    DOB: 05-Nov-1940  Age: 75 y.o. MRN: [id number]  CC: The encounter diagnosis was Essential hypertension.  HPI RENESMAE MCGILBERRY presents for follow up on hypertension .  Metoprolol was stopped and losartan 50 mg added at bedtime .  She is taking amlodipine in the morning . Readings at [facility] have been much lower , systolics [id number].    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     Outpatient Medications Prior to Visit  Medication Sig Dispense Refill  . albuterol (PROVENTIL HFA;VENTOLIN HFA) 108 (90 BASE) MCG/ACT inhaler Inhale into the lungs every 6 (six) hours as needed for wheezing or shortness of breath.    . ALPRAZolam (XANAX) 0.25 MG tablet Take 1 tablet (0.25 mg total) by mouth 2 (two) times daily as needed. 60 tablet 5  . aspirin 81 MG tablet Take 81 mg by mouth daily.    . Calcium Carbonate-Vitamin D (CALTRATE 600+D PO) Take 1,200 mg by mouth daily.     . cholecalciferol (VITAMIN D) 1000 UNITS tablet Take 2,000 Units by mouth daily.    . fish oil-omega-3 fatty acids 1000 MG capsule Take 1 g by mouth daily.     . Fluticasone-Salmeterol (ADVAIR) 250-50 MCG/DOSE AEPB Inhale 1 puff into the lungs every 12 (twelve) hours.    Marland Kitchen levothyroxine (SYNTHROID, LEVOTHROID) 50 MCG tablet TAKE 1 TABLET BY MOUTH EVERY DAY BEFORE BREAKFAST 90 tablet 1  . loratadine (CLARITIN) 10 MG tablet Take 10 mg by mouth daily as needed.    Marland Kitchen losartan (COZAAR) 50 MG tablet TAKE 1 TABLET(50 MG) BY MOUTH DAILY 30 tablet 0  . Multiple Vitamin (MULTIVITAMIN) tablet Take 1 tablet by mouth daily.    . traMADol (ULTRAM) 50 MG tablet TAKE 1 TABLET BY MOUTH TWICE DAILY 180 tablet 0  . vitamin E 100 UNIT capsule Take 100 Units by mouth daily.    Marland Kitchen amLODipine (NORVASC) 10 MG tablet TAKE 1 TABLET BY MOUTH EVERY DAY 90 tablet 1   No facility-administered medications prior to visit.     Review of Systems;  Patient denies  headache, fevers, malaise, unintentional weight loss, skin rash, eye pain, sinus congestion and sinus pain, sore throat, dysphagia,  hemoptysis , cough, dyspnea, wheezing, chest pain, palpitations, orthopnea, edema, abdominal pain, nausea, melena, diarrhea, constipation, flank pain, dysuria, hematuria, urinary  Frequency, nocturia, numbness, tingling, seizures,  Focal weakness, Loss of consciousness,  Tremor, insomnia, depression, and suicidal ideation.      Objective:  BP (!) 148/80   Pulse 91   Temp 98.4 F (36.9 C) (Oral)   Resp 12   Ht 5\' 3"  (1.6 m)   Wt 129 lb (58.5 kg)   SpO2 98%   BMI 22.85 kg/m    Repeat check by me  160/82  BP Readings from Last 3 Encounters:  02/28/16 (!) 148/80  01/22/16 (!) 150/84  12/26/15 140/70    Wt Readings from Last 3 Encounters:  02/28/16 129 lb (58.5 kg)  01/22/16 126 lb (57.2 kg)  12/26/15 129 lb 12.8 oz (58.9 kg)    General appearance: alert, cooperative and appears stated age Ears: normal TM's and external ear canals both ears Throat: lips, mucosa, and tongue normal; teeth and gums normal Neck: no adenopathy, no carotid bruit, supple, symmetrical, trachea midline and thyroid not enlarged, symmetric, no tenderness/mass/nodules Back: symmetric, no curvature. ROM normal. No CVA tenderness. Lungs:  clear to auscultation bilaterally Heart: regular rate and rhythm, S1, S2 normal, no murmur, click, rub or gallop Abdomen: soft, non-tender; bowel sounds normal; no masses,  no organomegaly Pulses: 2+ and symmetric Skin: Skin color, texture, turgor normal. No rashes or lesions Lymph nodes: Cervical, supraclavicular, and axillary nodes normal.  No results found for: HGBA1C  Lab Results  Component Value Date   CREATININE 0.85 02/28/2016   CREATININE 0.77 01/22/2016   CREATININE 0.82 06/15/2015    Lab Results  Component Value Date   WBC 4.9 12/01/2014   HGB 12.1 12/01/2014   HCT 36.5 12/01/2014   PLT 366.0 12/01/2014   GLUCOSE 88  02/28/2016   CHOL 160 06/30/2014   TRIG 51.0 06/30/2014   HDL 62.00 06/30/2014   LDLCALC 88 06/30/2014   ALT 20 01/22/2016   AST 29 01/22/2016   NA 133 (L) 02/28/2016   K 4.2 02/28/2016   CL 97 02/28/2016   CREATININE 0.85 02/28/2016   BUN 11 02/28/2016   CO2 30 02/28/2016   TSH 2.16 01/22/2016    No results found.  Assessment & Plan:   Problem List Items Addressed This Visit    Essential hypertension - Primary    After medication change which involved suspending metoprolol, continuining amlodipine and adding losartan 50 mg , BP is improved but not at goal.  Repeat BMET today,  Patient to check BP at home as I suspect she may suffer from white coat hypertension       Relevant Medications   amLODipine (NORVASC) 10 MG tablet   Other Relevant Orders   Basic metabolic panel (Completed)      I have changed Ms. Kuang's amLODipine. I am also having her maintain her aspirin, Fluticasone-Salmeterol, cholecalciferol, Calcium Carbonate-Vitamin D (CALTRATE 600+D PO), multivitamin, vitamin E, fish oil-omega-3 fatty acids, loratadine, albuterol, levothyroxine, traMADol, losartan, and ALPRAZolam.  Meds ordered this encounter  Medications  . amLODipine (NORVASC) 10 MG tablet    Sig: Take 1 tablet (10 mg total) by mouth daily.    Dispense:  90 tablet    Refill:  1    Medications Discontinued During This Encounter  Medication Reason  . amLODipine (NORVASC) 10 MG tablet Reorder    Follow-up: No Follow-up on file.   Crecencio Mc, MD

## 2016-02-29 ENCOUNTER — Ambulatory Visit
Admission: RE | Admit: 2016-02-29 | Discharge: 2016-02-29 | Disposition: A | Discharge status: Home / Self Care | Payer: Medicare Other | Source: Ambulatory Visit | Attending: Internal Medicine | Admitting: Internal Medicine

## 2016-02-29 DIAGNOSIS — Z1231 Encounter for screening mammogram for malignant neoplasm of breast: Secondary | ICD-10-CM | POA: Insufficient documentation

## 2016-02-29 DIAGNOSIS — Z1239 Encounter for other screening for malignant neoplasm of breast: Secondary | ICD-10-CM

## 2016-02-29 LAB — BASIC METABOLIC PANEL
BUN: 11 mg/dL (ref 6–23)
CHLORIDE: 97 meq/L (ref 96–112)
CO2: 30 mEq/L (ref 19–32)
CREATININE: 0.85 mg/dL (ref 0.40–1.20)
Calcium: 10 mg/dL (ref 8.4–10.5)
GFR: 83.48 mL/min (ref >60.00)
GLUCOSE: 88 mg/dL (ref 70–99)
Potassium: 4.2 mEq/L (ref 3.5–5.1)
Sodium: 133 mEq/L — ABNORMAL LOW (ref 135–145)

## 2016-03-02 ENCOUNTER — Encounter: Payer: Self-pay | Admitting: Internal Medicine

## 2016-03-04 ENCOUNTER — Encounter: Payer: Self-pay | Admitting: *Deleted

## 2016-03-04 NOTE — Progress Notes (Signed)
Letter mailed

## 2016-03-05 ENCOUNTER — Ambulatory Visit: Payer: Medicare Other | Admitting: Internal Medicine

## 2016-03-06 ENCOUNTER — Inpatient Hospital Stay
Admission: RE | Admit: 2016-03-06 | Discharge: 2016-03-06 | Disposition: A | Discharge status: Home / Self Care | Payer: Self-pay | Source: Ambulatory Visit | Attending: *Deleted | Admitting: *Deleted

## 2016-03-06 ENCOUNTER — Other Ambulatory Visit: Payer: Self-pay | Admitting: *Deleted

## 2016-03-06 DIAGNOSIS — Z9289 Personal history of other medical treatment: Secondary | ICD-10-CM

## 2016-03-19 ENCOUNTER — Ambulatory Visit (INDEPENDENT_AMBULATORY_CARE_PROVIDER_SITE_OTHER): Payer: Medicare Other

## 2016-03-19 VITALS — BP 144/84 | HR 91 | Resp 16

## 2016-03-19 DIAGNOSIS — I10 Essential (primary) hypertension: Secondary | ICD-10-CM | POA: Diagnosis not present

## 2016-03-19 NOTE — Progress Notes (Signed)
Patient comes in for blood pressure check after adding losartan.  She brings in the following home blood pressure readings. 02/28/16 124/61 pulse 82 11;00pm 02/29/16 151/77 pulse 82 9:00am  02/29/16 133/76 pulse 90 5:45 pm 03/19/16 142/81 pulse 90   We check it with blood pressure with her machine 160/93 left arm  Patient was very anxious.  Let patient sit for five minutes and re-checked blood pressure manually right  arm 144/80 and left arm 140/76 .  Please advise.

## 2016-03-20 NOTE — Progress Notes (Signed)
Her home machine appears to be accurate.  No changes to medication today, but if BP at home is still 140/80 after another week,   Please ask her to call us back so I can adjust her losartan dose.  Regards,   Deborra Medina, MD

## 2016-03-22 MED ORDER — LOSARTAN POTASSIUM 50 MG PO TABS: 50.0000 mg | ORAL_TABLET | Freq: Every day | ORAL | 0 refills | Status: DC

## 2016-03-22 NOTE — Addendum Note (Signed)
Addended by: Nanci Pina on: 03/22/2016 03:25 PM   Modules accepted: Orders

## 2016-03-22 NOTE — Progress Notes (Signed)
Patient is monitoring BP for one week and will call office if BP is consistently over 140/80 or more. Called in 2 week supply of losartan incase dose has to be adjusted.

## 2016-03-22 NOTE — Progress Notes (Signed)
Left message for patient to return call to office. 

## 2016-04-02 ENCOUNTER — Other Ambulatory Visit: Payer: Self-pay

## 2016-04-02 NOTE — Telephone Encounter (Addendum)
Patient calls with BP readings she is concerned with BP readings.  She states she is not always siting for 5 minutes before taking blood pressure. Patient taking Losartan 50 mg daily.    Blood pressure reading are as follows:   12/27 146/71 pm 12/28 143/71 am 137/72 pm 12/29 160/77 am 142/84 9:30 pm 04/01/16 151/79 am  04/02/16 130/ 77 am 134/77 100 pm Please advise

## 2016-04-03 MED ORDER — LOSARTAN POTASSIUM 50 MG PO TABS: 50.0000 mg | ORAL_TABLET | Freq: Every day | ORAL | 0 refills | Status: DC

## 2016-04-03 MED ORDER — HYDROCHLOROTHIAZIDE 25 MG PO TABS: 12.5000 mg | ORAL_TABLET | Freq: Every day | ORAL | 0 refills | Status: DC

## 2016-04-03 NOTE — Telephone Encounter (Signed)
Patient advised of below and verbalized an understanding.  She states she will need a refill on the losartan was only given 15 day supply.

## 2016-04-03 NOTE — Telephone Encounter (Signed)
Left message to call on home message

## 2016-04-03 NOTE — Telephone Encounter (Signed)
ADDING HCTZ 12.5 MG DAILY TO MORNING DOSE OF LOSARTAN.  IF THAT BRINGS HER TO 120/70 ,  WE WILL COMBINE MEDS INTO ONE PILL

## 2016-04-04 ENCOUNTER — Other Ambulatory Visit: Payer: Self-pay | Admitting: Internal Medicine

## 2016-05-02 ENCOUNTER — Other Ambulatory Visit: Payer: Self-pay | Admitting: *Deleted

## 2016-05-02 NOTE — Telephone Encounter (Signed)
Patient requested a call to discuss her blood pressure. Patient reported having one elevated blood pressure on 04/10/16 was 151/79 with a pulse of 94. She reported that her blood pressure remains to be normal.   Pt contact  [phone]

## 2016-05-03 NOTE — Telephone Encounter (Signed)
Patient was told to call if elevated blood pressure blood pressure reading above 140/80 from last nurse visit.  See readings as listed below .  BP 151/79 pulse 94 am 149/81  pulse 95 am 136/85 pulse 81  137/74 pulse 74 134/70 73  151/79 94 pm  136/70 73 139/75 79 129/66 82  120/62 84  138/72 71

## 2016-05-03 NOTE — Telephone Encounter (Signed)
lmtc

## 2016-05-04 ENCOUNTER — Telehealth: Payer: Self-pay | Admitting: Internal Medicine

## 2016-05-04 MED ORDER — LOSARTAN POTASSIUM-HCTZ 100-12.5 MG PO TABS: 1.0000 | ORAL_TABLET | Freq: Every day | ORAL | 3 refills | Status: DC

## 2016-05-04 NOTE — Telephone Encounter (Signed)
bp readings are all too high (goal is 120/70).  I a adjusting the dose on her 2 meds and combining them into one pill

## 2016-05-06 NOTE — Telephone Encounter (Signed)
Pt informed

## 2016-05-09 NOTE — Addendum Note (Signed)
Addended by: Crecencio Mc on: 05/09/2016 09:31 AM   Modules accepted: Orders

## 2016-05-23 ENCOUNTER — Other Ambulatory Visit: Payer: Self-pay | Admitting: Internal Medicine

## 2016-05-23 NOTE — Telephone Encounter (Signed)
Refilled 01/30/2016. Last office visit 01/22/2016. Pt does not have a follow up appt. Please advise.

## 2016-05-23 NOTE — Telephone Encounter (Signed)
Refilled for 30 day intervals until April ,because it is a controlled substance  6 month follow up needs to be scheduled in late April

## 2016-05-24 NOTE — Telephone Encounter (Signed)
Spoke with pt and about her refill and needing to schedule an appt in April so that she can continue getting her tramadol filled. The pt stated that she would call back to schedule the appt. While on the phone with the pt she stated that she would like to let you know that her BP readings are much better. She stated that they are anywhere from 133/70 and lower.

## 2016-05-24 NOTE — Telephone Encounter (Signed)
Printed and faxed

## 2016-05-30 ENCOUNTER — Other Ambulatory Visit: Payer: Self-pay | Admitting: Internal Medicine

## 2016-05-31 ENCOUNTER — Other Ambulatory Visit: Payer: Self-pay | Admitting: Internal Medicine

## 2016-06-01 ENCOUNTER — Other Ambulatory Visit: Payer: Self-pay | Admitting: Internal Medicine

## 2016-06-02 ENCOUNTER — Other Ambulatory Visit: Payer: Self-pay | Admitting: Internal Medicine

## 2016-06-06 ENCOUNTER — Other Ambulatory Visit: Payer: Self-pay | Admitting: Internal Medicine

## 2016-06-11 ENCOUNTER — Other Ambulatory Visit: Payer: Self-pay | Admitting: Internal Medicine

## 2016-07-11 ENCOUNTER — Encounter: Payer: Self-pay | Admitting: Internal Medicine

## 2016-07-11 ENCOUNTER — Telehealth: Payer: Self-pay | Admitting: Radiology

## 2016-07-11 ENCOUNTER — Ambulatory Visit (INDEPENDENT_AMBULATORY_CARE_PROVIDER_SITE_OTHER): Payer: Medicare Other | Admitting: Internal Medicine

## 2016-07-11 ENCOUNTER — Ambulatory Visit (INDEPENDENT_AMBULATORY_CARE_PROVIDER_SITE_OTHER): Payer: Medicare Other

## 2016-07-11 VITALS — BP 152/84 | HR 58 | Temp 98.0°F | Resp 16 | Ht 63.0 in | Wt 125.8 lb

## 2016-07-11 DIAGNOSIS — M25562 Pain in left knee: Secondary | ICD-10-CM

## 2016-07-11 DIAGNOSIS — I10 Essential (primary) hypertension: Secondary | ICD-10-CM

## 2016-07-11 DIAGNOSIS — F411 Generalized anxiety disorder: Secondary | ICD-10-CM | POA: Diagnosis not present

## 2016-07-11 DIAGNOSIS — E039 Hypothyroidism, unspecified: Secondary | ICD-10-CM

## 2016-07-11 DIAGNOSIS — G8929 Other chronic pain: Secondary | ICD-10-CM | POA: Diagnosis not present

## 2016-07-11 DIAGNOSIS — M7989 Other specified soft tissue disorders: Secondary | ICD-10-CM | POA: Diagnosis not present

## 2016-07-11 MED ORDER — LOSARTAN POTASSIUM-HCTZ 100-25 MG PO TABS: 1.0000 | ORAL_TABLET | Freq: Every day | ORAL | 3 refills | Status: DC

## 2016-07-11 MED ORDER — AMLODIPINE BESYLATE 10 MG PO TABS: 10.0000 mg | ORAL_TABLET | Freq: Every day | ORAL | 1 refills | Status: DC

## 2016-07-11 NOTE — Patient Instructions (Addendum)
I recommend getting the majority of your calcium and Vitamin D  through diet rather than supplements given the recent association of calcium supplements with increased coronary artery calcium scores  Try the almond and cashew milks that most grocery stores  now carry  in the dairy  Section>   They are lactose free:  Silk brand Almond Light,  Original formula.  Delicious,  Low carb,  Low cal,  Cholesterol free   Your blood pressure is fine based on your home readings   Your current blood pressure regimen is as follows  Amlodipine 10 mg daily Losartan 100/25 one tablet daily in the morning (sent to your pharmacy)   Return for fasting labs soon

## 2016-07-11 NOTE — Progress Notes (Signed)
Subjective:  Patient ID: Holly Perez, female    DOB: 08-15-1940  Age: 76 y.o. MRN: [id number]  CC: The primary encounter diagnosis was Essential hypertension. Diagnoses of Acquired hypothyroidism, Chronic pain of left knee, and Generalized anxiety disorder were also pertinent to this visit.  HPI Holly Perez presents for follow up on hypertension, hypothryuoid, anxiety.   Last seen in Nov 2017: Medication  Change:   toprol was suspended,  Taking amlodipine and losartan/hctz  With dose increase in feb   BP elevated due to spending day with patient in the ER all day yesterday.  didn't eat all day,  Felt nauseated and dizzy.  Home readings have been much better.    128/72  Pulse 78    134/76 pulse 85  132/67  Sleeping well using prn alprazolam    Persistent left knee pain , wants to avoid surgery,  Has had improvement with PT in a while  Last bmet nov 29  Mammogram normal dec 2017 Colonoscopy 2011 was due in 2014 for polyps follow up .  Has been postponing it using a natural  Stool softener    Outpatient Medications Prior to Visit  Medication Sig Dispense Refill  . albuterol (PROVENTIL HFA;VENTOLIN HFA) 108 (90 BASE) MCG/ACT inhaler Inhale into the lungs every 6 (six) hours as needed for wheezing or shortness of breath.    . ALPRAZolam (XANAX) 0.25 MG tablet Take 1 tablet (0.25 mg total) by mouth 2 (two) times daily as needed. 60 tablet 5  . aspirin 81 MG tablet Take 81 mg by mouth daily.    . Calcium Carbonate-Vitamin D (CALTRATE 600+D PO) Take 1,200 mg by mouth daily.     . cholecalciferol (VITAMIN D) 1000 UNITS tablet Take 2,000 Units by mouth daily.    . fish oil-omega-3 fatty acids 1000 MG capsule Take 1 g by mouth daily.     . Fluticasone-Salmeterol (ADVAIR) 250-50 MCG/DOSE AEPB Inhale 1 puff into the lungs every 12 (twelve) hours.    Marland Kitchen levothyroxine (SYNTHROID, LEVOTHROID) 50 MCG tablet TAKE 1 TABLET BY MOUTH EVERY DAY BEFORE BREAKFAST 90 tablet 0  . Multiple Vitamin  (MULTIVITAMIN) tablet Take 1 tablet by mouth daily.    . traMADol (ULTRAM) 50 MG tablet TAKE 1 TABLET BY MOUTH TWICE DAILY 60 tablet 1  . vitamin E 100 UNIT capsule Take 100 Units by mouth daily.    Marland Kitchen amLODipine (NORVASC) 10 MG tablet Take 1 tablet (10 mg total) by mouth daily. 90 tablet 1  . hydrochlorothiazide (HYDRODIURIL) 25 MG tablet TAKE 1/2 TABLET(12.5 MG) BY MOUTH DAILY 30 tablet 0  . loratadine (CLARITIN) 10 MG tablet Take 10 mg by mouth daily as needed.    Marland Kitchen losartan-hydrochlorothiazide (HYZAAR) 100-12.5 MG tablet Take 1 tablet by mouth daily. (Patient not taking: Reported on 07/11/2016) 30 tablet 3   No facility-administered medications prior to visit.     Review of Systems;  Patient denies headache, fevers, malaise, unintentional weight loss, skin rash, eye pain, sinus congestion and sinus pain, sore throat, dysphagia,  hemoptysis , cough, dyspnea, wheezing, chest pain, palpitations, orthopnea, edema, abdominal pain, nausea, melena, diarrhea, constipation, flank pain, dysuria, hematuria, urinary  Frequency, nocturia, numbness, tingling, seizures,  Focal weakness, Loss of consciousness,  Tremor, insomnia, depression, anxiety, and suicidal ideation.      Objective:  BP (!) 152/84   Pulse (!) 58   Temp 98 F (36.7 C) (Oral)   Resp 16   Ht 5\' 3"  (1.6 m)  Wt 125 lb 12.8 oz (57.1 kg)   SpO2 98%   BMI 22.28 kg/m   BP Readings from Last 3 Encounters:  07/11/16 (!) 152/84  03/19/16 (!) 144/84  02/28/16 (!) 148/80    Wt Readings from Last 3 Encounters:  07/11/16 125 lb 12.8 oz (57.1 kg)  02/28/16 129 lb (58.5 kg)  01/22/16 126 lb (57.2 kg)    General appearance: alert, cooperative and appears stated age Ears: normal TM's and external ear canals both ears Throat: lips, mucosa, and tongue normal; teeth and gums normal Neck: no adenopathy, no carotid bruit, supple, symmetrical, trachea midline and thyroid not enlarged, symmetric, no tenderness/mass/nodules Back: symmetric,  no curvature. ROM normal. No CVA tenderness. Lungs: clear to auscultation bilaterally Heart: regular rate and rhythm, S1, S2 normal, no murmur, click, rub or gallop Abdomen: soft, non-tender; bowel sounds normal; no masses,  no organomegaly Pulses: 2+ and symmetric Skin: Skin color, texture, turgor normal. No rashes or lesions Lymph nodes: Cervical, supraclavicular, and axillary nodes normal.  No results found for: HGBA1C  Lab Results  Component Value Date   CREATININE 0.85 02/28/2016   CREATININE 0.77 01/22/2016   CREATININE 0.82 06/15/2015    Lab Results  Component Value Date   WBC 4.9 12/01/2014   HGB 12.1 12/01/2014   HCT 36.5 12/01/2014   PLT 366.0 12/01/2014   GLUCOSE 88 02/28/2016   CHOL 160 06/30/2014   TRIG 51.0 06/30/2014   HDL 62.00 06/30/2014   LDLCALC 88 06/30/2014   ALT 20 01/22/2016   AST 29 01/22/2016   NA 133 (L) 02/28/2016   K 4.2 02/28/2016   CL 97 02/28/2016   CREATININE 0.85 02/28/2016   BUN 11 02/28/2016   CO2 30 02/28/2016   TSH 2.16 01/22/2016    Mm Outside Films Mammo  Result Date: 03/06/2016 This examination belongs to an outside facility and is stored here for comparison purposes only.  Contact the originating outside institution for any associated report or interpretation.   Assessment & Plan:   Problem List Items Addressed This Visit    Acquired hypothyroidism    Thyroid function was WNL in October. .  No current changes needed.  .   Lab Results  Component Value Date   TSH 2.16 01/22/2016         Relevant Orders   TSH   Essential hypertension - Primary    Elevated today.  Reviewed list of meds, patient is not taking OTC meds that could be causing,. It.  Have asked patient to recheck bp at home a minimum of 5 times over the next 4 weeks and call readings to office for adjustment of medications.        Relevant Medications   losartan-hydrochlorothiazide (HYZAAR) 100-25 MG tablet   amLODipine (NORVASC) 10 MG tablet   Other  Relevant Orders   Comprehensive metabolic panel   Lipid panel   Generalized anxiety disorder    Managed with alprazolam low dose in the evenings..  Does not tolerate dyatime dose for prn use even with panic attacks.  Continue to use deep breathing and meditation.  Documented intolerance  of SSRI.  The risks and benefits of benzodiazepine use were reviewed with patient today including excessive sedation leading to respiratory depression,  impaired thinking/driving, and addiction.  Patient was advised to avoid concurrent use with alcohol, to use medication only as needed and not to share with others  .         Other Visit Diagnoses  Chronic pain of left knee       Relevant Orders   DG Knee Complete 4 Views Left (Completed)      I have discontinued Ms. Walpole's loratadine, losartan-hydrochlorothiazide, hydrochlorothiazide, losartan, and losartan-hydrochlorothiazide. I am also having her start on losartan-hydrochlorothiazide. Additionally, I am having her maintain her aspirin, Fluticasone-Salmeterol, cholecalciferol, Calcium Carbonate-Vitamin D (CALTRATE 600+D PO), multivitamin, vitamin E, fish oil-omega-3 fatty acids, albuterol, ALPRAZolam, traMADol, levothyroxine, and amLODipine.  Meds ordered this encounter  Medications  . DISCONTD: losartan (COZAAR) 50 MG tablet    Refill:  0  . DISCONTD: losartan-hydrochlorothiazide (HYZAAR) 100-12.5 MG tablet  . losartan-hydrochlorothiazide (HYZAAR) 100-25 MG tablet    Sig: Take 1 tablet by mouth daily.    Dispense:  90 tablet    Refill:  3  . amLODipine (NORVASC) 10 MG tablet    Sig: Take 1 tablet (10 mg total) by mouth daily.    Dispense:  90 tablet    Refill:  1    Medications Discontinued During This Encounter  Medication Reason  . losartan-hydrochlorothiazide (HYZAAR) 100-12.5 MG tablet Patient has not taken in last 30 days  . losartan-hydrochlorothiazide (HYZAAR) 100-12.5 MG tablet   . hydrochlorothiazide (HYDRODIURIL) 25 MG tablet   .  losartan (COZAAR) 50 MG tablet   . loratadine (CLARITIN) 10 MG tablet   . amLODipine (NORVASC) 10 MG tablet Reorder    Follow-up: Return in about 6 months (around 01/10/2017).   Crecencio Mc, MD

## 2016-07-11 NOTE — Telephone Encounter (Signed)
Opened in error

## 2016-07-11 NOTE — Progress Notes (Signed)
Pre visit review using our clinic review tool, if applicable. No additional management support is needed unless otherwise documented below in the visit note. 

## 2016-07-12 ENCOUNTER — Encounter: Payer: Self-pay | Admitting: Internal Medicine

## 2016-07-12 DIAGNOSIS — Z79899 Other long term (current) drug therapy: Secondary | ICD-10-CM | POA: Diagnosis not present

## 2016-07-12 DIAGNOSIS — Z79891 Long term (current) use of opiate analgesic: Secondary | ICD-10-CM | POA: Diagnosis not present

## 2016-07-13 NOTE — Assessment & Plan Note (Signed)
Elevated today.  Reviewed list of meds, patient is not taking OTC meds that could be causing,. It.  Have asked patient to recheck bp at home a minimum of 5 times over the next 4 weeks and call readings to office for adjustment of medications.   

## 2016-07-13 NOTE — Assessment & Plan Note (Signed)
Managed with alprazolam low dose in the evenings..  Does not tolerate dyatime dose for prn use even with panic attacks.  Continue to use deep breathing and meditation.  Documented intolerance  of SSRI.  The risks and benefits of benzodiazepine use were reviewed with patient today including excessive sedation leading to respiratory depression,  impaired thinking/driving, and addiction.  Patient was advised to avoid concurrent use with alcohol, to use medication only as needed and not to share with others  .

## 2016-07-13 NOTE — Assessment & Plan Note (Signed)
Thyroid function was WNL in October. .  No current changes needed.  .   Lab Results  Component Value Date   TSH 2.16 01/22/2016

## 2016-07-22 DIAGNOSIS — H2511 Age-related nuclear cataract, right eye: Secondary | ICD-10-CM | POA: Diagnosis not present

## 2016-07-25 ENCOUNTER — Ambulatory Visit (INDEPENDENT_AMBULATORY_CARE_PROVIDER_SITE_OTHER): Payer: Medicare Other | Admitting: Family

## 2016-07-25 ENCOUNTER — Other Ambulatory Visit: Payer: Medicare Other

## 2016-07-25 ENCOUNTER — Encounter: Payer: Self-pay | Admitting: Family

## 2016-07-25 VITALS — BP 126/74 | HR 80 | Temp 98.0°F | Ht 63.0 in | Wt 121.2 lb

## 2016-07-25 DIAGNOSIS — R05 Cough: Secondary | ICD-10-CM

## 2016-07-25 DIAGNOSIS — E039 Hypothyroidism, unspecified: Secondary | ICD-10-CM | POA: Diagnosis not present

## 2016-07-25 DIAGNOSIS — R059 Cough, unspecified: Secondary | ICD-10-CM

## 2016-07-25 DIAGNOSIS — I10 Essential (primary) hypertension: Secondary | ICD-10-CM | POA: Diagnosis not present

## 2016-07-25 LAB — COMPREHENSIVE METABOLIC PANEL
ALT: 21 U/L (ref 0–35)
AST: 39 U/L — ABNORMAL HIGH (ref 0–37)
Albumin: 4.2 g/dL (ref 3.5–5.2)
Alkaline Phosphatase: 51 U/L (ref 39–117)
BUN: 10 mg/dL (ref 6–23)
CHLORIDE: 91 meq/L — AB (ref 96–112)
CO2: 31 meq/L (ref 19–32)
Calcium: 9.6 mg/dL (ref 8.4–10.5)
Creatinine, Ser: 0.77 mg/dL (ref 0.40–1.20)
GFR: 93.47 mL/min (ref >60.00)
GLUCOSE: 85 mg/dL (ref 70–99)
POTASSIUM: 3.7 meq/L (ref 3.5–5.1)
Sodium: 127 mEq/L — ABNORMAL LOW (ref 135–145)
Total Bilirubin: 0.5 mg/dL (ref 0.2–1.2)
Total Protein: 8.2 g/dL (ref 6.0–8.3)

## 2016-07-25 LAB — LIPID PANEL
CHOL/HDL RATIO: 2
Cholesterol: 141 mg/dL (ref 0–200)
HDL: 58.6 mg/dL (ref >39.00)
LDL CALC: 69 mg/dL (ref 0–99)
NONHDL: 82.36
Triglycerides: 68 mg/dL (ref 0.0–149.0)
VLDL: 13.6 mg/dL (ref 0.0–40.0)

## 2016-07-25 LAB — TSH: TSH: 2.48 u[IU]/mL (ref 0.35–4.50)

## 2016-07-25 NOTE — Patient Instructions (Addendum)
So happy to see that you are doing so much better  As discussed, if your symptoms do not continue to improve, please let me know- we will start antibiotic and order a chest xray.   Labs today for [employer]

## 2016-07-25 NOTE — Progress Notes (Signed)
Subjective:    Patient ID: Holly UTECHT, female    DOB: 12/27/1940, 76 y.o.   MRN: [id number]  CC: Holly Perez is a 76 y.o. female who presents today for an acute visit.    HPI: CC: cough, sinus congestion x 7 days, resolved. Here today as little cough still.  Had low grade fever ( tmax 101) 5 days ago, resolved since then , also describes  low appetite which has also improved.  Describes wheezing, sob 3 days ago, 'feeling better.' No inhaler. Tried OTC cough syrup with some relief.   Never has had PNA.   h/o asthma      HISTORY:  Past Medical History:  Diagnosis Date  . Asthma   . Colon polyp   . Hemorrhoids   . Hypertension   . Irritable bowel syndrome (IBS)    Past Surgical History:  Procedure Laterality Date  . ABDOMINAL HYSTERECTOMY    . COLONOSCOPY  2011   DR. Ifitkar ; tubular adenoma of the ascending colon without dysplasia or malignancy.  . ECTOPIC PREGNANCY SURGERY     at age 23  . OVARY SURGERY  1974   tumor   Family History  Problem Relation Age of Onset  . Early death Mother   . COPD Father   . Early death Brother   . Breast cancer Neg Hx     Allergies: Augmentin [amoxicillin-pot clavulanate] and Latex Current Outpatient Prescriptions on File Prior to Visit  Medication Sig Dispense Refill  . albuterol (PROVENTIL HFA;VENTOLIN HFA) 108 (90 BASE) MCG/ACT inhaler Inhale into the lungs every 6 (six) hours as needed for wheezing or shortness of breath.    . ALPRAZolam (XANAX) 0.25 MG tablet Take 1 tablet (0.25 mg total) by mouth 2 (two) times daily as needed. 60 tablet 5  . amLODipine (NORVASC) 10 MG tablet Take 1 tablet (10 mg total) by mouth daily. 90 tablet 1  . aspirin 81 MG tablet Take 81 mg by mouth daily.    . Calcium Carbonate-Vitamin D (CALTRATE 600+D PO) Take 1,200 mg by mouth daily.     . cholecalciferol (VITAMIN D) 1000 UNITS tablet Take 2,000 Units by mouth daily.    . fish oil-omega-3 fatty acids 1000 MG capsule Take 1 g by mouth daily.      . Fluticasone-Salmeterol (ADVAIR) 250-50 MCG/DOSE AEPB Inhale 1 puff into the lungs every 12 (twelve) hours.    Marland Kitchen levothyroxine (SYNTHROID, LEVOTHROID) 50 MCG tablet TAKE 1 TABLET BY MOUTH EVERY DAY BEFORE BREAKFAST 90 tablet 0  . losartan-hydrochlorothiazide (HYZAAR) 100-25 MG tablet Take 1 tablet by mouth daily. 90 tablet 3  . Multiple Vitamin (MULTIVITAMIN) tablet Take 1 tablet by mouth daily.    . traMADol (ULTRAM) 50 MG tablet TAKE 1 TABLET BY MOUTH TWICE DAILY 60 tablet 1  . vitamin E 100 UNIT capsule Take 100 Units by mouth daily.     No current facility-administered medications on file prior to visit.     Social History  Substance Use Topics  . Smoking status: Never Smoker  . Smokeless tobacco: Never Used  . Alcohol use No    Review of Systems  Constitutional: Negative for chills and fever.  HENT: Positive for congestion.   Respiratory: Positive for cough. Negative for shortness of breath and wheezing.   Cardiovascular: Negative for chest pain and palpitations.  Gastrointestinal: Negative for nausea and vomiting.      Objective:    BP 126/74   Pulse 80   Temp  29 F (36.7 C) (Oral)   Ht 5\' 3"  (1.6 m)   Wt 121 lb 3.2 oz (55 kg)   SpO2 97%   BMI 21.47 kg/m    Physical Exam  Constitutional: She appears well-developed and well-nourished.  HENT:  Head: Normocephalic and atraumatic.  Right Ear: Hearing, tympanic membrane, external ear and ear canal normal. No drainage, swelling or tenderness. No foreign bodies. Tympanic membrane is not erythematous and not bulging. No middle ear effusion. No decreased hearing is noted.  Left Ear: Hearing, tympanic membrane, external ear and ear canal normal. No drainage, swelling or tenderness. No foreign bodies. Tympanic membrane is not erythematous and not bulging.  No middle ear effusion. No decreased hearing is noted.  Nose: Nose normal. No rhinorrhea. Right sinus exhibits no maxillary sinus tenderness and no frontal sinus  tenderness. Left sinus exhibits no maxillary sinus tenderness and no frontal sinus tenderness.  Mouth/Throat: Uvula is midline, oropharynx is clear and moist and mucous membranes are normal. No oropharyngeal exudate, posterior oropharyngeal edema, posterior oropharyngeal erythema or tonsillar abscesses.  Eyes: Conjunctivae are normal.  Cardiovascular: Regular rhythm, normal heart sounds and normal pulses.   Pulmonary/Chest: Effort normal and breath sounds normal. She has no wheezes. She has no rhonchi. She has no rales.  Lymphadenopathy:       Head (right side): No submental, no submandibular, no tonsillar, no preauricular, no posterior auricular and no occipital adenopathy present.       Head (left side): No submental, no submandibular, no tonsillar, no preauricular, no posterior auricular and no occipital adenopathy present.    She has no cervical adenopathy.  Neurological: She is alert.  Skin: Skin is warm and dry.  Psychiatric: She has a normal mood and affect. Her speech is normal and behavior is normal. Thought content normal.  Vitals reviewed.      Assessment & Plan:  Cough  Afebrile today. Reassured as patient is well-appearing and in no acute distress. Her symptoms have largely resolved. We jointly agreed likely viral in nature. If symptoms do not continue to resolve, patient understands to let me know and we will start antibiotic in order chest x-ray. Return precautions given.      I am having Ms. Kuc maintain her aspirin, Fluticasone-Salmeterol, cholecalciferol, Calcium Carbonate-Vitamin D (CALTRATE 600+D PO), multivitamin, vitamin E, fish oil-omega-3 fatty acids, albuterol, ALPRAZolam, traMADol, levothyroxine, losartan-hydrochlorothiazide, amLODipine, and hydrochlorothiazide.   Meds ordered this encounter  Medications  . hydrochlorothiazide (HYDRODIURIL) 25 MG tablet    Return precautions given.   Risks, benefits, and alternatives of the medications and treatment plan  prescribed today were discussed, and patient expressed understanding.   Education regarding symptom management and diagnosis given to patient on AVS.  Continue to follow with TULLO, Aris Everts, MD for routine health maintenance.   Earnest Bailey and I agreed with plan.   Mable Paris, FNP

## 2016-07-25 NOTE — Progress Notes (Signed)
Pre visit review using our clinic review tool, if applicable. No additional management support is needed unless otherwise documented below in the visit note. 

## 2016-07-26 ENCOUNTER — Telehealth: Payer: Self-pay | Admitting: Internal Medicine

## 2016-07-26 NOTE — Telephone Encounter (Signed)
Pt called about Dr Nicki Reaper wanting pt to have her potassium redone. Need order please and thank you!  Call pt @ [phone] or cell [phone].

## 2016-07-26 NOTE — Telephone Encounter (Signed)
I have made lab appointment for patient. She would like you to know how much she appreciated the time that you spent going over the things last night.

## 2016-07-28 ENCOUNTER — Other Ambulatory Visit: Payer: Self-pay | Admitting: Internal Medicine

## 2016-07-28 DIAGNOSIS — E871 Hypo-osmolality and hyponatremia: Secondary | ICD-10-CM

## 2016-07-29 ENCOUNTER — Other Ambulatory Visit (INDEPENDENT_AMBULATORY_CARE_PROVIDER_SITE_OTHER): Payer: Medicare Other

## 2016-07-29 DIAGNOSIS — E871 Hypo-osmolality and hyponatremia: Secondary | ICD-10-CM | POA: Diagnosis not present

## 2016-07-29 LAB — BASIC METABOLIC PANEL
BUN: 9 mg/dL (ref 6–23)
CHLORIDE: 92 meq/L — AB (ref 96–112)
CO2: 28 meq/L (ref 19–32)
CREATININE: 0.69 mg/dL (ref 0.40–1.20)
Calcium: 9.8 mg/dL (ref 8.4–10.5)
GFR: 106.08 mL/min (ref >60.00)
Glucose, Bld: 89 mg/dL (ref 70–99)
Potassium: 3.7 mEq/L (ref 3.5–5.1)
Sodium: 127 mEq/L — ABNORMAL LOW (ref 135–145)

## 2016-07-30 ENCOUNTER — Other Ambulatory Visit: Payer: Self-pay | Admitting: Internal Medicine

## 2016-07-30 MED ORDER — LOSARTAN POTASSIUM 100 MG PO TABS: 100.0000 mg | ORAL_TABLET | Freq: Every day | ORAL | 3 refills | Status: DC

## 2016-07-31 ENCOUNTER — Telehealth: Payer: Self-pay | Admitting: Internal Medicine

## 2016-07-31 NOTE — Telephone Encounter (Signed)
Pt called back returning your call. Thank you! °

## 2016-08-01 ENCOUNTER — Telehealth: Payer: Self-pay

## 2016-08-01 DIAGNOSIS — E871 Hypo-osmolality and hyponatremia: Secondary | ICD-10-CM

## 2016-08-01 NOTE — Telephone Encounter (Signed)
LMTCB

## 2016-08-01 NOTE — Telephone Encounter (Signed)
Pt called back returning your call. Thank you!  Call pt @ [phone]

## 2016-08-01 NOTE — Telephone Encounter (Signed)
-----   Message from Crecencio Mc, MD sent at 07/30/2016  9:50 PM EDT ----- your sodium level is still low.  This is likely  due to the hydrochlorothiazide(HTCZ) you are taking for hypertension.  I would like you  to suspend this medication .  I will call in plain losartan to take instead.   return for a  repeat sodum level and a nurse visit for BP check in one week.   Dr. Derrel Nip

## 2016-08-01 NOTE — Telephone Encounter (Signed)
Spoke with pt and informed her of her lab results and explained to the pt that she needs to stop taking the HCTZ and that Dr. Derrel Nip has sent in a different medication called losartan for her to start taking. The pt gave a verbal understanding and was scheduled both a nurse visit and a lab appt in 1 week.

## 2016-08-08 ENCOUNTER — Ambulatory Visit (INDEPENDENT_AMBULATORY_CARE_PROVIDER_SITE_OTHER): Payer: Medicare Other | Admitting: *Deleted

## 2016-08-08 ENCOUNTER — Encounter: Payer: Self-pay | Admitting: *Deleted

## 2016-08-08 ENCOUNTER — Other Ambulatory Visit (INDEPENDENT_AMBULATORY_CARE_PROVIDER_SITE_OTHER): Payer: Medicare Other

## 2016-08-08 VITALS — BP 128/70 | HR 77 | Resp 12

## 2016-08-08 DIAGNOSIS — E871 Hypo-osmolality and hyponatremia: Secondary | ICD-10-CM

## 2016-08-08 DIAGNOSIS — I10 Essential (primary) hypertension: Secondary | ICD-10-CM | POA: Diagnosis not present

## 2016-08-08 LAB — BASIC METABOLIC PANEL
BUN: 10 mg/dL (ref 6–23)
CALCIUM: 9.5 mg/dL (ref 8.4–10.5)
CO2: 27 mEq/L (ref 19–32)
CREATININE: 0.77 mg/dL (ref 0.40–1.20)
Chloride: 96 mEq/L (ref 96–112)
GFR: 93.46 mL/min (ref >60.00)
Glucose, Bld: 85 mg/dL (ref 70–99)
Potassium: 4.8 mEq/L (ref 3.5–5.1)
SODIUM: 128 meq/L — AB (ref 135–145)

## 2016-08-08 NOTE — Progress Notes (Signed)
Patient presented for BP check after stopping HCTZ and starting Losartan post 1 week, left arm 128/72 pulse 77, Right arm 128/70 pulse 77.

## 2016-08-14 NOTE — Progress Notes (Signed)
  I have reviewed the above information and agree with above. BP is at goal  Without hctz   Deborra Medina, MD

## 2016-08-27 ENCOUNTER — Other Ambulatory Visit: Payer: Self-pay | Admitting: Internal Medicine

## 2016-08-27 ENCOUNTER — Telehealth: Payer: Self-pay | Admitting: *Deleted

## 2016-08-27 NOTE — Telephone Encounter (Signed)
Refilled: 05/23/2016 Last OV: 07/25/2016 Next OV: 12/24/2016

## 2016-08-27 NOTE — Telephone Encounter (Signed)
Patient is requested to have alprazolam filled along with amlodipine, tramadol and levothyroxine Pharmacy [facility] [address]  Pt contact [phone]

## 2016-08-27 NOTE — Addendum Note (Signed)
Addended by: Adair Laundry on: 08/27/2016 04:14 PM   Modules accepted: Orders

## 2016-08-29 ENCOUNTER — Other Ambulatory Visit: Payer: Self-pay

## 2016-08-29 MED ORDER — TRAMADOL HCL 50 MG PO TABS: 50.0000 mg | ORAL_TABLET | Freq: Two times a day (BID) | ORAL | 3 refills | Status: DC

## 2016-08-29 NOTE — Telephone Encounter (Signed)
REFILLED

## 2016-08-29 NOTE — Telephone Encounter (Signed)
Refilled: 05/23/2016 Last OV: 07/11/2016 Next OV: 12/24/2016

## 2016-08-29 NOTE — Telephone Encounter (Signed)
Printed, signed, and faxed

## 2016-09-20 ENCOUNTER — Telehealth: Payer: Self-pay | Admitting: Internal Medicine

## 2016-09-20 ENCOUNTER — Telehealth: Payer: Self-pay

## 2016-09-20 ENCOUNTER — Other Ambulatory Visit (INDEPENDENT_AMBULATORY_CARE_PROVIDER_SITE_OTHER): Payer: Medicare Other

## 2016-09-20 DIAGNOSIS — R300 Dysuria: Secondary | ICD-10-CM | POA: Diagnosis not present

## 2016-09-20 LAB — URINALYSIS, MICROSCOPIC ONLY
RBC / HPF: NONE SEEN (ref 0–?)
WBC, UA: NONE SEEN (ref 0–?)

## 2016-09-20 LAB — POCT URINALYSIS DIPSTICK
Bilirubin, UA: NEGATIVE
Glucose, UA: NEGATIVE
Ketones, UA: NEGATIVE
Leukocytes, UA: NEGATIVE
NITRITE UA: NEGATIVE
PROTEIN UA: NEGATIVE
SPEC GRAV UA: 1.01 (ref 1.010–1.025)
UROBILINOGEN UA: 0.2 U/dL
pH, UA: 6 (ref 5.0–8.0)

## 2016-09-20 NOTE — Telephone Encounter (Signed)
Spoke with pt and she stated that she is in a lot of pain. She stated that she is having urinary urgency, burning, and painful urination. Pt stated that this started about a week ago and doesn't think she can wait until Monday for an appt. Scheduled pt for a lab appt this afternoon to leave a urine sample. Does pt still need to keep appt on Monday to see you at 4:30pm?   UA, culture and micro have been ordered.

## 2016-09-20 NOTE — Telephone Encounter (Signed)
Pt called about having a UTI for about 1 week now. I scheduled pt for Monday at 4:30p. Nothing else avail to sch. Pt would like to know if she can get something for the pain or even start on some antibiotic? Pt does not want to wait until Monday. Please advise?   Call pt @ [phone] Thank you!

## 2016-09-20 NOTE — Telephone Encounter (Signed)
Labs ordered.

## 2016-09-20 NOTE — Telephone Encounter (Signed)
Not needed if she is dropping off a urine sample today .  We should tell FO that I NEVER make appointments 4-5 days out for UTI symptoms.  You can always have my patients drop off a urine specimen same day they call

## 2016-09-20 NOTE — Telephone Encounter (Signed)
appt has been canceled

## 2016-09-21 LAB — URINE CULTURE

## 2016-09-23 ENCOUNTER — Ambulatory Visit: Payer: Medicare Other | Admitting: Internal Medicine

## 2016-09-30 ENCOUNTER — Encounter: Payer: Self-pay | Admitting: Internal Medicine

## 2016-09-30 ENCOUNTER — Ambulatory Visit (INDEPENDENT_AMBULATORY_CARE_PROVIDER_SITE_OTHER): Payer: Medicare Other | Admitting: Internal Medicine

## 2016-09-30 VITALS — BP 140/74 | HR 79 | Temp 98.2°F | Resp 15 | Ht 63.0 in | Wt 122.2 lb

## 2016-09-30 DIAGNOSIS — F411 Generalized anxiety disorder: Secondary | ICD-10-CM

## 2016-09-30 DIAGNOSIS — I10 Essential (primary) hypertension: Secondary | ICD-10-CM | POA: Diagnosis not present

## 2016-09-30 DIAGNOSIS — E871 Hypo-osmolality and hyponatremia: Secondary | ICD-10-CM | POA: Diagnosis not present

## 2016-09-30 DIAGNOSIS — Z86010 Personal history of colonic polyps: Secondary | ICD-10-CM | POA: Diagnosis not present

## 2016-09-30 DIAGNOSIS — K5909 Other constipation: Secondary | ICD-10-CM | POA: Diagnosis not present

## 2016-09-30 DIAGNOSIS — R102 Pelvic and perineal pain: Secondary | ICD-10-CM | POA: Diagnosis not present

## 2016-09-30 LAB — BASIC METABOLIC PANEL
BUN: 10 mg/dL (ref 6–23)
CO2: 26 mEq/L (ref 19–32)
CREATININE: 0.74 mg/dL (ref 0.40–1.20)
Calcium: 9.3 mg/dL (ref 8.4–10.5)
Chloride: 94 mEq/L — ABNORMAL LOW (ref 96–112)
GFR: 97.81 mL/min (ref >60.00)
Glucose, Bld: 82 mg/dL (ref 70–99)
POTASSIUM: 4.2 meq/L (ref 3.5–5.1)
Sodium: 128 mEq/L — ABNORMAL LOW (ref 135–145)

## 2016-09-30 LAB — POC HEMOCCULT BLD/STL (OFFICE/1-CARD/DIAGNOSTIC)

## 2016-09-30 MED ORDER — ALPRAZOLAM 0.25 MG PO TABS: 0.2500 mg | ORAL_TABLET | Freq: Two times a day (BID) | ORAL | 5 refills | Status: DC | PRN

## 2016-09-30 NOTE — Patient Instructions (Addendum)
I recommend that you take a daily stool softener  Called Colace 100 mg (docusate is the active ingredient if you buy generic ) ,  You can take  up to 2 daily if needed  At bedtime.  This is not addicting to your bowels.  I take 200 mg every night    We should schedule your colonoscopy before the end of the year

## 2016-09-30 NOTE — Assessment & Plan Note (Signed)
She has deferred 3 yr follow up since 2014.  She has agreed to referral this year.

## 2016-09-30 NOTE — Progress Notes (Signed)
Subjective:  Patient ID: Holly Perez, female    DOB: [birth year]  Age: 76 y.o. MRN: [id number]  CC: The primary encounter diagnosis was Hyponatremia. Diagnoses of Suprapubic pain, Essential hypertension, Generalized anxiety disorder, History of colonic polyps, and Constipation, chronic were also pertinent to this visit.  HPI Holly Perez presents for evaluation of urinary problems. Patient reports having suprapubic pain and was evaluated for UTI  But UA is normal. Denies dysuria, hematuria,  Is not sexually active.  Surgical history: laparoscopic  hysterectomy  Ectopic pregnancy at age 63.  oophorectomy at age 67 for tumor, path was benign . No cervix per Dr Kathlen Mody  July 2013 not sure which ovary was removed Wants pelvic exam and rectal exam. . hAS INTERNAL HEMORRHOIDS . Last colonoscopy 201 3 yr follow up advised but not done    both done today/  FOBT negative  Pelvic negative   dexa May 2016 osteoporosis  Mammogram normal  nov 2017  Has chronic constipation and diverticulosis  by 2011 colonoscopy .  Uses a natural laxative wth senna once or twice week   CPE done today (breast pelbic and rectal )   Still using xanax for GAD aggravated by family issues  Using tramadol prn back pain ,  Not daily     Outpatient Medications Prior to Visit  Medication Sig Dispense Refill  . albuterol (PROVENTIL HFA;VENTOLIN HFA) 108 (90 BASE) MCG/ACT inhaler Inhale into the lungs every 6 (six) hours as needed for wheezing or shortness of breath.    Marland Kitchen amLODipine (NORVASC) 10 MG tablet Take 1 tablet (10 mg total) by mouth daily. 90 tablet 1  . aspirin 81 MG tablet Take 81 mg by mouth daily.    . Calcium Carbonate-Vitamin D (CALTRATE 600+D PO) Take 1,200 mg by mouth daily.     . cholecalciferol (VITAMIN D) 1000 UNITS tablet Take 2,000 Units by mouth daily.    . fish oil-omega-3 fatty acids 1000 MG capsule Take 1 g by mouth daily.     . Fluticasone-Salmeterol (ADVAIR) 250-50 MCG/DOSE AEPB Inhale 1 puff  into the lungs every 12 (twelve) hours.    Marland Kitchen levothyroxine (SYNTHROID, LEVOTHROID) 50 MCG tablet TAKE 1 TABLET BY MOUTH EVERY DAY BEFORE BREAKFAST 90 tablet 0  . losartan (COZAAR) 100 MG tablet Take 1 tablet (100 mg total) by mouth daily. 90 tablet 3  . Multiple Vitamin (MULTIVITAMIN) tablet Take 1 tablet by mouth daily.    . traMADol (ULTRAM) 50 MG tablet Take 1 tablet (50 mg total) by mouth 2 (two) times daily. 60 tablet 3  . vitamin E 100 UNIT capsule Take 100 Units by mouth daily.    Marland Kitchen ALPRAZolam (XANAX) 0.25 MG tablet TAKE 1 TABLET BY MOUTH TWICE DAILY AS NEEDED 60 tablet 0   No facility-administered medications prior to visit.     Review of Systems;  Patient denies headache, fevers, malaise, unintentional weight loss, skin rash, eye pain, sinus congestion and sinus pain, sore throat, dysphagia,  hemoptysis , cough, dyspnea, wheezing, chest pain, palpitations, orthopnea, edema, abdominal pain, nausea, melena, diarrhea, constipation, flank pain, dysuria, hematuria, urinary  Frequency, nocturia, numbness, tingling, seizures,  Focal weakness, Loss of consciousness,  Tremor, insomnia, depression, anxiety, and suicidal ideation.      Objective:  BP 140/74 (BP Location: Left Arm, Patient Position: Sitting, Cuff Size: Normal)   Pulse 79   Temp 98.2 F (36.8 C) (Oral)   Resp 15   Ht 5\' 3"  (1.6 m)   Wt 122  lb 3.2 oz (55.4 kg)   SpO2 100%   BMI 21.65 kg/m   BP Readings from Last 3 Encounters:  09/30/16 140/74  08/08/16 128/70  07/25/16 126/74    Wt Readings from Last 3 Encounters:  09/30/16 122 lb 3.2 oz (55.4 kg)  07/25/16 121 lb 3.2 oz (55 kg)  07/11/16 125 lb 12.8 oz (57.1 kg)    General Appearance:    Alert, cooperative, no distress, appears stated age  Head:    Normocephalic, without obvious abnormality, atraumatic  Eyes:    PERRL, conjunctiva/corneas clear, EOM's intact, fundi    benign, both eyes  Ears:    Normal TM's and external ear canals, both ears  Nose:   Nares  normal, septum midline, mucosa normal, no drainage    or sinus tenderness  Throat:   Lips, mucosa, and tongue normal; teeth and gums normal  Neck:   Supple, symmetrical, trachea midline, no adenopathy;    thyroid:  no enlargement/tenderness/nodules; no carotid   bruit or JVD  Back:     Symmetric, no curvature, ROM normal, no CVA tenderness  Lungs:     Clear to auscultation bilaterally, respirations unlabored  Chest Wall:    No tenderness or deformity   Heart:    Regular rate and rhythm, S1 and S2 normal, no murmur, rub   or gallop  Breast Exam:    No tenderness, masses, or nipple abnormality  Abdomen:     Soft, non-tender, bowel sounds active all four quadrants,    no masses, no organomegaly  Genitalia:    Pelvic: cervix surgically absent, external genitalia normal, no adnexal masses or tenderness,  rectovaginal septum normal, uterus surgically  absent and vagina normal without discharge Rectal: stool brown,  No masses.   Extremities:   Extremities normal, atraumatic, no cyanosis or edema  Pulses:   2+ and symmetric all extremities  Skin:   Skin color, texture, turgor normal, no rashes or lesions  Lymph nodes:   Cervical, supraclavicular, and axillary nodes normal  Neurologic:   CNII-XII intact, normal strength, sensation and reflexes    throughout   No results found for: HGBA1C  Lab Results  Component Value Date   CREATININE 0.74 09/30/2016   CREATININE 0.77 08/08/2016   CREATININE 0.69 07/29/2016    Lab Results  Component Value Date   WBC 4.9 12/01/2014   HGB 12.1 12/01/2014   HCT 36.5 12/01/2014   PLT 366.0 12/01/2014   GLUCOSE 82 09/30/2016   CHOL 141 07/25/2016   TRIG 68.0 07/25/2016   HDL 58.60 07/25/2016   LDLCALC 69 07/25/2016   ALT 21 07/25/2016   AST 39 (H) 07/25/2016   NA 128 (L) 09/30/2016   K 4.2 09/30/2016   CL 94 (L) 09/30/2016   CREATININE 0.74 09/30/2016   BUN 10 09/30/2016   CO2 26 09/30/2016   TSH 2.48 07/25/2016    Mm Outside Films  Mammo  Result Date: 03/06/2016 This examination belongs to an outside facility and is stored here for comparison purposes only.  Contact the originating outside institution for any associated report or interpretation.   Assessment & Plan:   Problem List Items Addressed This Visit    Constipation, chronic    Recommended daily use of stool softener       Generalized anxiety disorder    Managed with alprazolam low dose in the evenings..  Does not tolerate daytime dose for prn use even with panic attacks.  Continue to use deep breathing and meditation.  Documented intolerance  of SSRI.  The risks and benefits of benzodiazepine use were reviewed with patient today including excessive sedation leading to respiratory depression,  impaired thinking/driving, and addiction.  Patient was advised to avoid concurrent use with alcohol, to use medication only as needed and not to share with others  .        History of colonic polyps    She has deferred 3 yr follow up since 2014.  She has agreed to referral this year.       Essential hypertension   Suprapubic pain    Pelvic exam norma.  UA NORMAL .  LIKELY SECONDARY TO CONSTIPATOIN .  Stool softener,  Referral for colonoscopy as she is overdue.       Relevant Orders   POC Hemoccult Bld/Stl (1-Cd Office Dx) (Completed)    Other Visit Diagnoses    Hyponatremia    -  Primary   Relevant Orders   Basic metabolic panel (Completed)     A total of 40 minutes was spent with patient more than half of which was spent in counseling patient on the above mentioned issues , reviewing and explaining recent labs and imaging studies done, and coordination of care. I have changed Ms. Fitzsimons's ALPRAZolam. I am also having her maintain her aspirin, Fluticasone-Salmeterol, cholecalciferol, Calcium Carbonate-Vitamin D (CALTRATE 600+D PO), multivitamin, vitamin E, fish oil-omega-3 fatty acids, albuterol, amLODipine, losartan, levothyroxine, and traMADol.  Meds ordered  this encounter  Medications  . ALPRAZolam (XANAX) 0.25 MG tablet    Sig: Take 1 tablet (0.25 mg total) by mouth 2 (two) times daily as needed.    Dispense:  60 tablet    Refill:  5    Medications Discontinued During This Encounter  Medication Reason  . ALPRAZolam (XANAX) 0.25 MG tablet Reorder    Follow-up: Return in about 6 months (around 04/02/2017).   Crecencio Mc, MD

## 2016-09-30 NOTE — Assessment & Plan Note (Signed)
Recommended daily use of stool softener

## 2016-09-30 NOTE — Assessment & Plan Note (Addendum)
Pelvic exam norma.  UA NORMAL .  LIKELY SECONDARY TO CONSTIPATOIN .  Stool softener,  Referral for colonoscopy as she is overdue.

## 2016-09-30 NOTE — Assessment & Plan Note (Signed)
Managed with alprazolam low dose in the evenings..  Does not tolerate daytime dose for prn use even with panic attacks.  Continue to use deep breathing and meditation.  Documented intolerance  of SSRI.  The risks and benefits of benzodiazepine use were reviewed with patient today including excessive sedation leading to respiratory depression,  impaired thinking/driving, and addiction.  Patient was advised to avoid concurrent use with alcohol, to use medication only as needed and not to share with others  .

## 2016-11-05 ENCOUNTER — Ambulatory Visit (INDEPENDENT_AMBULATORY_CARE_PROVIDER_SITE_OTHER): Payer: Medicare Other | Admitting: *Deleted

## 2016-11-05 ENCOUNTER — Ambulatory Visit: Payer: Medicare Other

## 2016-11-05 DIAGNOSIS — Z111 Encounter for screening for respiratory tuberculosis: Secondary | ICD-10-CM

## 2016-11-05 NOTE — Progress Notes (Signed)
Patient presented for PPD testing

## 2016-11-07 ENCOUNTER — Ambulatory Visit (INDEPENDENT_AMBULATORY_CARE_PROVIDER_SITE_OTHER): Payer: Medicare Other

## 2016-11-07 DIAGNOSIS — Z111 Encounter for screening for respiratory tuberculosis: Secondary | ICD-10-CM

## 2016-11-07 LAB — TB SKIN TEST
INDURATION: 0 mm
TB SKIN TEST: NEGATIVE

## 2016-11-07 NOTE — Progress Notes (Addendum)
Patient comes in for PPD Reading. Read on Right forearm. PPD was negative. Letter given to patient for documentation.   Please advise as Dr. Derrel Nip is out of the office  Reviewed.  Dr Nicki Reaper

## 2016-11-20 ENCOUNTER — Other Ambulatory Visit: Payer: Self-pay | Admitting: Internal Medicine

## 2016-12-24 ENCOUNTER — Ambulatory Visit: Payer: Medicare Other

## 2016-12-26 ENCOUNTER — Ambulatory Visit (INDEPENDENT_AMBULATORY_CARE_PROVIDER_SITE_OTHER): Payer: Medicare Other

## 2016-12-26 VITALS — BP 126/68 | HR 75 | Temp 98.7°F | Resp 12 | Ht 63.0 in | Wt 127.4 lb

## 2016-12-26 DIAGNOSIS — Z1389 Encounter for screening for other disorder: Secondary | ICD-10-CM

## 2016-12-26 DIAGNOSIS — Z0000 Encounter for general adult medical examination without abnormal findings: Secondary | ICD-10-CM

## 2016-12-26 DIAGNOSIS — Z23 Encounter for immunization: Secondary | ICD-10-CM

## 2016-12-26 DIAGNOSIS — Z1331 Encounter for screening for depression: Secondary | ICD-10-CM

## 2016-12-26 NOTE — Patient Instructions (Addendum)
  Holly Perez , Thank you for taking time to come for your Medicare Wellness Visit. I appreciate your ongoing commitment to your health goals. Please review the following plan we discussed and let me know if I can assist you in the future.   Follow up with Dr. Derrel Nip as needed.    Have a great day!  These are the goals we discussed: Goals    . Maintain Healthy Lifestyle          Stay hydrated  Stay active  Healthy foods for diverticulosis       This is a list of the screening recommended for you and due dates:  Health Maintenance  Topic Date Due  . Tetanus Vaccine  06/15/2023  . Flu Shot  Completed  . DEXA scan (bone density measurement)  Completed  . Pneumonia vaccines  Completed

## 2016-12-26 NOTE — Progress Notes (Signed)
Subjective:   Holly Perez is a 76 y.o. female who presents for Medicare Annual (Subsequent) preventive examination.  Review of Systems:  No ROS.  Medicare Wellness Visit. Additional risk factors are reflected in the social history.  Cardiac Risk Factors include: advanced age (>19mn, >>35women);hypertension     Objective:     Vitals: BP 126/68 (BP Location: Left Arm, Patient Position: Sitting, Cuff Size: Normal)   Pulse 75   Temp 98.7 F (37.1 C) (Oral)   Resp 12   Ht _0  (1.6 m)   Wt 127 lb 6.4 oz (57.8 kg)   SpO2 98%   BMI 22.57 kg/m   Body mass index is 22.57 kg/m.   Tobacco History  Smoking Status  . Never Smoker  Smokeless Tobacco  . Never Used     Counseling given: Not Answered   Past Medical History:  Diagnosis Date  . Asthma   . Colon polyp   . Hemorrhoids   . Hypertension   . Irritable bowel syndrome (IBS)    Past Surgical History:  Procedure Laterality Date  . ABDOMINAL HYSTERECTOMY    . COLONOSCOPY  2011   DR. Ifitkar ; tubular adenoma of the ascending colon without dysplasia or malignancy.  . ECTOPIC PREGNANCY SURGERY     at age 76 . OVARY SURGERY  1974   tumor   Family History  Problem Relation Age of Onset  . Early death Mother   . COPD Father   . Early death Brother   . Breast cancer Neg Hx    History  Sexual Activity  . Sexual activity: No    Outpatient Encounter Prescriptions as of 12/26/2016  Medication Sig  . albuterol (PROVENTIL HFA;VENTOLIN HFA) 108 (90 BASE) MCG/ACT inhaler Inhale into the lungs every 6 (six) hours as needed for wheezing or shortness of breath.  . ALPRAZolam (XANAX) 0.25 MG tablet Take 1 tablet (0.25 mg total) by mouth 2 (two) times daily as needed.  .Marland KitchenamLODipine (NORVASC) 10 MG tablet Take 1 tablet (10 mg total) by mouth daily.  .Marland Kitchenaspirin 81 MG tablet Take 81 mg by mouth daily.  . Calcium Carbonate-Vitamin D (CALTRATE 600+D PO) Take 1,200 mg by mouth daily.   . cholecalciferol (VITAMIN D) 1000  UNITS tablet Take 2,000 Units by mouth daily.  . fish oil-omega-3 fatty acids 1000 MG capsule Take 1 g by mouth daily.   . Fluticasone-Salmeterol (ADVAIR) 250-50 MCG/DOSE AEPB Inhale 1 puff into the lungs every 12 (twelve) hours.  .Marland Kitchenlevothyroxine (SYNTHROID, LEVOTHROID) 50 MCG tablet TAKE 1 TABLET BY MOUTH EVERY DAY BEFORE BREAKFAST  . losartan (COZAAR) 100 MG tablet Take 1 tablet (100 mg total) by mouth daily.  . Multiple Vitamin (MULTIVITAMIN) tablet Take 1 tablet by mouth daily.  . traMADol (ULTRAM) 50 MG tablet Take 1 tablet (50 mg total) by mouth 2 (two) times daily.  . vitamin E 100 UNIT capsule Take 100 Units by mouth daily.   No facility-administered encounter medications on file as of 12/26/2016.     Activities of Daily Living In your present state of health, do you have any difficulty performing the following activities: 12/26/2016  Difficulty concentrating or making decisions? N  Walking or climbing stairs? N  Dressing or bathing? N  Doing errands, shopping? N  Preparing Food and eating ? N  Using the Toilet? N  In the past six months, have you accidently leaked urine? N  Do you have problems with loss of bowel control?  N  Managing your Medications? N  Managing your Finances? N  Housekeeping or managing your Housekeeping? N  Some recent data might be hidden    Patient Care Team: Crecencio Mc, MD as PCP - General (Internal Medicine) Crecencio Mc, MD (Internal Medicine) Bary Castilla Forest Gleason, MD (General Surgery)    Assessment:    This is a routine wellness examination for [place]. The goal of the wellness visit is to assist the patient how to close the gaps in care and create a preventative care plan for the patient.   The roster of all physicians providing medical care to patient is listed in the Snapshot section of the chart.  Taking calcium VIT D as appropriate/Osteoporosis reviewed.  No falls/fractures/breaks.  Safety issues reviewed; lives alone.  Smoke and  carbon monoxide detectors in the home. No firearms in the home.  Wears seatbelts when driving or riding with others. Patient does wear sunscreen or protective clothing when in direct sunlight. No violence in the home.  Depression- PHQ 2 &9 complete.  No signs/symptoms or verbal communication regarding little pleasure in doing things, feeling down, depressed or hopeless. No changes in sleeping, energy, eating, concentrating.  No thoughts of self harm or harm towards others.  Time spent on this topic is 8 minutes.   Patient is alert, normal appearance, oriented to person/place/and time.  Correctly identified the president of the [place], recall of 2/3 words, and performing simple calculations. Displays appropriate judgement and can read correct time from watch face.   No new identified risk were noted.  No failures at ADL's or IADL's.    BMI- discussed the importance of a healthy diet, water intake and the benefits of aerobic exercise. Educational material provided.   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  24 hour diet recall: Breakfast: english muffin with cream cheese Lunch: yogurt, half of [place] sandwich Dinner: lima beans, mac/cheese, chopped bbq Snack: ice cream Daily fluid intake: 1 cups of caffeine, 3-4 cups of water, 1 cup of gatorade.  Dental- Annual visits; Dr. Hyman Hopes.  Eye- Visual acuity not assessed per patient preference since they have regular follow up with the ophthalmologist.  Wears corrective lenses.  Sleep patterns- Sleeps 7 hours at night.  Wakes feeling rested.  High dose influenza vaccine administered L deltoid, tolerated well. Educational material provided.  Health maintenance gaps- closed.  Patient Concerns: None at this time. Follow up with PCP as needed.  Exercise Activities and Dietary recommendations Current Exercise Habits: Home exercise routine, Type of exercise: walking;strength training/weights (Attending Curves), Time (Minutes): 45,  Frequency (Times/Week): 3, Weekly Exercise (Minutes/Week): 135, Intensity: Moderate  Goals    . Maintain Healthy Lifestyle          Stay hydrated  Stay active  Healthy foods for diverticulosis      Fall Risk Fall Risk  12/26/2016 07/25/2016 12/26/2015 12/01/2014 08/26/2013  Falls in the past year? No No No No Yes  Number falls in past yr: - - - - 1  Injury with Fall? - - - - No   Depression Screen PHQ 2/9 Scores 12/26/2016 07/25/2016 12/26/2015 12/01/2014  PHQ - 2 Score 0 0 1 1     Cognitive Function MMSE - Mini Mental State Exam 12/26/2016 12/26/2015  Orientation to time 5 5  Orientation to Place 5 5  Registration 3 3  Attention/ Calculation 5 5  Recall 2 3  Language- name 2 objects 2 2  Language- repeat 1 1  Language-  follow 3 step command 3 3  Language- read & follow direction 1 1  Write a sentence 1 1  Copy design 1 1  Total score 29 30        Immunization History  Administered Date(s) Administered  . Influenza, High Dose Seasonal PF 12/26/2016  . Influenza,inj,Quad PF,6+ Mos 12/18/2012, 01/06/2014, 12/01/2014, 12/26/2015  . PPD Test 12/16/2012, 11/08/2014, 10/31/2015, 11/05/2016  . Pneumococcal Conjugate-13 06/15/2015  . Pneumococcal Polysaccharide-23 07/15/2004  . Td 06/14/2013  . Tdap 06/14/2013  . Zoster 06/08/2010   Screening Tests Health Maintenance  Topic Date Due  . TETANUS/TDAP  06/15/2023  . INFLUENZA VACCINE  Completed  . DEXA SCAN  Completed  . PNA vac Low Risk Adult  Completed      Plan:   End of life planning; Advanced aging; Advanced directives discussed.  No HCPOA/Living Will.  Additional information declined at this time.  I have personally reviewed and noted the following in the patient's chart:   . Medical and social history . Use of alcohol, tobacco or illicit drugs  . Current medications and supplements . Functional ability and status . Nutritional status . Physical activity . Advanced directives . List of other  physicians . Hospitalizations, surgeries, and ER visits in previous 12 months . Vitals . Screenings to include cognitive, depression, and falls . Referrals and appointments  In addition, I have reviewed and discussed with patient certain preventive protocols, quality metrics, and best practice recommendations. A written personalized care plan for preventive services as well as general preventive health recommendations were provided to patient.     Varney Biles, LPN  2/58/5277  Agree with plan. Mable Paris, NP

## 2017-01-22 ENCOUNTER — Other Ambulatory Visit: Payer: Self-pay | Admitting: Internal Medicine

## 2017-01-27 ENCOUNTER — Other Ambulatory Visit: Payer: Self-pay | Admitting: Internal Medicine

## 2017-01-27 DIAGNOSIS — Z1231 Encounter for screening mammogram for malignant neoplasm of breast: Secondary | ICD-10-CM

## 2017-02-18 ENCOUNTER — Other Ambulatory Visit: Payer: Self-pay | Admitting: Internal Medicine

## 2017-02-21 ENCOUNTER — Other Ambulatory Visit: Payer: Self-pay | Admitting: Internal Medicine

## 2017-03-04 ENCOUNTER — Ambulatory Visit
Admission: RE | Admit: 2017-03-04 | Discharge: 2017-03-04 | Disposition: A | Discharge status: Home / Self Care | Payer: Medicare Other | Source: Ambulatory Visit | Attending: Internal Medicine | Admitting: Internal Medicine

## 2017-03-04 DIAGNOSIS — Z1231 Encounter for screening mammogram for malignant neoplasm of breast: Secondary | ICD-10-CM | POA: Insufficient documentation

## 2017-03-06 ENCOUNTER — Other Ambulatory Visit: Payer: Self-pay | Admitting: Internal Medicine

## 2017-03-07 NOTE — Telephone Encounter (Signed)
Refilled: 01/22/2017 Last OV: 09/30/2016 Next OV: not scheduled

## 2017-03-07 NOTE — Telephone Encounter (Signed)
Need to clarify how often she is taking the medication.  Per last office note, not taking daily.  Can refill, but just need to clarify her needs.

## 2017-03-13 NOTE — Telephone Encounter (Signed)
Pt states that she sometimes take 1/2 or a whole one in the morning and another one around 3.

## 2017-03-17 NOTE — Telephone Encounter (Signed)
Please advise 

## 2017-03-17 NOTE — Telephone Encounter (Signed)
Refilling for 2 months,  Needs 6 month f/ u in Jan /Feb

## 2017-03-17 NOTE — Telephone Encounter (Signed)
rx has been printed, signed and faxed. Scheduled pt an appt with Dr. Derrel Nip. Pt is aware of appt date and time.

## 2017-04-08 ENCOUNTER — Other Ambulatory Visit: Payer: Self-pay

## 2017-04-08 NOTE — Telephone Encounter (Signed)
Refilled: 09/30/2016 Last OV: 09/30/2016 Next OV: 05/12/2017

## 2017-04-11 MED ORDER — ALPRAZOLAM 0.25 MG PO TABS: 0.2500 mg | ORAL_TABLET | Freq: Two times a day (BID) | ORAL | 5 refills | Status: DC | PRN

## 2017-04-11 NOTE — Telephone Encounter (Signed)
Printed, signed and faxed.  

## 2017-05-12 ENCOUNTER — Encounter: Payer: Self-pay | Admitting: Internal Medicine

## 2017-05-12 ENCOUNTER — Ambulatory Visit (INDEPENDENT_AMBULATORY_CARE_PROVIDER_SITE_OTHER): Payer: Medicare Other | Admitting: Internal Medicine

## 2017-05-12 VITALS — BP 154/80 | HR 70 | Temp 98.0°F | Resp 15 | Ht 63.0 in | Wt 125.8 lb

## 2017-05-12 DIAGNOSIS — E039 Hypothyroidism, unspecified: Secondary | ICD-10-CM | POA: Diagnosis not present

## 2017-05-12 DIAGNOSIS — F411 Generalized anxiety disorder: Secondary | ICD-10-CM

## 2017-05-12 DIAGNOSIS — F5101 Primary insomnia: Secondary | ICD-10-CM | POA: Diagnosis not present

## 2017-05-12 DIAGNOSIS — I10 Essential (primary) hypertension: Secondary | ICD-10-CM | POA: Diagnosis not present

## 2017-05-12 LAB — MICROALBUMIN / CREATININE URINE RATIO
Creatinine,U: 115.1 mg/dL
MICROALB/CREAT RATIO: 8.6 mg/g (ref 0.0–30.0)
Microalb, Ur: 9.9 mg/dL — ABNORMAL HIGH (ref 0.0–1.9)

## 2017-05-12 MED ORDER — ALPRAZOLAM 0.25 MG PO TABS: 0.2500 mg | ORAL_TABLET | Freq: Two times a day (BID) | ORAL | 5 refills | Status: DC | PRN

## 2017-05-12 NOTE — Progress Notes (Signed)
Subjective:  Patient ID: Holly Perez, female    DOB: [birth year]  Age: 77 y.o. MRN: [id number]  CC: The primary encounter diagnosis was Essential hypertension. Diagnoses of Acquired hypothyroidism, Primary insomnia, and Generalized anxiety disorder were also pertinent to this visit.  HPI Holly Perez presents for follow up on GAD managed with alpraxolam due to SSRI intolerance, hypertension,  Hypothyroidism  Mammogram normal Dec 2018    Hypertension: patient checks blood pressure twice weekly at home.  Readings have been for the most part < 140/80 at rest . Patient is following a reduces salt diet most days and is taking medications as prescribed.  Worried about the losartan recall , but has not contacted pharmacist to see if her bottle was one of the lots targeted for recall.   Anxiety:  Sleeps ok as long as she takes the alprazolam .  Notes that she experienced increased anxiety over the holidays due to reminiscence of deceased mother .    Used more alprazolam during the day,  (increased dose from 1/2 to a whole tablet) daily  But has since the holiday have come and gone she has  Reduced her daily  dose to 1/2 tablet      Reviewed her prior  Intolerance to other non benzodiazepine medications including Paxil,  buspirone,  and lexapro and the inherent long and short term risks of daily benzo use. and    She remains physically active,  Works as a Software engineer  4 hours daily,  Walks daily    No physical issues or strains with work   Fasting today except for coffee with cream   Outpatient Medications Prior to Visit  Medication Sig Dispense Refill  . albuterol (PROVENTIL HFA;VENTOLIN HFA) 108 (90 BASE) MCG/ACT inhaler Inhale into the lungs every 6 (six) hours as needed for wheezing or shortness of breath.    Marland Kitchen amLODipine (NORVASC) 10 MG tablet TAKE 1 TABLET(10 MG) BY MOUTH DAILY 90 tablet 0  . aspirin 81 MG tablet Take 81 mg by mouth daily.    . Calcium Carbonate-Vitamin D  (CALTRATE 600+D PO) Take 1,200 mg by mouth daily.     . cholecalciferol (VITAMIN D) 1000 UNITS tablet Take 2,000 Units by mouth daily.    . fish oil-omega-3 fatty acids 1000 MG capsule Take 1 g by mouth daily.     . Fluticasone-Salmeterol (ADVAIR) 250-50 MCG/DOSE AEPB Inhale 1 puff into the lungs every 12 (twelve) hours.    Marland Kitchen levothyroxine (SYNTHROID, LEVOTHROID) 50 MCG tablet TAKE 1 TABLET BY MOUTH EVERY DAY BEFORE BREAKFAST 90 tablet 0  . losartan (COZAAR) 100 MG tablet Take 1 tablet (100 mg total) by mouth daily. 90 tablet 3  . Multiple Vitamin (MULTIVITAMIN) tablet Take 1 tablet by mouth daily.    . traMADol (ULTRAM) 50 MG tablet TAKE 1 TABLET BY MOUTH TWICE DAILY 60 tablet 1  . vitamin E 100 UNIT capsule Take 100 Units by mouth daily.    Marland Kitchen ALPRAZolam (XANAX) 0.25 MG tablet Take 1 tablet (0.25 mg total) by mouth 2 (two) times daily as needed. 60 tablet 5   No facility-administered medications prior to visit.     Review of Systems;  Patient denies headache, fevers, malaise, unintentional weight loss, skin rash, eye pain, sinus congestion and sinus pain, sore throat, dysphagia,  hemoptysis , cough, dyspnea, wheezing, chest pain, palpitations, orthopnea, edema, abdominal pain, nausea, melena, diarrhea, constipation, flank pain, dysuria, hematuria, urinary  Frequency, nocturia, numbness, tingling, seizures,  Focal  weakness, Loss of consciousness,  Tremor, untreated insomnia, depression, , and suicidal ideation.      Objective:  BP (!) 154/80 (BP Location: Left Arm, Patient Position: Sitting, Cuff Size: Normal)   Pulse 70   Temp 98 F (36.7 C) (Oral)   Resp 15   Ht '5\' 3"'  (1.6 m)   Wt 125 lb 12.8 oz (57.1 kg)   SpO2 99%   BMI 22.28 kg/m   BP Readings from Last 3 Encounters:  05/12/17 (!) 154/80  12/26/16 126/68  09/30/16 140/74    Wt Readings from Last 3 Encounters:  05/12/17 125 lb 12.8 oz (57.1 kg)  12/26/16 127 lb 6.4 oz (57.8 kg)  09/30/16 122 lb 3.2 oz (55.4 kg)     General appearance: alert, cooperative and appears stated age Ears: normal TM's and external ear canals both ears Throat: lips, mucosa, and tongue normal; teeth and gums normal Neck: no adenopathy, no carotid bruit, supple, symmetrical, trachea midline and thyroid not enlarged, symmetric, no tenderness/mass/nodules Back: symmetric, no curvature. ROM normal. No CVA tenderness. Lungs: clear to auscultation bilaterally Heart: regular rate and rhythm, S1, S2 normal, no murmur, click, rub or gallop Abdomen: soft, non-tender; bowel sounds normal; no masses,  no organomegaly Pulses: 2+ and symmetric Skin: Skin color, texture, turgor normal. No rashes or lesions Lymph nodes: Cervical, supraclavicular, and axillary nodes normal.  No results found for: HGBA1C  Lab Results  Component Value Date   CREATININE 0.74 09/30/2016   CREATININE 0.77 08/08/2016   CREATININE 0.69 07/29/2016    Lab Results  Component Value Date   WBC 4.9 12/01/2014   HGB 12.1 12/01/2014   HCT 36.5 12/01/2014   PLT 366.0 12/01/2014   GLUCOSE 82 09/30/2016   CHOL 141 07/25/2016   TRIG 68.0 07/25/2016   HDL 58.60 07/25/2016   LDLCALC 69 07/25/2016   ALT 21 07/25/2016   AST 39 (H) 07/25/2016   NA 128 (L) 09/30/2016   K 4.2 09/30/2016   CL 94 (L) 09/30/2016   CREATININE 0.74 09/30/2016   BUN 10 09/30/2016   CO2 26 09/30/2016   TSH 2.48 07/25/2016   MICROALBUR 9.9 (H) 05/12/2017    Mm Screening Breast Tomo Bilateral  Result Date: 03/04/2017 CLINICAL DATA:  Screening. EXAM: 2D DIGITAL SCREENING BILATERAL MAMMOGRAM WITH CAD AND ADJUNCT TOMO COMPARISON:  Previous exam(s). ACR Breast Density Category b: There are scattered areas of fibroglandular density. FINDINGS: There are no findings suspicious for malignancy. Images were processed with CAD. IMPRESSION: No mammographic evidence of malignancy. A result letter of this screening mammogram will be mailed directly to the patient. RECOMMENDATION: Screening mammogram  in one year. (Code:SM-B-01Y) BI-RADS CATEGORY  1: Negative. Electronically Signed   By: Franki Cabot M.D.   On: 03/04/2017 10:55    Assessment & Plan:   Problem List Items Addressed This Visit    Acquired hypothyroidism    Thyroid function was WNL in April and is due for repeat assessment  Given elevated anxiety level and BP .  Marland Kitchen  Lab Results  Component Value Date   TSH 2.48 07/25/2016          Relevant Orders   TSH   Insomnia    Managed with low stable dose of alprazolam.  The risks and benefits of benzodiazepine use were reviewed with patient today including excessive sedation leading to respiratory depression,  impaired thinking/driving, memory loss and addiction.  Patient was advised to avoid concurrent use with alcohol, to use medication only as needed and  not to share with others  .       Generalized anxiety disorder    She has documented intolerance to buspirone, Paxil,  And lexapro.  She uses 0.125 mg alprazolam most days,  Increased her dose during the holidays to a full 0.25 mg tablet.  The risks and benefits of benzodiazepine use were reviewed with patient today including cognitive decline.       Relevant Medications   ALPRAZolam (XANAX) 0.25 MG tablet   Essential hypertension - Primary    she reports compliance with medication regimen  but has a very  elevated reading today in office 180/90 on my recheck).  sHe has been asked to check his BP at home and return on Thursdya ofr an RN visit with reading and home machine n. Renal function will be checked today  Lab Results  Component Value Date   CREATININE 0.74 09/30/2016   Lab Results  Component Value Date   NA 128 (L) 09/30/2016   K 4.2 09/30/2016   CL 94 (L) 09/30/2016   CO2 26 09/30/2016         Relevant Orders   Microalbumin / creatinine urine ratio (Completed)   Comp Met (CMET)    A total of 25 minutes of face to face time was spent with patient more than half of which was spent in counselling about  the above mentioned conditions  and coordination of care   I am having Earnest Bailey maintain her aspirin, Fluticasone-Salmeterol, cholecalciferol, Calcium Carbonate-Vitamin D (CALTRATE 600+D PO), multivitamin, vitamin E, fish oil-omega-3 fatty acids, albuterol, losartan, levothyroxine, amLODipine, traMADol, and ALPRAZolam.  Meds ordered this encounter  Medications  . ALPRAZolam (XANAX) 0.25 MG tablet    Sig: Take 1 tablet (0.25 mg total) by mouth 2 (two) times daily as needed.    Dispense:  60 tablet    Refill:  5    Medications Discontinued During This Encounter  Medication Reason  . ALPRAZolam (XANAX) 0.25 MG tablet Reorder    Follow-up: Return in about 6 months (around 11/09/2017), or RN visit for bp check Thursday , for hypertension .   Crecencio Mc, MD

## 2017-05-12 NOTE — Assessment & Plan Note (Addendum)
she reports compliance with medication regimen  but has a very  elevated reading today in office 180/90 on my recheck).  sHe has been asked to check his BP at home and return on Thursdya ofr an RN visit with reading and home machine n. Renal function will be checked today  Lab Results  Component Value Date   CREATININE 0.74 09/30/2016   Lab Results  Component Value Date   NA 128 (L) 09/30/2016   K 4.2 09/30/2016   CL 94 (L) 09/30/2016   CO2 26 09/30/2016

## 2017-05-12 NOTE — Assessment & Plan Note (Addendum)
She has documented intolerance to buspirone, Paxil,  And lexapro.  She uses 0.125 mg alprazolam most days,  Increased her dose during the holidays to a full 0.25 mg tablet.  The risks and benefits of benzodiazepine use were reviewed with patient today including cognitive decline.

## 2017-05-12 NOTE — Patient Instructions (Addendum)
Return on Thursday with a log of your home BP readings AND your machine .  If your home machine matches our BP reading on Friday ,  We can use your home readings to decide if you need stronger medication   Take your losartan bottle to your pharmacist to see if the Lot # matches thoses that have recalled .

## 2017-05-12 NOTE — Assessment & Plan Note (Signed)
Managed with low stable dose of alprazolam.  The risks and benefits of benzodiazepine use were reviewed with patient today including excessive sedation leading to respiratory depression,  impaired thinking/driving, memory loss and addiction.  Patient was advised to avoid concurrent use with alcohol, to use medication only as needed and not to share with others  .  

## 2017-05-13 NOTE — Assessment & Plan Note (Signed)
Thyroid function was WNL in April and is due for repeat assessment  Given elevated anxiety level and BP .  Marland Kitchen  Lab Results  Component Value Date   TSH 2.48 07/25/2016

## 2017-05-15 ENCOUNTER — Ambulatory Visit: Payer: Medicare Other

## 2017-05-16 ENCOUNTER — Ambulatory Visit (INDEPENDENT_AMBULATORY_CARE_PROVIDER_SITE_OTHER): Payer: Medicare Other | Admitting: *Deleted

## 2017-05-16 ENCOUNTER — Encounter: Payer: Self-pay | Admitting: Internal Medicine

## 2017-05-16 DIAGNOSIS — I10 Essential (primary) hypertension: Secondary | ICD-10-CM

## 2017-05-16 DIAGNOSIS — E039 Hypothyroidism, unspecified: Secondary | ICD-10-CM | POA: Diagnosis not present

## 2017-05-16 DIAGNOSIS — R809 Proteinuria, unspecified: Secondary | ICD-10-CM | POA: Insufficient documentation

## 2017-05-16 LAB — TSH: TSH: 2.46 u[IU]/mL (ref 0.35–4.50)

## 2017-05-16 LAB — COMPREHENSIVE METABOLIC PANEL
ALBUMIN: 4.1 g/dL (ref 3.5–5.2)
ALT: 20 U/L (ref 0–35)
AST: 27 U/L (ref 0–37)
Alkaline Phosphatase: 53 U/L (ref 39–117)
BILIRUBIN TOTAL: 0.4 mg/dL (ref 0.2–1.2)
BUN: 10 mg/dL (ref 6–23)
CALCIUM: 9.6 mg/dL (ref 8.4–10.5)
CO2: 29 mEq/L (ref 19–32)
Chloride: 93 mEq/L — ABNORMAL LOW (ref 96–112)
Creatinine, Ser: 0.73 mg/dL (ref 0.40–1.20)
GFR: 99.2 mL/min (ref >60.00)
Glucose, Bld: 98 mg/dL (ref 70–99)
Potassium: 3.8 mEq/L (ref 3.5–5.1)
Sodium: 129 mEq/L — ABNORMAL LOW (ref 135–145)
TOTAL PROTEIN: 7.9 g/dL (ref 6.0–8.3)

## 2017-05-16 NOTE — Progress Notes (Addendum)
Patient presented for BP follow up per PCP note patient taking amlodipine 10 mg in the am and taking losartan 100 mg in the evening around 7 , patient did state she has forgotten a couple of night time doses of losartan. Advised patient ok to take amlodipine and losartan together in the morning if she remembers morning medication better. Advised not to take with levothyroxine that this medication is to be taken alone before morning meal at least 45 minutes to 1 hour before meals or other medications. Patient was taking levothyroxine with meals and with amlodipine and aspirin. Patient BP left arm 158/88 pulse 82.   I have reviewed the above information and agree with above.  No dose adjustment until sh has been taking both BP meds for at least a week   Deborra Medina, MD

## 2017-05-18 ENCOUNTER — Other Ambulatory Visit: Payer: Self-pay | Admitting: Internal Medicine

## 2017-05-19 ENCOUNTER — Ambulatory Visit: Payer: Medicare Other

## 2017-05-20 ENCOUNTER — Other Ambulatory Visit: Payer: Self-pay | Admitting: Internal Medicine

## 2017-06-21 ENCOUNTER — Other Ambulatory Visit: Payer: Self-pay | Admitting: Internal Medicine

## 2017-06-23 NOTE — Telephone Encounter (Signed)
Refilled: 03/17/2017 Last OV: 05/12/2017 Next OV: not scheduled

## 2017-06-24 NOTE — Telephone Encounter (Signed)
Printed, signed and faxed.  

## 2017-07-29 ENCOUNTER — Telehealth: Payer: Self-pay

## 2017-07-29 MED ORDER — TRAMADOL HCL 50 MG PO TABS: 50.0000 mg | ORAL_TABLET | Freq: Two times a day (BID) | ORAL | 2 refills | Status: DC

## 2017-07-29 NOTE — Telephone Encounter (Signed)
Tramadol refilled for 3 months .  Needs appt in august

## 2017-07-29 NOTE — Telephone Encounter (Signed)
Refilled: 06/23/2017 Last OV: 05/12/2017 Next OV: not scheduled

## 2017-07-30 NOTE — Telephone Encounter (Signed)
Scheduled pt for a follow up appt with Dr. Derrel Nip on 11/03/2017 @ 10:00am.

## 2017-07-30 NOTE — Telephone Encounter (Signed)
Printed, signed and faxed.  

## 2017-08-02 ENCOUNTER — Other Ambulatory Visit: Payer: Self-pay | Admitting: Internal Medicine

## 2017-09-01 DIAGNOSIS — H2513 Age-related nuclear cataract, bilateral: Secondary | ICD-10-CM | POA: Diagnosis not present

## 2017-10-07 ENCOUNTER — Ambulatory Visit (INDEPENDENT_AMBULATORY_CARE_PROVIDER_SITE_OTHER): Payer: Medicare Other | Admitting: *Deleted

## 2017-10-07 DIAGNOSIS — Z111 Encounter for screening for respiratory tuberculosis: Secondary | ICD-10-CM

## 2017-10-07 NOTE — Progress Notes (Signed)
PPd test patient tolerated wellD

## 2017-10-09 ENCOUNTER — Ambulatory Visit: Payer: Medicare Other | Admitting: *Deleted

## 2017-10-09 DIAGNOSIS — Z111 Encounter for screening for respiratory tuberculosis: Secondary | ICD-10-CM

## 2017-10-09 LAB — TB SKIN TEST
Induration: 0 mm
TB Skin Test: NEGATIVE

## 2017-10-09 NOTE — Progress Notes (Signed)
PPD read

## 2017-10-31 ENCOUNTER — Other Ambulatory Visit: Payer: Self-pay | Admitting: Internal Medicine

## 2017-11-03 ENCOUNTER — Ambulatory Visit (INDEPENDENT_AMBULATORY_CARE_PROVIDER_SITE_OTHER): Payer: Medicare Other | Admitting: Internal Medicine

## 2017-11-03 ENCOUNTER — Encounter: Payer: Self-pay | Admitting: Internal Medicine

## 2017-11-03 VITALS — BP 136/72 | HR 68 | Temp 98.0°F | Resp 14 | Ht 63.0 in | Wt 129.6 lb

## 2017-11-03 DIAGNOSIS — E039 Hypothyroidism, unspecified: Secondary | ICD-10-CM | POA: Diagnosis not present

## 2017-11-03 DIAGNOSIS — Z86010 Personal history of colonic polyps: Secondary | ICD-10-CM | POA: Diagnosis not present

## 2017-11-03 DIAGNOSIS — I10 Essential (primary) hypertension: Secondary | ICD-10-CM

## 2017-11-03 DIAGNOSIS — F411 Generalized anxiety disorder: Secondary | ICD-10-CM

## 2017-11-03 DIAGNOSIS — M1732 Unilateral post-traumatic osteoarthritis, left knee: Secondary | ICD-10-CM | POA: Diagnosis not present

## 2017-11-03 DIAGNOSIS — Z1231 Encounter for screening mammogram for malignant neoplasm of breast: Secondary | ICD-10-CM

## 2017-11-03 DIAGNOSIS — K5909 Other constipation: Secondary | ICD-10-CM | POA: Diagnosis not present

## 2017-11-03 DIAGNOSIS — M546 Pain in thoracic spine: Secondary | ICD-10-CM

## 2017-11-03 DIAGNOSIS — R809 Proteinuria, unspecified: Secondary | ICD-10-CM

## 2017-11-03 DIAGNOSIS — M810 Age-related osteoporosis without current pathological fracture: Secondary | ICD-10-CM

## 2017-11-03 DIAGNOSIS — J4520 Mild intermittent asthma, uncomplicated: Secondary | ICD-10-CM | POA: Diagnosis not present

## 2017-11-03 DIAGNOSIS — M545 Low back pain: Secondary | ICD-10-CM

## 2017-11-03 LAB — COMPREHENSIVE METABOLIC PANEL
ALT: 18 U/L (ref 0–35)
AST: 25 U/L (ref 0–37)
Albumin: 4.1 g/dL (ref 3.5–5.2)
Alkaline Phosphatase: 52 U/L (ref 39–117)
BUN: 9 mg/dL (ref 6–23)
CALCIUM: 9.7 mg/dL (ref 8.4–10.5)
CHLORIDE: 95 meq/L — AB (ref 96–112)
CO2: 28 meq/L (ref 19–32)
Creatinine, Ser: 0.78 mg/dL (ref 0.40–1.20)
GFR: 91.78 mL/min (ref >60.00)
GLUCOSE: 98 mg/dL (ref 70–99)
POTASSIUM: 4.8 meq/L (ref 3.5–5.1)
Sodium: 128 mEq/L — ABNORMAL LOW (ref 135–145)
Total Bilirubin: 0.4 mg/dL (ref 0.2–1.2)
Total Protein: 8.1 g/dL (ref 6.0–8.3)

## 2017-11-03 LAB — TSH: TSH: 2.02 u[IU]/mL (ref 0.35–4.50)

## 2017-11-03 MED ORDER — TRAMADOL HCL 50 MG PO TABS: 50.0000 mg | ORAL_TABLET | Freq: Two times a day (BID) | ORAL | 5 refills | Status: DC

## 2017-11-03 MED ORDER — ALPRAZOLAM 0.25 MG PO TABS: 0.2500 mg | ORAL_TABLET | Freq: Two times a day (BID) | ORAL | 5 refills | Status: DC | PRN

## 2017-11-03 NOTE — Assessment & Plan Note (Signed)
Elevated secondray to white coat effect. home readings are consistently < 130/80  
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
## 2017-11-03 NOTE — Patient Instructions (Signed)
You are OVERDUE for colonoscopy , because your 2011 colonoscopy had a TUBULAR ADENOMA.    PLEASE  Consider accepting a referral ASAP  You do not need a stress test or ultrasound of neck or leg.s

## 2017-11-03 NOTE — Telephone Encounter (Signed)
Waiting on lab result.

## 2017-11-04 ENCOUNTER — Other Ambulatory Visit: Payer: Self-pay

## 2017-11-04 MED ORDER — LEVOTHYROXINE SODIUM 50 MCG PO TABS: ORAL_TABLET | ORAL | 1 refills | Status: DC

## 2017-11-04 NOTE — Assessment & Plan Note (Addendum)
Has not been taking alendronate since Nov 2016 due to concern  about developing OCN Jaw due to the  experience of a friend, despite discussion of the  actual incidence of OCN. Has refused   an alternative,  Exercising daily,  Taking Vit D and getting calcium 1800 mg daily . Last DEXA May 2016,  No change.  Repeat due.

## 2017-11-04 NOTE — Assessment & Plan Note (Signed)
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

## 2017-11-04 NOTE — Assessment & Plan Note (Signed)
Mild intermittent.  Has not used albuterol MDI in over 6 months

## 2017-11-04 NOTE — Assessment & Plan Note (Signed)
She has repeatedly deferred repeat colonoscopy , again on today's discussion and advice

## 2017-11-04 NOTE — Assessment & Plan Note (Signed)
Thyroid function is WNL on current dose.  No current changes needed.   Lab Results  Component Value Date   TSH 2.02 11/03/2017
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
## 2017-11-04 NOTE — Assessment & Plan Note (Signed)
Continue losartan for BP control

## 2017-11-04 NOTE — Assessment & Plan Note (Signed)
She has documented intolerance to buspirone, Paxil,  And lexapro.  She uses 0.125 mg alprazolam most days in the morning  And a full 0.25 mg at bedtime .  The risks and benefits of benzodiazepine use were reviewed with patient today including cognitive decline.

## 2017-11-04 NOTE — Assessment & Plan Note (Signed)
Managed with tylenol and tramadol

## 2017-11-07 ENCOUNTER — Other Ambulatory Visit: Payer: Self-pay | Admitting: Internal Medicine

## 2017-11-07 DIAGNOSIS — Z86010 Personal history of colonic polyps: Secondary | ICD-10-CM

## 2017-12-26 ENCOUNTER — Encounter: Payer: Self-pay | Admitting: Internal Medicine

## 2017-12-29 ENCOUNTER — Ambulatory Visit: Payer: Medicare Other

## 2017-12-30 ENCOUNTER — Ambulatory Visit (INDEPENDENT_AMBULATORY_CARE_PROVIDER_SITE_OTHER): Payer: Medicare Other | Admitting: General Surgery

## 2017-12-30 ENCOUNTER — Encounter: Payer: Self-pay | Admitting: General Surgery

## 2017-12-30 VITALS — BP 124/68 | HR 74 | Resp 12 | Ht 62.0 in | Wt 129.0 lb

## 2017-12-30 DIAGNOSIS — Z86010 Personal history of colonic polyps: Secondary | ICD-10-CM

## 2017-12-30 MED ORDER — BISACODYL 5 MG PO TBEC: DELAYED_RELEASE_TABLET | ORAL | 0 refills | Status: DC

## 2017-12-30 MED ORDER — POLYETHYLENE GLYCOL 3350 17 GM/SCOOP PO POWD: 1.0000 | Freq: Once | ORAL | 0 refills | Status: AC

## 2017-12-30 NOTE — Progress Notes (Signed)
Patient ID: Holly Perez, female   DOB: [birth year], 77 y.o.   MRN: [id number]  Chief Complaint  Patient presents with  . Colonoscopy    HPI Holly Perez is a 77 y.o. female here today for a evaluation of a colonoscopy. Last colonoscopy was in 12/06/2009. Patient states no GI problems at this time. Moves her bowels every two days.  HPI  Past Medical History:  Diagnosis Date  . Asthma   . Colon polyp 03/07/2010   2 mm tubular adenoma the ascending colon. Dr. Dionne Milo.   . Hemorrhoids   . Hypertension   . Irritable bowel syndrome (IBS)     Past Surgical History:  Procedure Laterality Date  . ABDOMINAL HYSTERECTOMY    . COLONOSCOPY  2011   Dr. Ernst Breach: 66mm tubular adenoma of the ascending colon without dysplasia or malignancy.  . ECTOPIC PREGNANCY SURGERY     at age 55  . OVARY SURGERY  1974   tumor    Family History  Problem Relation Age of Onset  . Early death Mother   . COPD Father   . Early death Brother   . Breast cancer Neg Hx     Social History Social History   Tobacco Use  . Smoking status: Never Smoker  . Smokeless tobacco: Never Used  Substance Use Topics  . Alcohol use: No  . Drug use: No    Allergies  Allergen Reactions  . Augmentin [Amoxicillin-Pot Clavulanate] Nausea And Vomiting  . Latex     Current Outpatient Medications  Medication Sig Dispense Refill  . albuterol (PROVENTIL HFA;VENTOLIN HFA) 108 (90 BASE) MCG/ACT inhaler Inhale into the lungs every 6 (six) hours as needed for wheezing or shortness of breath.    . ALPRAZolam (XANAX) 0.25 MG tablet Take 1 tablet (0.25 mg total) by mouth 2 (two) times daily as needed. 60 tablet 5  . amLODipine (NORVASC) 10 MG tablet TAKE 1 TABLET(10 MG) BY MOUTH DAILY 90 tablet 1  . aspirin 81 MG tablet Take 81 mg by mouth daily.    . Calcium Carbonate-Vitamin D (CALTRATE 600+D PO) Take 1,200 mg by mouth daily.     . cholecalciferol (VITAMIN D) 1000 UNITS tablet Take 2,000 Units by mouth daily.    . fish  oil-omega-3 fatty acids 1000 MG capsule Take 1 g by mouth daily.     . Fluticasone-Salmeterol (ADVAIR) 250-50 MCG/DOSE AEPB Inhale 1 puff into the lungs every 12 (twelve) hours.    Marland Kitchen levothyroxine (SYNTHROID, LEVOTHROID) 50 MCG tablet TAKE 1 TABLET BY MOUTH EVERY DAY BEFORE BREAKFAST 90 tablet 1  . losartan (COZAAR) 100 MG tablet TAKE 1 TABLET(100 MG) BY MOUTH DAILY 90 tablet 1  . Multiple Vitamin (MULTIVITAMIN) tablet Take 1 tablet by mouth daily.    . traMADol (ULTRAM) 50 MG tablet Take 1 tablet (50 mg total) by mouth 2 (two) times daily. For moderate pain 60 tablet 5  . vitamin E 100 UNIT capsule Take 100 Units by mouth daily.    . bisacodyl (DULCOLAX) 5 MG EC tablet Take 2 tablets in the morning and 2 tablets in the evening 2 days prior to your colonoscopy 4 tablet 0   No current facility-administered medications for this visit.     Review of Systems Review of Systems  Constitutional: Negative.   Respiratory: Negative.   Cardiovascular: Negative.   Gastrointestinal: Negative.     Blood pressure 124/68, pulse 74, resp. rate 12, height 5\' 2"  (1.575 m), weight 129 lb (58.5 kg).  Physical Exam Physical Exam  Constitutional: She is oriented to person, place, and time. She appears well-developed and well-nourished.  Cardiovascular: Normal rate, regular rhythm and normal heart sounds.  Pulmonary/Chest: Effort normal and breath sounds normal.  Neurological: She is alert and oriented to person, place, and time.  Skin: Skin is warm and dry.    Data Reviewed 2011 colonoscopy report reviewed.  Study completed for heme positive stool and a personal history of colonic polyps.  1016, 2015 office assessment for suspected his prior episode of diverticulitis.  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  No imaging studies for review.  CBC of December 01, 2014 showed a stable hemoglobin of 12.1 with an MCV of 88.8, white blood  cell count of 4900 and a platelet count of 366,000.  Conference of metabolic panel of November 03, 2017 reviewed.  Verbal for chronic low sodium at 128.  Chloride 95.  No change over the last 18 months.  Creatinine of 0.78.  Normal liver function studies. PCP notes of November 04, 2017 reviewed.  Assessment    Candidate for repeat colonoscopy.  Modest chronic hyponatremia.  No history of diuretic usage.  Plan  The patient has been reluctant to have a follow-up exam, due in 2016.  She is still somewhat cool to the idea.  We reviewed options for management including Cologuard (not approved for patients with previous polyps, but better than no exam in all) versus colonoscopy.  The absence of blood on visual inspection is certainly not rule out a significant process upstream.  Patient has reluctantly agreed to proceed to colonoscopy.  As she occasionally has some mild constipation, will make use of Dulcolax 1 day prior to the prep.  Colonoscopy with possible biopsy/polypectomy prn: Information regarding the procedure, including its potential risks and complications (including but not limited to perforation of the bowel, which may require emergency surgery to repair, and bleeding) was verbally given to the patient. Educational information regarding lower intestinal endoscopy was given to the patient. Written instructions for how to complete the bowel prep using Miralax were provided. The importance of drinking ample fluids to avoid dehydration as a result of the prep emphasized.  HPI, Physical Exam, Assessment and Plan have been scribed under the direction and in the presence of Hervey Ard, MD.  Gaspar Cola, CMA  I have completed the exam and reviewed the above documentation for accuracy and completeness.  I agree with the above.  Haematologist has been used and any errors in dictation or transcription are unintentional.  Hervey Ard, M.D., F.A.C.S.  The patient is scheduled for a  Colonoscopy at [facility] on 01/21/18. They are aware to call the day before to get their arrival time. She will stop her Fish Oil one week prior. She will take 52md Dulcolax the morning and evening 2 days prior to her exam. She will only take her amlodipine at 6 am the morning of. Miralax prescription has been sent into the patient's pharmacy. The patient is aware of date and instructions.  Documented by Caryl-Lyn Otis Brace LPN   Holly Perez 12/31/2017, 8:21 AM

## 2017-12-30 NOTE — Patient Instructions (Addendum)
The patient is scheduled for a Colonoscopy at [facility] on 01/21/18. They are aware to call the day before to get their arrival time. She will stop her Fish Oil one week prior. She will take 4md Dulcolax the morning and evening 2 days prior to her exam. She will only take her amlodipine at 6 am the morning of. Miralax prescription has been sent into the patient's pharmacy. The patient is aware of date and instructions.     Colonoscopy, Adult A colonoscopy is an exam to look at the large intestine. It is done to check for problems, such as:  Lumps (tumors).  Growths (polyps).  Swelling (inflammation).  Bleeding.  What happens before the procedure? Eating and drinking Follow instructions from your doctor about eating and drinking. These instructions may include:  A few days before the procedure - follow a low-fiber diet. ? Avoid nuts. ? Avoid seeds. ? Avoid dried fruit. ? Avoid raw fruits. ? Avoid vegetables.  1-3 days before the procedure - follow a clear liquid diet. Avoid liquids that have red or purple dye. Drink only clear liquids, such as: ? Clear broth or bouillon. ? Black coffee or tea. ? Clear juice. ? Clear soft drinks or sports drinks. ? Gelatin dessert. ? Popsicles.  On the day of the procedure - do not eat or drink anything during the 2 hours before the procedure.  Bowel prep If you were prescribed an oral bowel prep:  Take it as told by your doctor. Starting the day before your procedure, you will need to drink a lot of liquid. The liquid will cause you to poop (have bowel movements) until your poop is almost clear or light green.  If your skin or butt gets irritated from diarrhea, you may: ? Wipe the area with wipes that have medicine in them, such as adult wet wipes with aloe and vitamin E. ? Put something on your skin that soothes the area, such as petroleum jelly.  If you throw up (vomit) while drinking the bowel prep, take a break for up to 60 minutes. Then  begin the bowel prep again. If you keep throwing up and you cannot take the bowel prep without throwing up, call your doctor.  General instructions  Ask your doctor about changing or stopping your normal medicines. This is important if you take diabetes medicines or blood thinners.  Plan to have someone take you home from the hospital or clinic. What happens during the procedure?  An IV tube may be put into one of your veins.  You will be given medicine to help you relax (sedative).  To reduce your risk of infection: ? Your doctors will wash their hands. ? Your anal area will be washed with soap.  You will be asked to lie on your side with your knees bent.  Your doctor will get a long, thin, flexible tube ready. The tube will have a camera and a light on the end.  The tube will be put into your anus.  The tube will be gently put into your large intestine.  Air will be delivered into your large intestine to keep it open. You may feel some pressure or cramping.  The camera will be used to take photos.  A small tissue sample may be removed from your body to be looked at under a microscope (biopsy). If any possible problems are found, the tissue will be sent to a lab for testing.  If small growths are found, your doctor may  remove them and have them checked for cancer.  The tube that was put into your anus will be slowly removed. The procedure may vary among doctors and hospitals. What happens after the procedure?  Your doctor will check on you often until the medicines you were given have worn off.  Do not drive for 24 hours after the procedure.  You may have a small amount of blood in your poop.  You may pass gas.  You may have mild cramps or bloating in your belly (abdomen).  It is up to you to get the results of your procedure. Ask your doctor, or the department performing the procedure, when your results will be ready. This information is not intended to replace  advice given to you by your health care provider. Make sure you discuss any questions you have with your health care provider. Document Released: 04/20/2010 Document Revised: 01/17/2016 Document Reviewed: 05/30/2015 Elsevier Interactive Patient Education  2017 [employer].

## 2017-12-31 ENCOUNTER — Telehealth: Payer: Self-pay

## 2017-12-31 ENCOUNTER — Encounter: Payer: Self-pay | Admitting: General Surgery

## 2017-12-31 NOTE — Telephone Encounter (Signed)
Patient called and would like to reschedule her colonoscopy scheduled for 01/21/18. It is now scheduled for 01/14/18 at [facility] with Dr Bary Castilla. She is aware of date and instructions.

## 2018-01-05 ENCOUNTER — Ambulatory Visit (INDEPENDENT_AMBULATORY_CARE_PROVIDER_SITE_OTHER): Payer: Medicare Other

## 2018-01-05 DIAGNOSIS — Z23 Encounter for immunization: Secondary | ICD-10-CM

## 2018-01-13 ENCOUNTER — Encounter: Payer: Self-pay | Admitting: *Deleted

## 2018-01-14 ENCOUNTER — Encounter
Admission: RE | Disposition: A | Discharge status: Home / Self Care | Payer: Self-pay | Source: Ambulatory Visit | Attending: General Surgery

## 2018-01-14 ENCOUNTER — Ambulatory Visit: Payer: Medicare Other | Admitting: Certified Registered Nurse Anesthetist

## 2018-01-14 ENCOUNTER — Encounter: Payer: Self-pay | Admitting: Certified Registered Nurse Anesthetist

## 2018-01-14 ENCOUNTER — Ambulatory Visit
Admission: RE | Admit: 2018-01-14 | Discharge: 2018-01-14 | Disposition: A | Discharge status: Home / Self Care | Payer: Medicare Other | Source: Ambulatory Visit | Attending: General Surgery | Admitting: General Surgery

## 2018-01-14 DIAGNOSIS — Z86010 Personal history of colonic polyps: Secondary | ICD-10-CM | POA: Diagnosis not present

## 2018-01-14 DIAGNOSIS — Z825 Family history of asthma and other chronic lower respiratory diseases: Secondary | ICD-10-CM | POA: Insufficient documentation

## 2018-01-14 DIAGNOSIS — K5790 Diverticulosis of intestine, part unspecified, without perforation or abscess without bleeding: Secondary | ICD-10-CM | POA: Diagnosis not present

## 2018-01-14 DIAGNOSIS — Z7982 Long term (current) use of aspirin: Secondary | ICD-10-CM | POA: Diagnosis not present

## 2018-01-14 DIAGNOSIS — Z79899 Other long term (current) drug therapy: Secondary | ICD-10-CM | POA: Diagnosis not present

## 2018-01-14 DIAGNOSIS — D122 Benign neoplasm of ascending colon: Secondary | ICD-10-CM | POA: Diagnosis not present

## 2018-01-14 DIAGNOSIS — Z881 Allergy status to other antibiotic agents status: Secondary | ICD-10-CM | POA: Insufficient documentation

## 2018-01-14 DIAGNOSIS — Z7951 Long term (current) use of inhaled steroids: Secondary | ICD-10-CM | POA: Diagnosis not present

## 2018-01-14 DIAGNOSIS — Z91040 Latex allergy status: Secondary | ICD-10-CM | POA: Diagnosis not present

## 2018-01-14 DIAGNOSIS — D123 Benign neoplasm of transverse colon: Secondary | ICD-10-CM | POA: Diagnosis not present

## 2018-01-14 DIAGNOSIS — K635 Polyp of colon: Secondary | ICD-10-CM | POA: Diagnosis not present

## 2018-01-14 DIAGNOSIS — Z1211 Encounter for screening for malignant neoplasm of colon: Secondary | ICD-10-CM | POA: Diagnosis not present

## 2018-01-14 DIAGNOSIS — K589 Irritable bowel syndrome without diarrhea: Secondary | ICD-10-CM | POA: Diagnosis not present

## 2018-01-14 DIAGNOSIS — K5730 Diverticulosis of large intestine without perforation or abscess without bleeding: Secondary | ICD-10-CM | POA: Diagnosis not present

## 2018-01-14 DIAGNOSIS — J45909 Unspecified asthma, uncomplicated: Secondary | ICD-10-CM | POA: Diagnosis not present

## 2018-01-14 DIAGNOSIS — I10 Essential (primary) hypertension: Secondary | ICD-10-CM | POA: Diagnosis not present

## 2018-01-14 DIAGNOSIS — Z90710 Acquired absence of both cervix and uterus: Secondary | ICD-10-CM | POA: Diagnosis not present

## 2018-01-14 DIAGNOSIS — D126 Benign neoplasm of colon, unspecified: Secondary | ICD-10-CM | POA: Diagnosis not present

## 2018-01-14 HISTORY — PX: COLONOSCOPY WITH PROPOFOL: SHX5780

## 2018-01-14 SURGERY — COLONOSCOPY WITH PROPOFOL
Anesthesia: General

## 2018-01-14 MED ORDER — PROPOFOL 10 MG/ML IV BOLUS
INTRAVENOUS | Status: DC | PRN
  Administered 2018-01-14: 30 mg via INTRAVENOUS
  Administered 2018-01-14: 20 mg via INTRAVENOUS

## 2018-01-14 MED ORDER — LIDOCAINE HCL (CARDIAC) PF 100 MG/5ML IV SOSY
PREFILLED_SYRINGE | INTRAVENOUS | Status: DC | PRN
  Administered 2018-01-14: 40 mg via INTRAVENOUS

## 2018-01-14 MED ORDER — SODIUM CHLORIDE 0.9 % IV SOLN
INTRAVENOUS | Status: DC
  Administered 2018-01-14: 08:00:00 via INTRAVENOUS
  Administered 2018-01-14: 1000 mL via INTRAVENOUS

## 2018-01-14 MED ORDER — PROPOFOL 10 MG/ML IV BOLUS
INTRAVENOUS | Status: AC
  Filled 2018-01-14: qty 20

## 2018-01-14 MED ORDER — PROPOFOL 500 MG/50ML IV EMUL
INTRAVENOUS | Status: AC
  Filled 2018-01-14: qty 50

## 2018-01-14 MED ORDER — LIDOCAINE HCL (PF) 1 % IJ SOLN
INTRAMUSCULAR | Status: AC
  Administered 2018-01-14: 0.3 mL
  Filled 2018-01-14: qty 2

## 2018-01-14 MED ORDER — PROPOFOL 500 MG/50ML IV EMUL
INTRAVENOUS | Status: DC | PRN
  Administered 2018-01-14: 85 ug/kg/min via INTRAVENOUS

## 2018-01-14 NOTE — Op Note (Signed)
[facility] Gastroenterology Patient Name: Holly Perez Procedure Date: 01/14/2018 7:25 AM MRN: [id number] Account #: [id number] Date of Birth: [birth year] Admit Type: Outpatient Age: 77 Room: [facility] ENDO ROOM 1 Gender: Female Note Status: Finalized Procedure:            Colonoscopy Indications:          High risk colon cancer surveillance: Personal history                        of colonic polyps Providers:            Robert Bellow, MD Referring MD:         Deborra Medina, MD (Referring MD) Medicines:            Monitored Anesthesia Care Complications:        No immediate complications. Procedure:            Pre-Anesthesia Assessment:                       - Prior to the procedure, a History and Physical was                        performed, and patient medications, allergies and                        sensitivities were reviewed. The patient's tolerance of                        previous anesthesia was reviewed.                       - The risks and benefits of the procedure and the                        sedation options and risks were discussed with the                        patient. All questions were answered and informed                        consent was obtained.                       After obtaining informed consent, the colonoscope was                        passed under direct vision. Throughout the procedure,                        the patient's blood pressure, pulse, and oxygen                        saturations were monitored continuously. The                        Colonoscope was introduced through the anus and                        advanced to the the cecum, identified by appendiceal  orifice and ileocecal valve. The Colonoscope was                        introduced through the and advanced to the. The                        colonoscopy was performed without difficulty. The                        patient tolerated the  procedure well. The quality of                        the bowel preparation was excellent. Findings:      Two sessile polyps were found in the ascending colon. The polyps were 5       to 8 mm in size. These were biopsied with a cold forceps for histology.      A 5 mm polyp was found in the transverse colon. The polyp was sessile.       Biopsies were taken with a cold forceps for histology.      Multiple medium-mouthed diverticula were found in the sigmoid colon and       transverse colon.      The retroflexed view of the distal rectum and anal verge was normal and       showed no anal or rectal abnormalities. Impression:           - Two 5 to 8 mm polyps in the ascending colon. Biopsied.                       - One 5 mm polyp in the transverse colon. Biopsied.                       - Diverticulosis in the sigmoid colon and in the                        transverse colon.                       - The distal rectum and anal verge are normal on                        retroflexion view. Recommendation:       - Telephone endoscopist for pathology results in 1 week. Procedure Code(s):    --- Professional ---                       [phone], Colonoscopy, flexible; with biopsy, single or                        multiple Diagnosis Code(s):    --- Professional ---                       Z86.010, Personal history of colonic polyps                       D12.2, Benign neoplasm of ascending colon                       D12.3, Benign neoplasm of transverse colon (hepatic  flexure or splenic flexure)                       K57.30, Diverticulosis of large intestine without                        perforation or abscess without bleeding CPT copyright 2018 American Medical Association. All rights reserved. The codes documented in this report are preliminary and upon coder review may  be revised to meet current compliance requirements. Robert Bellow, MD 01/14/2018 8:17:25 AM This report  has been signed electronically. Number of Addenda: 0 Note Initiated On: 01/14/2018 7:25 AM Scope Withdrawal Time: 0 hours 12 minutes 55 seconds  Total Procedure Duration: 0 hours 25 minutes 3 seconds       [facility]

## 2018-01-14 NOTE — Anesthesia Preprocedure Evaluation (Signed)
Anesthesia Evaluation  Patient identified by MRN, date of birth, ID band Patient awake    Reviewed: Allergy & Precautions, H&P , NPO status , Patient's Chart, lab work & pertinent test results, reviewed documented beta blocker date and time   History of Anesthesia Complications Negative for: history of anesthetic complications  Airway Mallampati: III  TM Distance: >3 FB Neck ROM: full  Mouth opening: Limited Mouth Opening  Dental  (+) Dental Advidsory Given, Partial Lower   Pulmonary neg shortness of breath, asthma (allergen induced) , neg sleep apnea, neg COPD, neg recent URI,           Cardiovascular Exercise Tolerance: Good hypertension, (-) angina(-) CAD, (-) Past MI, (-) Cardiac Stents and (-) CABG Normal cardiovascular exam(-) dysrhythmias (-) Valvular Problems/Murmurs     Neuro/Psych negative neurological ROS  negative psych ROS   GI/Hepatic negative GI ROS, Neg liver ROS,   Endo/Other  negative endocrine ROS  Renal/GU negative Renal ROS  negative genitourinary   Musculoskeletal   Abdominal   Peds  Hematology negative hematology ROS (+)   Anesthesia Other Findings Past Medical History: No date: Asthma 03/07/2010: Colon polyp     Comment:  2 mm tubular adenoma the ascending colon. Dr. Dionne Milo.  No date: Hemorrhoids No date: Hypertension No date: Irritable bowel syndrome (IBS)   Reproductive/Obstetrics negative OB ROS                             Anesthesia Physical Anesthesia Plan  ASA: II  Anesthesia Plan: General   Post-op Pain Management:    Induction: Intravenous  PONV Risk Score and Plan:   Airway Management Planned: Natural Airway and Nasal Cannula  Additional Equipment:   Intra-op Plan:   Post-operative Plan:   Informed Consent: I have reviewed the patients History and Physical, chart, labs and discussed the procedure including the risks, benefits and  alternatives for the proposed anesthesia with the patient or authorized representative who has indicated his/her understanding and acceptance.   Dental Advisory Given  Plan Discussed with: Anesthesiologist, CRNA and Surgeon  Anesthesia Plan Comments:         Anesthesia Quick Evaluation

## 2018-01-14 NOTE — Anesthesia Postprocedure Evaluation (Signed)
Anesthesia Post Note  Patient: HAEDYN BREAU  Procedure(s) Performed: COLONOSCOPY WITH PROPOFOL (N/A )  Patient location during evaluation: Endoscopy Anesthesia Type: General Level of consciousness: awake and alert Pain management: pain level controlled Vital Signs Assessment: post-procedure vital signs reviewed and stable Respiratory status: spontaneous breathing, nonlabored ventilation, respiratory function stable and patient connected to nasal cannula oxygen Cardiovascular status: blood pressure returned to baseline and stable Postop Assessment: no apparent nausea or vomiting Anesthetic complications: no     Last Vitals:  Vitals:   01/14/18 0836 01/14/18 0842  BP: 122/65 (!) 145/70  Pulse:    Resp:    Temp:    SpO2:      Last Pain:  Vitals:   01/14/18 0901  TempSrc:   PainSc: 0-No pain                 Martha Clan

## 2018-01-14 NOTE — Transfer of Care (Signed)
Immediate Anesthesia Transfer of Care Note  Patient: Holly Perez  Procedure(s) Performed: COLONOSCOPY WITH PROPOFOL (N/A )  Patient Location: PACU  Anesthesia Type:MAC  Level of Consciousness: awake, alert , oriented and patient cooperative  Airway & Oxygen Therapy: Patient Spontanous Breathing  Post-op Assessment: Report given to RN and Post -op Vital signs reviewed and stable  Post vital signs: stable  Last Vitals:  Vitals Value Taken Time  BP 115/61 01/14/2018  8:18 AM  Temp 36.1 C 01/14/2018  8:17 AM  Pulse 77 01/14/2018  8:17 AM  Resp 17 01/14/2018  8:17 AM  SpO2 100 % 01/14/2018  8:17 AM  Vitals shown include unvalidated device data.  Last Pain:  Vitals:   01/14/18 0817  TempSrc: Tympanic  PainSc: 0-No pain         Complications: No apparent anesthesia complications

## 2018-01-14 NOTE — H&P (Signed)
No change in clinical history or exam. Tolerated prep well. For colonoscopy to f/u prior adenomatous polyp.

## 2018-01-14 NOTE — Anesthesia Post-op Follow-up Note (Signed)
Anesthesia QCDR form completed.        

## 2018-01-15 ENCOUNTER — Encounter: Payer: Self-pay | Admitting: General Surgery

## 2018-01-15 LAB — SURGICAL PATHOLOGY

## 2018-01-20 ENCOUNTER — Telehealth: Payer: Self-pay

## 2018-01-20 NOTE — Telephone Encounter (Signed)
Message left for patient to call for results. 

## 2018-01-20 NOTE — Telephone Encounter (Signed)
-----   Message from Robert Bellow, MD sent at 01/20/2018  8:51 AM EDT ----- Please notify the patient that the three polyps removed did not have any cancer.  I would recommend she have a repeat exam in three years. Thanks.  ----- Message ----- From: Interface, Lab In Three Zero One Sent: 01/15/2018   9:57 AM EDT To: Robert Bellow, MD

## 2018-01-21 NOTE — Telephone Encounter (Signed)
Patient called the office and was notified as instructed. She verbalizes understanding.

## 2018-01-28 ENCOUNTER — Other Ambulatory Visit: Payer: Self-pay | Admitting: Internal Medicine

## 2018-01-30 ENCOUNTER — Ambulatory Visit: Payer: Medicare Other

## 2018-02-04 ENCOUNTER — Ambulatory Visit (INDEPENDENT_AMBULATORY_CARE_PROVIDER_SITE_OTHER): Payer: Medicare Other

## 2018-02-04 VITALS — BP 132/70 | HR 82 | Temp 98.2°F | Resp 14 | Ht 62.0 in | Wt 129.4 lb

## 2018-02-04 DIAGNOSIS — Z0000 Encounter for general adult medical examination without abnormal findings: Secondary | ICD-10-CM | POA: Diagnosis not present

## 2018-02-04 NOTE — Progress Notes (Addendum)
Subjective:   Holly Perez is a 77 y.o. female who presents for Medicare Annual (Subsequent) preventive examination.  Review of Systems:  No ROS.  Medicare Wellness Visit. Additional risk factors are reflected in the social history. Cardiac Risk Factors include: advanced age (>50men, >74 women);hypertension     Objective:     Vitals: BP 132/70 (BP Location: Left Arm, Patient Position: Sitting, Cuff Size: Normal)   Pulse 82   Temp 98.2 F (36.8 C) (Oral)   Resp 14   Ht 5\' 2"  (1.575 m)   Wt 129 lb 6.4 oz (58.7 kg)   SpO2 99%   BMI 23.67 kg/m   Body mass index is 23.67 kg/m.  Advanced Directives 01/14/2018 12/26/2016 12/26/2015  Does Patient Have a Medical Advance Directive? Yes No No  Type of Paramedic of [place];Living will - -  Copy of [place] in Chart? No - copy requested - -  Would patient like information on creating a medical advance directive? - No - Patient declined No - patient declined information    Tobacco Social History   Tobacco Use  Smoking Status Never Smoker  Smokeless Tobacco Never Used     Counseling given: Not Answered   Clinical Intake:  Pre-visit preparation completed: Yes  Pain : No/denies pain     Nutritional Status: BMI of 19-24  Normal Diabetes: No  How often do you need to have someone help you when you read instructions, pamphlets, or other written materials from your doctor or pharmacy?: 1 - Never  Interpreter Needed?: No     Past Medical History:  Diagnosis Date  . Asthma   . Colon polyp 03/07/2010   2 mm tubular adenoma the ascending colon. Dr. Dionne Milo.   . Hemorrhoids   . Hypertension   . Irritable bowel syndrome (IBS)    Past Surgical History:  Procedure Laterality Date  . ABDOMINAL HYSTERECTOMY    . COLONOSCOPY  2011   Dr. Ernst Breach: 5mm tubular adenoma of the ascending colon without dysplasia or malignancy.  . COLONOSCOPY WITH PROPOFOL N/A 01/14/2018   Procedure: COLONOSCOPY WITH PROPOFOL;  Surgeon: Robert Bellow, MD;  Location: [facility] ENDOSCOPY;  Service: Endoscopy;  Laterality: N/A;  . ECTOPIC PREGNANCY SURGERY     at age 30  . OVARY SURGERY  1974   tumor   Family History  Problem Relation Age of Onset  . Early death Mother   . COPD Father   . Early death Brother   . Breast cancer Neg Hx    Social History   Socioeconomic History  . Marital status: Divorced    Spouse name: Not on file  . Number of children: Not on file  . Years of education: Not on file  . Highest education level: Not on file  Occupational History  . Not on file  Social Needs  . Financial resource strain: Not hard at all  . Food insecurity:    Worry: Never true    Inability: Never true  . Transportation needs:    Medical: No    Non-medical: No  Tobacco Use  . Smoking status: Never Smoker  . Smokeless tobacco: Never Used  Substance and Sexual Activity  . Alcohol use: No  . Drug use: No  . Sexual activity: Never  Lifestyle  . Physical activity:    Days per week: 0 days    Minutes per session: Not on file  . Stress: Not at all  Relationships  .  Social connections:    Talks on phone: Not on file    Gets together: Not on file    Attends religious service: Not on file    Active member of club or organization: Not on file    Attends meetings of clubs or organizations: Not on file    Relationship status: Not on file  Other Topics Concern  . Not on file  Social History Narrative  . Not on file    Outpatient Encounter Medications as of 02/04/2018  Medication Sig  . albuterol (PROVENTIL HFA;VENTOLIN HFA) 108 (90 BASE) MCG/ACT inhaler Inhale into the lungs every 6 (six) hours as needed for wheezing or shortness of breath.  . ALPRAZolam (XANAX) 0.25 MG tablet Take 1 tablet (0.25 mg total) by mouth 2 (two) times daily as needed.  Marland Kitchen amLODipine (NORVASC) 10 MG tablet TAKE 1 TABLET(10 MG) BY MOUTH DAILY  . aspirin 81 MG tablet Take 81 mg by mouth  daily.  . Calcium Carbonate-Vitamin D (CALTRATE 600+D PO) Take 1,200 mg by mouth daily.   . cholecalciferol (VITAMIN D) 1000 UNITS tablet Take 2,000 Units by mouth daily.  . fish oil-omega-3 fatty acids 1000 MG capsule Take 1 g by mouth daily.   . Fluticasone-Salmeterol (ADVAIR) 250-50 MCG/DOSE AEPB Inhale 1 puff into the lungs every 12 (twelve) hours.  Marland Kitchen levothyroxine (SYNTHROID, LEVOTHROID) 50 MCG tablet TAKE 1 TABLET BY MOUTH EVERY DAY BEFORE BREAKFAST  . losartan (COZAAR) 100 MG tablet TAKE 1 TABLET(100 MG) BY MOUTH DAILY  . Multiple Vitamin (MULTIVITAMIN) tablet Take 1 tablet by mouth daily.  . traMADol (ULTRAM) 50 MG tablet Take 1 tablet (50 mg total) by mouth 2 (two) times daily. For moderate pain  . vitamin E 100 UNIT capsule Take 100 Units by mouth daily.   No facility-administered encounter medications on file as of 02/04/2018.     Activities of Daily Living In your present state of health, do you have any difficulty performing the following activities: 02/04/2018  Hearing? N  Vision? N  Difficulty concentrating or making decisions? N  Walking or climbing stairs? N  Dressing or bathing? N  Doing errands, shopping? N  Preparing Food and eating ? N  Using the Toilet? N  In the past six months, have you accidently leaked urine? N  Do you have problems with loss of bowel control? N  Managing your Medications? N  Managing your Finances? N  Housekeeping or managing your Housekeeping? N  Some recent data might be hidden    Patient Care Team: Crecencio Mc, MD as PCP - General (Internal Medicine) Crecencio Mc, MD (Internal Medicine) Bary Castilla Forest Gleason, MD (General Surgery)    Assessment:   This is a routine wellness examination for Holly Perez.  The goal of the wellness visit is to assist the patient how to close the gaps in care and create a preventative care plan for the patient.   The roster of all physicians providing medical care to patient is listed in the Snapshot  section of the chart.  Taking calcium VIT D as appropriate/Osteoporosis  reviewed.    Safety issues reviewed; Smoke and carbon monoxide detectors in the home. No firearms in the home. Wears seatbelts when driving or riding with others. No violence in the home.  They do not have excessive sun exposure.  Discussed the need for sun protection: hats, long sleeves and the use of sunscreen if there is significant sun exposure.  Patient is alert, normal appearance, oriented to person/place/and time.  Correctly identified the president of the [place] and recalls of 2/3 words.Performs simple calculations and can read correct time from watch face.  Displays appropriate judgement.  No new identified risk were noted.  No failures at ADL's or IADL's.    BMI- discussed the importance of a healthy diet, water intake and the benefits of aerobic exercise. Educational material provided.   24 hour diet recall: Low carb. Healthy diet.   Dental- every 24 months.  Eye- Visual acuity not assessed per patient preference since they have regular follow up with the ophthalmologist.  Wears corrective lenses.  Sleep patterns- Sleeps well at night.   Medication-taking as directed.   Health maintenance gaps- closed.  Patient Concerns: None at this time. Follow up with PCP as needed.  Exercise Activities and Dietary recommendations Current Exercise Habits: Home exercise routine, Type of exercise: walking, Time (Minutes): 30, Frequency (Times/Week): 7, Weekly Exercise (Minutes/Week): 210, Intensity: Moderate  Goals      Patient Stated   . Increase physical activity (pt-stated)     Add a senior exercise program to walking routine    . Weigh 125lb (pt-stated)       Fall Risk Fall Risk  02/04/2018 12/26/2016 07/25/2016 12/26/2015 12/01/2014  Falls in the past year? 0 No No No No  Number falls in past yr: - - - - -  Injury with Fall? - - - - -   Depression Screen PHQ 2/9 Scores 02/04/2018 12/26/2016 07/25/2016  12/26/2015  PHQ - 2 Score 0 0 0 1     Cognitive Function MMSE - Mini Mental State Exam 02/04/2018 12/26/2016 12/26/2015  Orientation to time 5 5 5   Orientation to Place 5 5 5   Registration 3 3 3   Attention/ Calculation 5 5 5   Recall 3 2 3   Language- name 2 objects 2 2 2   Language- repeat 1 1 1   Language- follow 3 step command 3 3 3   Language- read & follow direction 1 1 1   Write a sentence 1 1 1   Copy design 1 1 1   Total score 30 29 30         Immunization History  Administered Date(s) Administered  . Influenza, High Dose Seasonal PF 12/26/2016, 01/05/2018  . Influenza,inj,Quad PF,6+ Mos 12/18/2012, 01/06/2014, 12/01/2014, 12/26/2015  . PPD Test 12/16/2012, 11/08/2014, 10/31/2015, 11/05/2016, 10/07/2017  . Pneumococcal Conjugate-13 06/15/2015  . Pneumococcal Polysaccharide-23 07/15/2004  . Td 06/14/2013  . Tdap 06/14/2013  . Zoster 06/08/2010   Screening Tests Health Maintenance  Topic Date Due  . MAMMOGRAM  03/04/2018  . COLONOSCOPY  01/14/2021  . TETANUS/TDAP  06/15/2023  . INFLUENZA VACCINE  Completed  . DEXA SCAN  Completed  . PNA vac Low Risk Adult  Completed      Plan:   End of life planning; Advanced aging; Advanced directives discussed.  No HCPOA/Living Will.  Additional information declined at this time.  I have personally reviewed and noted the following in the patient's chart:   . Medical and social history . Use of alcohol, tobacco or illicit drugs  . Current medications and supplements . Functional ability and status . Nutritional status . Physical activity . Advanced directives . List of other physicians . Hospitalizations, surgeries, and ER visits in previous 12 months . Vitals . Screenings to include cognitive, depression, and falls . Referrals and appointments  In addition, I have reviewed and discussed with patient certain preventive protocols, quality metrics, and best practice recommendations. A written personalized care plan for preventive  services as  well as general preventive health recommendations were provided to patient.     OBrien-Blaney, Ruchi Stoney L, LPN  56/09/139     I have reviewed the above information and agree with above.   Deborra Medina, MD

## 2018-02-04 NOTE — Patient Instructions (Addendum)
  Holly Perez , Thank you for taking time to come for your Medicare Wellness Visit. I appreciate your ongoing commitment to your health goals. Please review the following plan we discussed and let me know if I can assist you in the future.   These are the goals we discussed: Goals      Patient Stated   . Increase physical activity (pt-stated)     Add a senior exercise program to walking routine    . Weigh 125lb (pt-stated)       This is a list of the screening recommended for you and due dates:  Health Maintenance  Topic Date Due  . Mammogram  03/04/2018  . Colon Cancer Screening  01/14/2021  . Tetanus Vaccine  06/15/2023  . Flu Shot  Completed  . DEXA scan (bone density measurement)  Completed  . Pneumonia vaccines  Completed

## 2018-03-05 ENCOUNTER — Ambulatory Visit
Admission: RE | Admit: 2018-03-05 | Discharge: 2018-03-05 | Disposition: A | Discharge status: Home / Self Care | Payer: Medicare Other | Source: Ambulatory Visit | Attending: Internal Medicine | Admitting: Internal Medicine

## 2018-03-05 DIAGNOSIS — Z1231 Encounter for screening mammogram for malignant neoplasm of breast: Secondary | ICD-10-CM | POA: Diagnosis not present

## 2018-03-05 DIAGNOSIS — M810 Age-related osteoporosis without current pathological fracture: Secondary | ICD-10-CM | POA: Diagnosis not present

## 2018-03-05 DIAGNOSIS — Z780 Asymptomatic menopausal state: Secondary | ICD-10-CM | POA: Diagnosis not present

## 2018-03-05 DIAGNOSIS — M8589 Other specified disorders of bone density and structure, multiple sites: Secondary | ICD-10-CM | POA: Diagnosis not present

## 2018-05-01 ENCOUNTER — Telehealth: Payer: Self-pay | Admitting: *Deleted

## 2018-05-01 ENCOUNTER — Other Ambulatory Visit: Payer: Self-pay | Admitting: Internal Medicine

## 2018-05-01 NOTE — Telephone Encounter (Signed)
Copied from [place]. Topic: Appointment Scheduling - Scheduling Inquiry for Clinic >> May 01, 2018  1:28 PM Holly Perez wrote: Reason for CRM: Patient calling to inquire if she can move appointment from 2/5 to 2/6- Only same day slots available, unable to schedule. Please advise.

## 2018-05-01 NOTE — Telephone Encounter (Signed)
Yes you can move her to a same day slot

## 2018-05-04 NOTE — Telephone Encounter (Signed)
Spoke with pt and she stated that she received a message stating that she needed to call back and reschedule her appt. Pt stated that she wasn't sure if it was for this office or not. Pt is okay with the appt that she is scheduled for on Wednesday. Did not reschedule pt's appt because the message was not from our office that I could find in the chart.

## 2018-05-04 NOTE — Telephone Encounter (Signed)
LMTCB. Please transfer pt to our office.  

## 2018-05-06 ENCOUNTER — Ambulatory Visit (INDEPENDENT_AMBULATORY_CARE_PROVIDER_SITE_OTHER): Payer: Medicare Other | Admitting: Internal Medicine

## 2018-05-06 ENCOUNTER — Encounter: Payer: Self-pay | Admitting: Internal Medicine

## 2018-05-06 VITALS — BP 138/70 | HR 65 | Temp 98.0°F | Resp 16 | Ht 62.0 in | Wt 126.8 lb

## 2018-05-06 DIAGNOSIS — M810 Age-related osteoporosis without current pathological fracture: Secondary | ICD-10-CM

## 2018-05-06 DIAGNOSIS — F411 Generalized anxiety disorder: Secondary | ICD-10-CM

## 2018-05-06 DIAGNOSIS — Z23 Encounter for immunization: Secondary | ICD-10-CM

## 2018-05-06 DIAGNOSIS — E039 Hypothyroidism, unspecified: Secondary | ICD-10-CM

## 2018-05-06 DIAGNOSIS — I10 Essential (primary) hypertension: Secondary | ICD-10-CM | POA: Diagnosis not present

## 2018-05-06 LAB — COMPREHENSIVE METABOLIC PANEL
ALT: 23 U/L (ref 0–35)
AST: 31 U/L (ref 0–37)
Albumin: 4.3 g/dL (ref 3.5–5.2)
Alkaline Phosphatase: 54 U/L (ref 39–117)
BUN: 12 mg/dL (ref 6–23)
CHLORIDE: 94 meq/L — AB (ref 96–112)
CO2: 26 meq/L (ref 19–32)
CREATININE: 0.81 mg/dL (ref 0.40–1.20)
Calcium: 9.7 mg/dL (ref 8.4–10.5)
GFR: 82.57 mL/min (ref >60.00)
Glucose, Bld: 84 mg/dL (ref 70–99)
Potassium: 4.9 mEq/L (ref 3.5–5.1)
SODIUM: 129 meq/L — AB (ref 135–145)
Total Bilirubin: 0.5 mg/dL (ref 0.2–1.2)
Total Protein: 8.5 g/dL — ABNORMAL HIGH (ref 6.0–8.3)

## 2018-05-06 LAB — TSH: TSH: 2.44 u[IU]/mL (ref 0.35–4.50)

## 2018-05-06 MED ORDER — ZOSTER VAC RECOMB ADJUVANTED 50 MCG/0.5ML IM SUSR: 0.5000 mL | Freq: Once | INTRAMUSCULAR | 1 refills | Status: AC

## 2018-05-06 NOTE — Patient Instructions (Addendum)
Here are the names of several well respected female therapists .  You can call them directly   Lennon Alstrom    [phone]  [place]   [phone] [place] [phone]  Achilles Dunk  [phone]  [place]  If none of them are available,  Let me know and I will refer you to a therapist  From [facility] group pto help manage your depression and anxiety   You do not need Physical Therapy.  I recommend [place]  To strengthen your core and balance.    Your arm  Was the only part of your body that had osteoporosis,,  So you do not need medication for osteoporosis yet.  We will repeat your DEXA scan in 2 years and if there  has been a change in your spin or hip bone density.    The ShingRx vaccine is now available in local pharmacies and is much more protective than the old one  Zostavax  (it is about 97%  Effective in preventing shingles). .   It is therefore ADVISED for all interested adults over 50 to prevent shingles so I have printed you a prescription for it.  (it requires a 2nd dose 2 too 6 months after the first one) .  It will cause you to have flu  like symptoms for 2 days so wait until after the flu season      Osteoporosis  Osteoporosis is thinning and loss of density in your bones. Osteoporosis makes bones more brittle and fragile and more likely to break (fracture). Over time, osteoporosis can cause your bones to become so weak that they fracture after a minor fall. Bones in the hip, wrist, and spine are most likely to fracture due to osteoporosis. What are the causes? The exact cause of this condition is not known. What increases the risk? You may be at greater risk for osteoporosis if you:  Have a family history of the condition.  Have poor nutrition.  Use steroid medicines, such as prednisone.  Are female.  Are age 7 or older.  Smoke or have a history of smoking.  Are not physically  active (are sedentary).  Are white (Caucasian) or of Asian descent.  Have a small body frame.  Take certain medicines, such as antiseizure medicines. What are the signs or symptoms? A fracture might be the first sign of osteoporosis, especially if the fracture results from a fall or injury that usually would not cause a bone to break. Other signs and symptoms include:  Pain in the neck or low back.  Stooped posture.  Loss of height. How is this diagnosed? This condition may be diagnosed based on:  Your medical history.  A physical exam.  A bone mineral density test, also called a DXA or DEXA test (dual-energy X-ray absorptiometry test). This test uses X-rays to measure the amount of minerals in your bones. How is this treated? The goal of treatment is to strengthen your bones and lower your risk for a fracture. Treatment may involve:  Making lifestyle changes, such as: ? Including foods with more calcium and vitamin D in your diet. ? Doing weight-bearing and muscle-strengthening exercises. ? Stopping tobacco use. ? Limiting alcohol intake.  Taking medicine to slow the process of bone loss or to increase bone density.  Taking daily supplements of calcium and vitamin D.  Taking hormone replacement medicines, such as estrogen for women and testosterone for  men.  Monitoring your levels of calcium and vitamin D. Follow these instructions at home:  Activity  Exercise as told by your health care provider. Ask your health care provider what exercises and activities are safe for you. You should do: ? Exercises that make you work against gravity (weight-bearing exercises), such as tai chi, yoga, or walking. ? Exercises to strengthen muscles, such as lifting weights. Lifestyle  Limit alcohol intake to no more than 1 drink a day for nonpregnant women and 2 drinks a day for men. One drink equals 12 oz of beer, 5 oz of wine, or 1 oz of hard liquor.  Do not use any products that  contain nicotine or tobacco, such as cigarettes and e-cigarettes. If you need help quitting, ask your health care provider. Preventing falls  Use devices to help you move around (mobility aids) as needed, such as canes, walkers, scooters, or crutches.  Keep rooms well-lit and clutter-free.  Remove tripping hazards from walkways, including cords and throw rugs.  Install grab bars in bathrooms and safety rails on stairs.  Use rubber mats in the bathroom and other areas that are often wet or slippery.  Wear closed-toe shoes that fit well and support your feet. Wear shoes that have rubber soles or low heels.  Review your medicines with your health care provider. Some medicines can cause dizziness or changes in blood pressure, which can increase your risk of falling. General instructions  Include calcium and vitamin D in your diet. Calcium is important for bone health, and vitamin D helps your body to absorb calcium. Good sources of calcium and vitamin D include: ? Certain fatty fish, such as salmon and tuna. ? Products that have calcium and vitamin D added to them (fortified products), such as fortified cereals. ? Egg yolks. ? Cheese. ? Liver.  Take over-the-counter and prescription medicines only as told by your health care provider.  Keep all follow-up visits as told by your health care provider. This is important. Contact a health care provider if:  You have never been screened for osteoporosis and you are: ? A woman who is age 37 or older. ? A man who is age 34 or older. Get help right away if:  You fall or injure yourself. Summary  Osteoporosis is thinning and loss of density in your bones. This makes bones more brittle and fragile and more likely to break (fracture),even with minor falls.  The goal of treatment is to strengthen your bones and reduce your risk for a fracture.  Include calcium and vitamin D in your diet. Calcium is important for bone health, and vitamin D  helps your body to absorb calcium.  Talk with your health care provider about screening for osteoporosis if you are a woman who is age 60 or older, or a man who is age 55 or older. This information is not intended to replace advice given to you by your health care provider. Make sure you discuss any questions you have with your health care provider. Document Released: 12/26/2004 Document Revised: 01/10/2017 Document Reviewed: 01/10/2017 Elsevier Interactive Patient Education  2019 [employer].

## 2018-05-06 NOTE — Progress Notes (Signed)
Subjective:  Patient ID: Holly Perez, female    DOB: Mar 09, 1941  Age: 78 y.o. MRN: [id number]  CC: The primary encounter diagnosis was Essential hypertension. Diagnoses of Acquired hypothyroidism, Need for 23-polyvalent pneumococcal polysaccharide vaccine, Osteoporosis, postmenopausal, and Generalized anxiety disorder were also pertinent to this visit.  HPI Holly Perez presents for discussion of  Her recent DEXA scan.     DEXA done Dec 2019:  Forearm T score -2.5 ,femur -1.5  Spine -1.8.  She has no history of fractures.  Additional risks  Include age and low body weight.  However, she is physically active on a daily basis . The risks and benefits of starting therapy at  This point were discussed.  The available treatments were discussed along with the potential side effects of each.   2) anxiety/depression:  She relates that she is often bothered by the fact that she never had children.  She feels this most strongly when she is with friends who talk about their extended families.  She lives alone and has recurrent feelings of anxiety but refuses SSRI therapy and prefers to use alprazolam  At a low dose twice daily.  She is interested in seeing a therapist and  prefers to see a female .   Outpatient Medications Prior to Visit  Medication Sig Dispense Refill  . albuterol (PROVENTIL HFA;VENTOLIN HFA) 108 (90 BASE) MCG/ACT inhaler Inhale into the lungs every 6 (six) hours as needed for wheezing or shortness of breath.    . ALPRAZolam (XANAX) 0.25 MG tablet Take 1 tablet (0.25 mg total) by mouth 2 (two) times daily as needed. 60 tablet 5  . aspirin 81 MG tablet Take 81 mg by mouth daily.    . Calcium Carbonate-Vitamin D (CALTRATE 600+D PO) Take 1,200 mg by mouth daily.     . cholecalciferol (VITAMIN D) 1000 UNITS tablet Take 2,000 Units by mouth daily.    . fish oil-omega-3 fatty acids 1000 MG capsule Take 1 g by mouth daily.     . Fluticasone-Salmeterol (ADVAIR) 250-50 MCG/DOSE AEPB Inhale 1  puff into the lungs every 12 (twelve) hours.    . Multiple Vitamin (MULTIVITAMIN) tablet Take 1 tablet by mouth daily.    . traMADol (ULTRAM) 50 MG tablet Take 1 tablet (50 mg total) by mouth 2 (two) times daily. For moderate pain 60 tablet 5  . vitamin E 100 UNIT capsule Take 100 Units by mouth daily.    Marland Kitchen amLODipine (NORVASC) 10 MG tablet TAKE 1 TABLET(10 MG) BY MOUTH DAILY 90 tablet 1  . levothyroxine (SYNTHROID, LEVOTHROID) 50 MCG tablet TAKE 1 TABLET BY MOUTH EVERY DAY BEFORE BREAKFAST 90 tablet 1  . losartan (COZAAR) 100 MG tablet TAKE 1 TABLET(100 MG) BY MOUTH DAILY 90 tablet 0   No facility-administered medications prior to visit.     Review of Systems;  Patient denies headache, fevers, malaise, unintentional weight loss, skin rash, eye pain, sinus congestion and sinus pain, sore throat, dysphagia,  hemoptysis , cough, dyspnea, wheezing, chest pain, palpitations, orthopnea, edema, abdominal pain, nausea, melena, diarrhea, constipation, flank pain, dysuria, hematuria, urinary  Frequency, nocturia, numbness, tingling, seizures,  Focal weakness, Loss of consciousness,  Tremor, insomnia, depression, anxiety, and suicidal ideation.      Objective:  BP 138/70 (BP Location: Left Arm, Patient Position: Sitting, Cuff Size: Normal)   Pulse 65   Temp 98 F (36.7 C) (Oral)   Resp 16   Ht 5\' 2"  (1.575 m)   Wt 126 lb  12.8 oz (57.5 kg)   SpO2 99%   BMI 23.19 kg/m   BP Readings from Last 3 Encounters:  05/06/18 138/70  02/04/18 132/70  01/14/18 (!) 145/70    Wt Readings from Last 3 Encounters:  05/06/18 126 lb 12.8 oz (57.5 kg)  02/04/18 129 lb 6.4 oz (58.7 kg)  01/14/18 127 lb (57.6 kg)    General appearance: alert, cooperative and appears stated age Ears: normal TM's and external ear canals both ears Throat: lips, mucosa, and tongue normal; teeth and gums normal Neck: no adenopathy, no carotid bruit, supple, symmetrical, trachea midline and thyroid not enlarged, symmetric, no  tenderness/mass/nodules Back: symmetric, no curvature. ROM normal. No CVA tenderness. Lungs: clear to auscultation bilaterally Heart: regular rate and rhythm, S1, S2 normal, no murmur, click, rub or gallop Abdomen: soft, non-tender; bowel sounds normal; no masses,  no organomegaly Pulses: 2+ and symmetric Skin: Skin color, texture, turgor normal. No rashes or lesions Lymph nodes: Cervical, supraclavicular, and axillary nodes normal.  No results found for: HGBA1C  Lab Results  Component Value Date   CREATININE 0.81 05/06/2018   CREATININE 0.78 11/03/2017   CREATININE 0.73 05/16/2017    Lab Results  Component Value Date   WBC 4.9 12/01/2014   HGB 12.1 12/01/2014   HCT 36.5 12/01/2014   PLT 366.0 12/01/2014   GLUCOSE 84 05/06/2018   CHOL 141 07/25/2016   TRIG 68.0 07/25/2016   HDL 58.60 07/25/2016   LDLCALC 69 07/25/2016   ALT 23 05/06/2018   AST 31 05/06/2018   NA 129 (L) 05/06/2018   K 4.9 05/06/2018   CL 94 (L) 05/06/2018   CREATININE 0.81 05/06/2018   BUN 12 05/06/2018   CO2 26 05/06/2018   TSH 2.44 05/06/2018   MICROALBUR 9.9 (H) 05/12/2017    Dg Bone Density  Result Date: 03/05/2018 EXAM: DUAL X-RAY ABSORPTIOMETRY (DXA) FOR BONE MINERAL DENSITY IMPRESSION: Technologist: MTB Your patient Holly Perez completed a BMD test on 03/05/2018 using the [employer] (analysis version: 14.10) manufactured by [employer]. The following summarizes the results of our evaluation. PATIENT BIOGRAPHICAL: Name: Holly Perez Patient ID: [id number] Birth Date: 1940/11/19 Height: 62.0 in. Gender: Female Exam Date: 03/05/2018 Weight: 129.4 lbs. Indications: Advanced Age, Asthma, Osteoporotic, Postmenopausal Fractures: Treatments: Albuterol, caltrate, Flonase, Synthroid, Vitamin D ASSESSMENT: The BMD measured at Forearm Radius 33% is 0.656 g/cm2 with a T-score of -2.5. This patient is considered osteoporotic according to [facility]) criteria. The quality of the  scan is good. Site Region Measured Measured WHO Young Adult BMD Date       Age      Classification T-score AP Spine L1-L4 2/05/2018 78.6 Osteopenia -1.8 0.975 g/cm2 DualFemur Neck Left 03/05/2018 78.6 Osteopenia -1.5 0.831 g/cm2 DualFemur Total Mean 03/05/2018 78.6 Normal -0.9 0.899 g/cm2 Left Forearm Radius 33% 03/05/2018 78.6 Osteoporosis -2.5 0.656 g/cm2 World Health Organization [facility]) criteria for post-menopausal, Caucasian Women: Normal:       T-score at or above -1 SD Osteopenia:   T-score between -1 and -2.5 SD Osteoporosis: T-score at or below -2.5 SD RECOMMENDATIONS: 1. All patients should optimize calcium and vitamin D intake. 2. Consider FDA-approved medical therapies in postmenopausal women and men aged 63 years and older, based on the following: a. A hip or vertebral(clinical or morphometric) fracture b. T-score < -2.5 at the femoral neck or spine after appropriate evaluation to exclude secondary causes c. Low bone mass (T-score between -1.0 and -2.5 at the femoral neck or spine)  and a 10-year probability of a hip fracture > 3% or a 10-year probability of a major osteoporosis-related fracture > 20% based on the [place]-adapted WHO algorithm d. Clinician judgment and/or patient preferences may indicate treatment for people with 10-year fracture probabilities above or below these levels FOLLOW-UP: Patients with diagnosis of osteoporosis or at high risk for fracture should have regular bone mineral density tests. For patients eligible for Medicare, routine testing is allowed once every 2 years. The testing frequency can be increased to one year for patients who have rapidly progressing disease, those who are receiving or discontinuing medical therapy to restore bone mass, or have additional risk factors. I have reviewed this report, and agree with the above findings. [facility] Radiology Electronically Signed   By: Ilona Sorrel M.D.   On: 03/05/2018 13:08   Mm 3d Screen Breast Bilateral  Result Date:  03/05/2018 CLINICAL DATA:  Screening. EXAM: DIGITAL SCREENING BILATERAL MAMMOGRAM WITH TOMO AND CAD COMPARISON:  Previous exam(s). ACR Breast Density Category b: There are scattered areas of fibroglandular density. FINDINGS: There are no findings suspicious for malignancy. Images were processed with CAD. IMPRESSION: No mammographic evidence of malignancy. A result letter of this screening mammogram will be mailed directly to the patient. RECOMMENDATION: Screening mammogram in one year. (Code:[id number]-B-01Y) BI-RADS CATEGORY  1: Negative. Electronically Signed   By: Ammie Ferrier M.D.   On: 03/05/2018 11:59    Assessment & Plan:   Problem List Items Addressed This Visit    Osteoporosis, postmenopausal    She has had prior treatment with alendronate,  From 2013 to 2016 before stopping the medication.  She prefers to defer medication at this time.  Calcium Vit D and weight bearing exercise recommended.  Will repeat in 3 years      Generalized anxiety disorder    She has documented intolerance to buspirone, Paxil,  And lexapro.  She uses 0.125 mg alprazolam most days in the morning  And a full 0.25 mg at bedtime .  The risks and benefits of benzodiazepine use were reviewed with patient today including cognitive decline. Referral for psychologist discussed,  She was given names of local female therapists to contact       Essential hypertension - Primary    Elevated secondray to white coat effect. home readings are consistently < 130/80       Relevant Orders   Comprehensive metabolic panel (Completed)   Acquired hypothyroidism   Relevant Orders   TSH (Completed)    Other Visit Diagnoses    Need for 23-polyvalent pneumococcal polysaccharide vaccine       Relevant Orders   Pneumococcal polysaccharide vaccine 23-valent greater than or equal to 2yo subcutaneous/IM (Completed)    A total of 40 minutes of face to face time was spent with patient more than half of which was spent in counselling and  coordination of care    I am having Earnest Bailey start on Zoster Vaccine Adjuvanted. I am also having her maintain her aspirin, Fluticasone-Salmeterol, cholecalciferol, Calcium Carbonate-Vitamin D (CALTRATE 600+D PO), multivitamin, vitamin E, fish oil-omega-3 fatty acids, albuterol, ALPRAZolam, and traMADol.  Meds ordered this encounter  Medications  . Zoster Vaccine Adjuvanted [facility]) injection    Sig: Inject 0.5 mLs into the muscle once for 1 dose.    Dispense:  1 each    Refill:  1    There are no discontinued medications.  Follow-up: No follow-ups on file.   Crecencio Mc, MD

## 2018-05-07 ENCOUNTER — Other Ambulatory Visit: Payer: Self-pay | Admitting: Internal Medicine

## 2018-05-09 ENCOUNTER — Encounter: Payer: Self-pay | Admitting: Internal Medicine

## 2018-05-09 NOTE — Assessment & Plan Note (Signed)
Elevated secondray to white coat effect. home readings are consistently < 130/80

## 2018-05-09 NOTE — Assessment & Plan Note (Addendum)
She has documented intolerance to buspirone, Paxil,  And lexapro.  She uses 0.125 mg alprazolam most days in the morning  And a full 0.25 mg at bedtime .  The risks and benefits of benzodiazepine use were reviewed with patient today including cognitive decline. Referral for psychologist discussed,  She was given names of local female therapists to contact

## 2018-05-09 NOTE — Assessment & Plan Note (Signed)
She has had prior treatment with alendronate,  From 2013 to 2016 before stopping the medication.  She prefers to defer medication at this time.  Calcium Vit D and weight bearing exercise recommended.  Will repeat in 3 years

## 2018-05-19 ENCOUNTER — Other Ambulatory Visit: Payer: Self-pay

## 2018-05-19 NOTE — Telephone Encounter (Signed)
LMTCB. Need to let the pt know that Dr. Derrel Nip is out of the office until next Monday and see if she has enough medication to get her through till then or not. PEC may speak with pt.

## 2018-05-20 NOTE — Telephone Encounter (Signed)
Pt states that she has 8 left.

## 2018-05-25 ENCOUNTER — Other Ambulatory Visit: Payer: Self-pay | Admitting: Internal Medicine

## 2018-05-25 MED ORDER — ALPRAZOLAM 0.25 MG PO TABS: 0.2500 mg | ORAL_TABLET | Freq: Two times a day (BID) | ORAL | 5 refills | Status: DC | PRN

## 2018-05-25 NOTE — Telephone Encounter (Signed)
Refilled: 11/03/2017 Last OV: 05/06/2018 Next OV: 02/12/2019

## 2018-05-27 ENCOUNTER — Other Ambulatory Visit: Payer: Self-pay

## 2018-05-27 MED ORDER — LOSARTAN POTASSIUM 100 MG PO TABS: ORAL_TABLET | ORAL | 1 refills | Status: DC

## 2018-06-03 ENCOUNTER — Other Ambulatory Visit: Payer: Self-pay | Admitting: Internal Medicine

## 2018-06-03 NOTE — Telephone Encounter (Signed)
Copied from [place] [id number]. Topic: Quick Communication - Rx Refill/Question >> Jun 03, 2018  1:50 PM Ahmed Prima L wrote: Medication: traMADol (ULTRAM) 50 MG tablet  Has the patient contacted their pharmacy? yes (Agent: If no, request that the patient contact the pharmacy for the refill.) (Agent: If yes, when and what did the pharmacy advise?)  Preferred Pharmacy (with phone number or street name): [id number] [address], [place] [address] [place] [address] Phone: [phone] Fax: [phone]    Agent: Please be advised that RX refills may take up to 3 business days. We ask that you follow-up with your pharmacy.

## 2018-06-03 NOTE — Telephone Encounter (Signed)
Requested medication (s) are due for refill today: yes  Requested medication (s) are on the active medication list: yes  Last refill:  11/03/17 #[id number] [phone] RF  Future visit scheduled: yes   Notes to clinic:  Medication not delegated to NT to refill. Due for UDS.   Requested Prescriptions  Pending Prescriptions Disp Refills   traMADol (ULTRAM) 50 MG tablet 60 tablet 5    Sig: Take 1 tablet (50 mg total) by mouth 2 (two) times daily. For moderate pain     Not Delegated - Analgesics:  Opioid Agonists Failed - 06/03/2018  1:53 PM      Failed - This refill cannot be delegated      Failed - Urine Drug Screen completed in last 360 days.      Passed - Valid encounter within last 6 months    Recent Outpatient Visits          4 weeks ago Essential hypertension   [place], [place]   7 months ago Acquired hypothyroidism   [facility] [place] Crecencio Mc, MD   1 year ago Essential hypertension   [facility]  Crecencio Mc, MD   1 year ago Hyponatremia   [place] Crecencio Mc, MD   1 year ago Cough   [facility] [place], Yvetta Coder, FNP      Future Appointments            In 8 months O'Brien-Blaney, Bryson Corona, LPN [place], [place]   In 8 months Derrel Nip, Aris Everts, MD [facility], [place]

## 2018-06-05 ENCOUNTER — Telehealth: Payer: Self-pay | Admitting: Internal Medicine

## 2018-06-05 ENCOUNTER — Other Ambulatory Visit: Payer: Self-pay | Admitting: Internal Medicine

## 2018-06-05 MED ORDER — TRAMADOL HCL 50 MG PO TABS: 50.0000 mg | ORAL_TABLET | Freq: Two times a day (BID) | ORAL | 5 refills | Status: DC

## 2018-06-05 NOTE — Telephone Encounter (Signed)
Tramadol refilled.

## 2018-06-05 NOTE — Telephone Encounter (Signed)
Refilled: 11/03/2017 Last OV:  05/06/2018 Next OV: 02/12/2019

## 2018-06-05 NOTE — Telephone Encounter (Signed)
Pt was in office wanting this medication filled. Please call patient.

## 2018-11-05 ENCOUNTER — Other Ambulatory Visit: Payer: Self-pay

## 2018-11-05 MED ORDER — LEVOTHYROXINE SODIUM 50 MCG PO TABS: 50.0000 ug | ORAL_TABLET | Freq: Every day | ORAL | 0 refills | Status: DC

## 2018-11-05 MED ORDER — AMLODIPINE BESYLATE 10 MG PO TABS: ORAL_TABLET | ORAL | 1 refills | Status: DC

## 2018-11-18 DIAGNOSIS — H2511 Age-related nuclear cataract, right eye: Secondary | ICD-10-CM | POA: Diagnosis not present

## 2018-12-17 ENCOUNTER — Other Ambulatory Visit: Payer: Self-pay

## 2018-12-17 MED ORDER — TRAMADOL HCL 50 MG PO TABS: 50.0000 mg | ORAL_TABLET | Freq: Two times a day (BID) | ORAL | 1 refills | Status: DC

## 2018-12-17 NOTE — Telephone Encounter (Signed)
Refilled: 06/05/2018 Last OV: 05/06/2018 Next OV: 02/12/2019

## 2019-02-02 ENCOUNTER — Other Ambulatory Visit: Payer: Self-pay

## 2019-02-02 MED ORDER — LOSARTAN POTASSIUM 100 MG PO TABS: ORAL_TABLET | ORAL | 1 refills | Status: DC

## 2019-02-12 ENCOUNTER — Other Ambulatory Visit: Payer: Self-pay

## 2019-02-12 ENCOUNTER — Telehealth: Payer: Self-pay

## 2019-02-12 ENCOUNTER — Ambulatory Visit (INDEPENDENT_AMBULATORY_CARE_PROVIDER_SITE_OTHER): Payer: Medicare Other

## 2019-02-12 ENCOUNTER — Ambulatory Visit (INDEPENDENT_AMBULATORY_CARE_PROVIDER_SITE_OTHER): Payer: Medicare Other | Admitting: Internal Medicine

## 2019-02-12 DIAGNOSIS — Z0000 Encounter for general adult medical examination without abnormal findings: Secondary | ICD-10-CM

## 2019-02-12 DIAGNOSIS — E039 Hypothyroidism, unspecified: Secondary | ICD-10-CM

## 2019-02-12 DIAGNOSIS — M810 Age-related osteoporosis without current pathological fracture: Secondary | ICD-10-CM | POA: Diagnosis not present

## 2019-02-12 DIAGNOSIS — F411 Generalized anxiety disorder: Secondary | ICD-10-CM | POA: Diagnosis not present

## 2019-02-12 DIAGNOSIS — I10 Essential (primary) hypertension: Secondary | ICD-10-CM

## 2019-02-12 DIAGNOSIS — Z1231 Encounter for screening mammogram for malignant neoplasm of breast: Secondary | ICD-10-CM | POA: Diagnosis not present

## 2019-02-12 DIAGNOSIS — R809 Proteinuria, unspecified: Secondary | ICD-10-CM | POA: Diagnosis not present

## 2019-02-12 NOTE — Assessment & Plan Note (Signed)
She is due in December

## 2019-02-12 NOTE — Progress Notes (Signed)
Subjective:   Holly Perez is a 78 y.o. female who presents for Medicare Annual (Subsequent) preventive examination.  Review of Systems:  No ROS.  Medicare Wellness Virtual Visit.  Visual/audio telehealth visit, UTA vital signs.   See social history for additional risk factors.   Cardiac Risk Factors include: advanced age (>5men, >54 women);hypertension     Objective:     Vitals: There were no vitals taken for this visit.  There is no height or weight on file to calculate BMI.  Advanced Directives 02/12/2019 01/14/2018 12/26/2016 12/26/2015  Does Patient Have a Medical Advance Directive? No Yes No No  Type of Advance Directive - Healthcare Power of [place];Living will - -  Copy of [place] in Chart? - No - copy requested - -  Would patient like information on creating a medical advance directive? No - Patient declined - No - Patient declined No - patient declined information    Tobacco Social History   Tobacco Use  Smoking Status Never Smoker  Smokeless Tobacco Never Used     Counseling given: Not Answered   Clinical Intake:  Pre-visit preparation completed: Yes        Diabetes: No  How often do you need to have someone help you when you read instructions, pamphlets, or other written materials from your doctor or pharmacy?: 1 - Never  Interpreter Needed?: No     Past Medical History:  Diagnosis Date  . Asthma   . Colon polyp 03/07/2010   2 mm tubular adenoma the ascending colon. Dr. Dionne Milo.   . Hemorrhoids   . Hypertension   . Irritable bowel syndrome (IBS)    Past Surgical History:  Procedure Laterality Date  . ABDOMINAL HYSTERECTOMY    . COLONOSCOPY  2011   Dr. Ernst Breach: 48mm tubular adenoma of the ascending colon without dysplasia or malignancy.  . COLONOSCOPY WITH PROPOFOL N/A 01/14/2018   Procedure: COLONOSCOPY WITH PROPOFOL;  Surgeon: Robert Bellow, MD;  Location: [facility] ENDOSCOPY;  Service: Endoscopy;  Laterality:  N/A;  . ECTOPIC PREGNANCY SURGERY     at age 50  . OVARY SURGERY  1974   tumor   Family History  Problem Relation Age of Onset  . Early death Mother   . COPD Father   . Early death Brother   . Breast cancer Neg Hx    Social History   Socioeconomic History  . Marital status: Divorced    Spouse name: Not on file  . Number of children: Not on file  . Years of education: Not on file  . Highest education level: Not on file  Occupational History  . Not on file  Social Needs  . Financial resource strain: Not hard at all  . Food insecurity    Worry: Never true    Inability: Never true  . Transportation needs    Medical: No    Non-medical: No  Tobacco Use  . Smoking status: Never Smoker  . Smokeless tobacco: Never Used  Substance and Sexual Activity  . Alcohol use: No  . Drug use: No  . Sexual activity: Never  Lifestyle  . Physical activity    Days per week: 0 days    Minutes per session: Not on file  . Stress: Not at all  Relationships  . Social Herbalist on phone: Not on file    Gets together: Not on file    Attends religious service: Not on file  Active member of club or organization: Not on file    Attends meetings of clubs or organizations: Not on file    Relationship status: Not on file  Other Topics Concern  . Not on file  Social History Narrative  . Not on file    Outpatient Encounter Medications as of 02/12/2019  Medication Sig  . albuterol (PROVENTIL HFA;VENTOLIN HFA) 108 (90 BASE) MCG/ACT inhaler Inhale into the lungs every 6 (six) hours as needed for wheezing or shortness of breath.  . ALPRAZolam (XANAX) 0.25 MG tablet Take 1 tablet (0.25 mg total) by mouth 2 (two) times daily as needed.  Marland Kitchen amLODipine (NORVASC) 10 MG tablet TAKE 1 TABLET(10 MG) BY MOUTH DAILY  . aspirin 81 MG tablet Take 81 mg by mouth daily.  . Calcium Carbonate-Vitamin D (CALTRATE 600+D PO) Take 1,200 mg by mouth daily.   . cholecalciferol (VITAMIN D) 1000 UNITS tablet  Take 2,000 Units by mouth daily.  . fish oil-omega-3 fatty acids 1000 MG capsule Take 1 g by mouth daily.   . Fluticasone-Salmeterol (ADVAIR) 250-50 MCG/DOSE AEPB Inhale 1 puff into the lungs every 12 (twelve) hours.  Marland Kitchen levothyroxine (SYNTHROID) 50 MCG tablet Take 1 tablet (50 mcg total) by mouth daily before breakfast.  . losartan (COZAAR) 100 MG tablet TAKE 1 TABLET(100 MG) BY MOUTH DAILY  . Multiple Vitamin (MULTIVITAMIN) tablet Take 1 tablet by mouth daily.  . traMADol (ULTRAM) 50 MG tablet Take 1 tablet (50 mg total) by mouth 2 (two) times daily. For moderate pain  . vitamin E 100 UNIT capsule Take 100 Units by mouth daily.   No facility-administered encounter medications on file as of 02/12/2019.     Activities of Daily Living In your present state of health, do you have any difficulty performing the following activities: 02/12/2019  Hearing? N  Vision? N  Difficulty concentrating or making decisions? N  Walking or climbing stairs? N  Dressing or bathing? N  Doing errands, shopping? N  Preparing Food and eating ? N  Using the Toilet? N  In the past six months, have you accidently leaked urine? N  Do you have problems with loss of bowel control? N  Managing your Medications? N  Managing your Finances? N  Housekeeping or managing your Housekeeping? N  Some recent data might be hidden    Patient Care Team: Crecencio Mc, MD as PCP - General (Internal Medicine) Crecencio Mc, MD (Internal Medicine) Bary Castilla Forest Gleason, MD (General Surgery)    Assessment:   This is a routine wellness examination for [place].  Nurse connected with patient 02/12/19 at 10:00 AM EST by a telephone enabled telemedicine application and verified that I am speaking with the correct person using two identifiers. Patient stated full name and DOB. Patient gave permission to continue with virtual visit. Patient's location was at home and Nurse's location was at [place] office.   Health Maintenance  Due: -Influenza vaccine 2020- discussed; to be completed in season with doctor or local pharmacy.   Update all pending maintenance due as appropriate.   See completed HM at the end of note.   Eye: Visual acuity not assessed. Virtual visit. Wears corrective lenses. Followed by their ophthalmologist every 12 months.   Dental: UTD  Hearing: Demonstrates normal hearing during visit.  Safety:  Patient feels safe at home- yes Patient does have smoke detectors at home- yes Patient does wear sunscreen or protective clothing when in direct sunlight - yes Patient does wear seat belt when  in a moving vehicle - yes Patient drives- yes Adequate lighting in walkways free from debris- yes Grab bars and handrails used as appropriate- yes Ambulates with no assistive device Cell phone or lifeline/life alert/medic alert on person when ambulating outside of the home- yes Social: Alcohol intake - no  Smoking history- never   Smokers in home? none Illicit drug use? none  Depression: PHQ 2 &9 complete. See screening below. Denies irritability, anhedonia, sadness/tearfullness.    Falls: See screening below.    Medication: Taking as directed and without issues.   Covid-19: Precautions and sickness symptoms discussed. Wears mask, social distancing, hand hygiene as appropriate.   Activities of Daily Living Patient denies needing assistance with: household chores, feeding themselves, getting from bed to chair, getting to the toilet, bathing/showering, dressing, managing money, or preparing meals.   Memory: Patient is alert. Patient denies difficulty focusing or concentrating. Correctly identified the president of the [place], season and recall. Patient likes to read and complete puzzles for brain stimulation.   BMI- discussed the importance of a healthy diet, water intake and the benefits of aerobic exercise.  Educational material provided.  Physical activity- walks daily 30 minutes  Diet:   Regular Water: good intake  Other Providers Patient Care Team: Crecencio Mc, MD as PCP - General (Internal Medicine) Crecencio Mc, MD (Internal Medicine) Bary Castilla Forest Gleason, MD (General Surgery) Exercise Activities and Dietary recommendations Current Exercise Habits: Home exercise routine, Type of exercise: walking, Time (Minutes): 30, Frequency (Times/Week): 7, Weekly Exercise (Minutes/Week): 210, Intensity: Mild  Goals      Patient Stated   . Increase physical activity (pt-stated)     Keep walking routine    . Weigh 125lb (pt-stated)       Fall Risk Fall Risk  02/12/2019 02/04/2018 12/26/2016 07/25/2016 12/26/2015  Falls in the past year? 0 0 No No No  Number falls in past yr: 0 - - - -  Injury with Fall? - - - - -  Follow up Falls prevention discussed - - - -   Timed Get Up and Go performed: no, virtual visit  Depression Screen PHQ 2/9 Scores 02/12/2019 02/04/2018 12/26/2016 07/25/2016  PHQ - 2 Score 0 0 0 0     Cognitive Function MMSE - Mini Mental State Exam 02/04/2018 12/26/2016 12/26/2015  Orientation to time 5 5 5   Orientation to Place 5 5 5   Registration 3 3 3   Attention/ Calculation 5 5 5   Recall 3 2 3   Language- name 2 objects 2 2 2   Language- repeat 1 1 1   Language- follow 3 step command 3 3 3   Language- read & follow direction 1 1 1   Write a sentence 1 1 1   Copy design 1 1 1   Total score 30 29 30      6CIT Screen 02/12/2019  What Year? 0 points  What month? 0 points  What time? 0 points  Count back from 20 0 points  Months in reverse 0 points  Repeat phrase 0 points  Total Score 0    Immunization History  Administered Date(s) Administered  . Influenza, High Dose Seasonal PF 12/26/2016, 01/05/2018  . Influenza,inj,Quad PF,6+ Mos 12/18/2012, 01/06/2014, 12/01/2014, 12/26/2015  . PPD Test 12/16/2012, 11/08/2014, 10/31/2015, 11/05/2016, 10/07/2017  . Pneumococcal Conjugate-13 06/15/2015  . Pneumococcal Polysaccharide-23 07/15/2004, 05/06/2018   . Td 06/14/2013  . Tdap 06/14/2013  . Zoster 06/08/2010   Screening Tests Health Maintenance  Topic Date Due  . INFLUENZA VACCINE  10/31/2018  .  MAMMOGRAM  03/06/2019  . COLONOSCOPY  01/14/2021  . TETANUS/TDAP  06/15/2023  . DEXA SCAN  Completed  . PNA vac Low Risk Adult  Completed      Plan:    Keep all routine maintenance appointments.   Follow up with your doctor today at [place] I have personally reviewed: The patient's medical and social history Their use of alcohol, tobacco or illicit drugs Their current medications and supplements The patient's functional ability including ADLs,fall risks, home safety risks, cognitive, and hearing and visual impairment Diet and physical activities Evidence for depression   In addition, I have reviewed and discussed with patient certain preventive protocols, quality metrics, and best practice recommendations. A written personalized care plan for preventive services as well as general preventive health recommendations were provided to patient via mail.     Varney Biles, LPN  [id number]

## 2019-02-12 NOTE — Telephone Encounter (Signed)
Called patient and lm on vm to call office to set up a Nurse visit for a BP check and a lab appointment, fasting.

## 2019-02-12 NOTE — Patient Instructions (Addendum)
  Holly Perez , Thank you for taking time to come for your Medicare Wellness Visit. I appreciate your ongoing commitment to your health goals. Please review the following plan we discussed and let me know if I can assist you in the future.   These are the goals we discussed: Goals      Patient Stated   . Increase physical activity (pt-stated)     Keep walking routine    . Weigh 125lb (pt-stated)       This is a list of the screening recommended for you and due dates:  Health Maintenance  Topic Date Due  . Flu Shot  10/31/2018  . Mammogram  03/06/2019  . Colon Cancer Screening  01/14/2021  . Tetanus Vaccine  06/15/2023  . DEXA scan (bone density measurement)  Completed  . Pneumonia vaccines  Completed

## 2019-02-12 NOTE — Telephone Encounter (Signed)
Please scheduled patient for a nurse visit for bp check and nurse visit and a fasting lab appt.

## 2019-02-12 NOTE — Assessment & Plan Note (Signed)
[facility] DEXA ,  Forearm only by 2020 [facility] DEXA.  She continues to defer  theapy unless t scores chang significantly.

## 2019-02-12 NOTE — Progress Notes (Signed)
Telephone  Note  This visit type was conducted due to national recommendations for restrictions regarding the COVID-19 pandemic (e.g. social distancing).  This format is felt to be most appropriate for this patient at this time.  All issues noted in this document were discussed and addressed.  No physical exam was performed (except for noted visual exam findings with Video Visits).   I connected with@ on 02/12/19 at 10:30 AM EST by telephone and verified that I am speaking with the correct person using two identifiers. Location patient: home Location provider: work or home office Persons participating in the virtual visit: patient, provider  I discussed the limitations, risks, security and privacy concerns of performing an evaluation and management service by telephone and the availability of in person appointments. I also discussed with the patient that there may be a patient responsible charge related to this service. The patient expressed understanding and agreed to proceed.   Reason for visit: follow up   HPI:   .The patient has no signs or symptoms of COVID 19 infection (fever, cough, sore throat  or shortness of breath beyond what is typical for patient).  Patient denies contact with other persons with the above mentioned symptoms or with anyone confirmed to have COVID 19  She reports increased anxiety due to the pandemic both due to fear of illness and increased isolation.  Using alprazolam for insonnia and prn daytime.   Patient is taking her medications as prescribed and notes no adverse effects.  Home BP readings have not  been done  .  She is avoiding added salt in her diet and walking regularly about 5 times per week for exercise  .   ROS: See pertinent positives and negatives per HPI.  Past Medical History:  Diagnosis Date  . Asthma   . Colon polyp 03/07/2010   2 mm tubular adenoma the ascending colon. Dr. Dionne Milo.   . Hemorrhoids   . Hypertension   . Irritable bowel  syndrome (IBS)     Past Surgical History:  Procedure Laterality Date  . ABDOMINAL HYSTERECTOMY    . COLONOSCOPY  2011   Dr. Ernst Breach: 51mm tubular adenoma of the ascending colon without dysplasia or malignancy.  . COLONOSCOPY WITH PROPOFOL N/A 01/14/2018   Procedure: COLONOSCOPY WITH PROPOFOL;  Surgeon: Robert Bellow, MD;  Location: [facility] END[facility]COPY;  Service: Endoscopy;  Laterality: N/A;  . ECTOPIC PREGNANCY SURGERY     at age 89  . OVARY SURGERY  1974   tumor    Family History  Problem Relation Age of Onset  . Early death Mother   . COPD Father   . Early death Brother   . Breast cancer Neg Hx     SOCIAL HX:  reports that she has never smoked. She has never used smokeless tobacco. She reports that she does not drink alcohol or use drugs.   Current Outpatient Medications:  .  albuterol (PROVENTIL HFA;VENTOLIN HFA) 108 (90 BASE) MCG/ACT inhaler, Inhale into the lungs every 6 (six) hours as needed for wheezing or shortness of breath., Disp: , Rfl:  .  ALPRAZolam (XANAX) 0.25 MG tablet, Take 1 tablet (0.25 mg total) by mouth 2 (two) times daily as needed., Disp: 60 tablet, Rfl: 5 .  amLODipine (NORVASC) 10 MG tablet, TAKE 1 TABLET(10 MG) BY MOUTH DAILY, Disp: 90 tablet, Rfl: 1 .  aspirin 81 MG tablet, Take 81 mg by mouth daily., Disp: , Rfl:  .  Calcium Carbonate-Vitamin D (CALTRATE 600+D PO), Take 1,200  mg by mouth daily. , Disp: , Rfl:  .  cholecalciferol (VITAMIN D) 1000 UNITS tablet, Take 2,000 Units by mouth daily., Disp: , Rfl:  .  fish oil-omega-3 fatty acids 1000 MG capsule, Take 1 g by mouth daily. , Disp: , Rfl:  .  Fluticasone-Salmeterol (ADVAIR) 250-50 MCG/DOSE AEPB, Inhale 1 puff into the lungs every 12 (twelve) hours., Disp: , Rfl:  .  levothyroxine (SYNTHROID) 50 MCG tablet, Take 1 tablet (50 mcg total) by mouth daily before breakfast., Disp: 90 tablet, Rfl: 0 .  losartan (COZAAR) 100 MG tablet, TAKE 1 TABLET(100 MG) BY MOUTH DAILY, Disp: 90 tablet, Rfl: 1 .   Multiple Vitamin (MULTIVITAMIN) tablet, Take 1 tablet by mouth daily., Disp: , Rfl:  .  traMADol (ULTRAM) 50 MG tablet, Take 1 tablet (50 mg total) by mouth 2 (two) times daily. For moderate pain, Disp: 60 tablet, Rfl: 1 .  vitamin E 100 UNIT capsule, Take 100 Units by mouth daily., Disp: , Rfl:   EXAM:  General impression: alert, cooperative and articulate.  No signs of being in distress  Lungs: speech is fluent sentence length suggests that patient is not short of breath and not punctuated by cough, sneezing or sniffing. Marland Kitchen   Psych: affect normal.  speech is articulate and non pressured .  Denies suicidal thoughts   ASSESSMENT AND PLAN:  Discussed the following assessment and plan:  Acquired hypothyroidism - Plan: TSH  Essential hypertension - Plan: Comprehensive metabolic panel, Lipid panel  Microalbuminuria - Plan: Urine Microalbumin w/creat. ratio  Osteoporosis, postmenopausal  Generalized anxiety disorder  Breast cancer screening by mammogram  Osteoporosis, postmenopausal [facility] DEXA ,  Forearm only by 2020 [facility] DEXA.  She continues to defer  theapy unless t scores chang significantly.  Essential hypertension Currently taking amlodipine and losartan at maximal doses and not checking BP at home.  RN visit needed   Generalized anxiety disorder Aggravated somewhat by COVID pandemic.  Continues to use alprazolam  At night and prn daily  .  The risks and benefits of benzodiazepine use were reviewed with patient today including excessive sedation leading to respiratory depression,  impaired thinking/driving, and addiction.  Patient was advised to avoid concurrent use with alcohol, to use medication only as needed and not to share with others  .   Breast cancer screening by mammogram She is due in December    I discussed the assessment and treatment plan with the patient. The patient was provided an opportunity to ask questions and all were answered. The patient agreed with the  plan and demonstrated an understanding of the instructions.   The patient was advised to call back or seek an in-person evaluation if the symptoms worsen or if the condition fails to improve as anticipated.  I provided  25 minutes of non-face-to-face time during this encounter reviewing patient's current problems and post surgeries.  Providing counseling on the above mentioned problems , and coordination  of care . Crecencio Mc, MD

## 2019-02-12 NOTE — Assessment & Plan Note (Signed)
Currently taking amlodipine and losartan at maximal doses and not checking BP at home.  RN visit needed

## 2019-02-12 NOTE — Assessment & Plan Note (Signed)
Aggravated somewhat by COVID pandemic.  Continues to use alprazolam  At night and prn daily  .  The risks and benefits of benzodiazepine use were reviewed with patient today including excessive sedation leading to respiratory depression,  impaired thinking/driving, and addiction.  Patient was advised to avoid concurrent use with alcohol, to use medication only as needed and not to share with others  .  

## 2019-02-15 ENCOUNTER — Other Ambulatory Visit: Payer: Self-pay

## 2019-02-17 ENCOUNTER — Other Ambulatory Visit (INDEPENDENT_AMBULATORY_CARE_PROVIDER_SITE_OTHER): Payer: Medicare Other

## 2019-02-17 ENCOUNTER — Other Ambulatory Visit: Payer: Self-pay

## 2019-02-17 ENCOUNTER — Ambulatory Visit (INDEPENDENT_AMBULATORY_CARE_PROVIDER_SITE_OTHER): Payer: Medicare Other

## 2019-02-17 ENCOUNTER — Other Ambulatory Visit: Payer: Self-pay | Admitting: Internal Medicine

## 2019-02-17 DIAGNOSIS — I10 Essential (primary) hypertension: Secondary | ICD-10-CM

## 2019-02-17 DIAGNOSIS — R809 Proteinuria, unspecified: Secondary | ICD-10-CM

## 2019-02-17 DIAGNOSIS — Z23 Encounter for immunization: Secondary | ICD-10-CM

## 2019-02-17 DIAGNOSIS — E039 Hypothyroidism, unspecified: Secondary | ICD-10-CM | POA: Diagnosis not present

## 2019-02-17 LAB — COMPREHENSIVE METABOLIC PANEL
ALT: 19 U/L (ref 0–35)
AST: 29 U/L (ref 0–37)
Albumin: 4 g/dL (ref 3.5–5.2)
Alkaline Phosphatase: 55 U/L (ref 39–117)
BUN: 9 mg/dL (ref 6–23)
CO2: 27 mEq/L (ref 19–32)
Calcium: 9.6 mg/dL (ref 8.4–10.5)
Chloride: 97 mEq/L (ref 96–112)
Creatinine, Ser: 0.78 mg/dL (ref 0.40–1.20)
GFR: 86.07 mL/min (ref >60.00)
Glucose, Bld: 83 mg/dL (ref 70–99)
Potassium: 4 mEq/L (ref 3.5–5.1)
Sodium: 131 mEq/L — ABNORMAL LOW (ref 135–145)
Total Bilirubin: 0.5 mg/dL (ref 0.2–1.2)
Total Protein: 7.6 g/dL (ref 6.0–8.3)

## 2019-02-17 LAB — MICROALBUMIN / CREATININE URINE RATIO
Creatinine,U: 35.1 mg/dL
Microalb Creat Ratio: 4.7 mg/g (ref 0.0–30.0)
Microalb, Ur: 1.6 mg/dL (ref 0.0–1.9)

## 2019-02-17 LAB — LIPID PANEL
Cholesterol: 149 mg/dL (ref 0–200)
HDL: 52.3 mg/dL (ref >39.00)
LDL Cholesterol: 82 mg/dL (ref 0–99)
NonHDL: 96.87
Total CHOL/HDL Ratio: 3
Triglycerides: 75 mg/dL (ref 0.0–149.0)
VLDL: 15 mg/dL (ref 0.0–40.0)

## 2019-02-17 LAB — TSH: TSH: 3.19 u[IU]/mL (ref 0.35–4.50)

## 2019-02-17 MED ORDER — ALPRAZOLAM 0.25 MG PO TABS: 0.2500 mg | ORAL_TABLET | Freq: Two times a day (BID) | ORAL | 5 refills | Status: DC | PRN

## 2019-02-17 NOTE — Addendum Note (Signed)
Addended by: Leeanne Rio on: 02/17/2019 01:35 PM   Modules accepted: Orders

## 2019-02-17 NOTE — Telephone Encounter (Signed)
Pt would like a refill on medication ALPRAZolam (XANAX) 0.25 MG tablet.  Pharmacy is [id number] - [address], [place]   Please and Thank you!

## 2019-02-17 NOTE — Telephone Encounter (Signed)
Refilled: 05/25/2018 Last OV: 02/12/2019 Next OV: not scheduled

## 2019-02-17 NOTE — Addendum Note (Signed)
Addended by: Zannie Cove on: 02/17/2019 02:13 PM   Modules accepted: Orders

## 2019-02-18 ENCOUNTER — Other Ambulatory Visit: Payer: Self-pay | Admitting: Internal Medicine

## 2019-02-18 MED ORDER — TRAMADOL HCL 50 MG PO TABS: 50.0000 mg | ORAL_TABLET | Freq: Two times a day (BID) | ORAL | 5 refills | Status: DC

## 2019-02-18 NOTE — Telephone Encounter (Signed)
Alprazolam was refilled today.   Tramadol  Refilled: 12/17/2018 Last OV: 02/12/2019 Next OV: not scheduled

## 2019-02-18 NOTE — Telephone Encounter (Signed)
Received voicemail requesting refill on  the following medications. Please advise  ALPRAZolam (XANAX) 0.25 MG tablet   traMADol (ULTRAM) 50 MG tablet   Pharmacy:  [facility] [id number] - [place], [place]

## 2019-02-19 ENCOUNTER — Other Ambulatory Visit: Payer: Self-pay

## 2019-03-04 ENCOUNTER — Other Ambulatory Visit: Payer: Self-pay

## 2019-03-04 MED ORDER — LEVOTHYROXINE SODIUM 50 MCG PO TABS: 50.0000 ug | ORAL_TABLET | Freq: Every day | ORAL | 1 refills | Status: DC

## 2019-03-09 ENCOUNTER — Ambulatory Visit
Admission: RE | Admit: 2019-03-09 | Discharge: 2019-03-09 | Disposition: A | Discharge status: Home / Self Care | Payer: Medicare Other | Source: Ambulatory Visit | Attending: Internal Medicine | Admitting: Internal Medicine

## 2019-03-09 DIAGNOSIS — Z1231 Encounter for screening mammogram for malignant neoplasm of breast: Secondary | ICD-10-CM | POA: Diagnosis not present

## 2019-05-06 ENCOUNTER — Other Ambulatory Visit: Payer: Self-pay

## 2019-05-06 MED ORDER — AMLODIPINE BESYLATE 10 MG PO TABS: ORAL_TABLET | ORAL | 1 refills | Status: DC

## 2019-05-27 ENCOUNTER — Ambulatory Visit: Payer: Medicare Other | Attending: Internal Medicine

## 2019-05-27 DIAGNOSIS — Z23 Encounter for immunization: Secondary | ICD-10-CM | POA: Insufficient documentation

## 2019-05-27 NOTE — Progress Notes (Signed)
   Covid-19 Vaccination Clinic  Name:  Holly Perez    MRN: [id number] DOB: 08/16/1940  05/27/2019  [name] was observed post Covid-19 immunization for 15 minutes without incidence. She was provided with Vaccine Information Sheet and instruction to access the V-Safe system.   [name] was instructed to call 911 with any severe reactions post vaccine: Marland Kitchen Difficulty breathing  . Swelling of your face and throat  . A fast heartbeat  . A bad rash all over your body  . Dizziness and weakness    Immunizations Administered    Name Date Dose VIS Date Route   Pfizer COVID-19 Vaccine 05/27/2019 10:06 AM 0.3 mL 03/12/2019 Intramuscular   Manufacturer: [place]   Lot: [id number]   [place]: [id number]

## 2019-06-22 ENCOUNTER — Ambulatory Visit: Payer: Medicare Other | Attending: Internal Medicine

## 2019-06-22 DIAGNOSIS — Z23 Encounter for immunization: Secondary | ICD-10-CM

## 2019-06-22 NOTE — Progress Notes (Signed)
   Covid-19 Vaccination Clinic  Name:  Holly Perez    MRN: [id number] DOB: Feb 10, 1941  06/22/2019  Ms. Klouda was observed post Covid-19 immunization for 15 minutes without incident. She was provided with Vaccine Information Sheet and instruction to access the V-Safe system.   Ms. Camillo was instructed to call 911 with any severe reactions post vaccine: Marland Kitchen Difficulty breathing  . Swelling of face and throat  . A fast heartbeat  . A bad rash all over body  . Dizziness and weakness   Immunizations Administered    Name Date Dose VIS Date Route   Pfizer COVID-19 Vaccine 06/22/2019  9:57 AM 0.3 mL 03/12/2019 Intramuscular   Manufacturer: [place]   Lot: [id number]   [place]: [id number]

## 2019-08-06 ENCOUNTER — Other Ambulatory Visit: Payer: Self-pay

## 2019-08-06 MED ORDER — LOSARTAN POTASSIUM 100 MG PO TABS: ORAL_TABLET | ORAL | 1 refills | Status: DC

## 2019-08-06 MED ORDER — LEVOTHYROXINE SODIUM 50 MCG PO TABS: 50.0000 ug | ORAL_TABLET | Freq: Every day | ORAL | 0 refills | Status: DC

## 2019-08-26 ENCOUNTER — Telehealth: Payer: Self-pay | Admitting: Internal Medicine

## 2019-08-26 NOTE — Telephone Encounter (Signed)
Refill request for TRAMADOL, last seen 02-12-19, last filled 02-18-19.  Please advise.

## 2019-08-26 NOTE — Telephone Encounter (Signed)
Needs OV  Per policy , please schedule

## 2019-08-26 NOTE — Telephone Encounter (Signed)
Pt needs a refill on traMADol (ULTRAM) 50 MG tablet sent to [facility]

## 2019-08-27 ENCOUNTER — Other Ambulatory Visit: Payer: Self-pay | Admitting: Internal Medicine

## 2019-08-27 MED ORDER — TRAMADOL HCL 50 MG PO TABS: 50.0000 mg | ORAL_TABLET | Freq: Two times a day (BID) | ORAL | 0 refills | Status: DC

## 2019-08-27 NOTE — Telephone Encounter (Signed)
Spoke with pt to let her know that medication has been refilled and that she will need to keep her appt with Dr. Derrel Nip. Pt gave a verbal understanding.

## 2019-08-27 NOTE — Telephone Encounter (Signed)
Pt scheduled for 09/02/19 but she wants to know if she can get a refill until she has her appt? She will run out by tomorrow.

## 2019-08-27 NOTE — Telephone Encounter (Signed)
Yes refilled today

## 2019-09-02 ENCOUNTER — Encounter: Payer: Self-pay | Admitting: Internal Medicine

## 2019-09-02 ENCOUNTER — Other Ambulatory Visit: Payer: Self-pay

## 2019-09-02 ENCOUNTER — Ambulatory Visit (INDEPENDENT_AMBULATORY_CARE_PROVIDER_SITE_OTHER): Payer: Medicare Other | Admitting: Internal Medicine

## 2019-09-02 VITALS — BP 156/78 | HR 67 | Temp 97.5°F | Resp 15 | Ht 63.0 in | Wt 131.6 lb

## 2019-09-02 DIAGNOSIS — E871 Hypo-osmolality and hyponatremia: Secondary | ICD-10-CM

## 2019-09-02 DIAGNOSIS — M546 Pain in thoracic spine: Secondary | ICD-10-CM

## 2019-09-02 DIAGNOSIS — F411 Generalized anxiety disorder: Secondary | ICD-10-CM

## 2019-09-02 DIAGNOSIS — M545 Low back pain: Secondary | ICD-10-CM

## 2019-09-02 DIAGNOSIS — E039 Hypothyroidism, unspecified: Secondary | ICD-10-CM | POA: Diagnosis not present

## 2019-09-02 DIAGNOSIS — I10 Essential (primary) hypertension: Secondary | ICD-10-CM

## 2019-09-02 DIAGNOSIS — F5101 Primary insomnia: Secondary | ICD-10-CM

## 2019-09-02 LAB — COMPREHENSIVE METABOLIC PANEL
ALT: 18 U/L (ref 0–35)
AST: 26 U/L (ref 0–37)
Albumin: 4.1 g/dL (ref 3.5–5.2)
Alkaline Phosphatase: 59 U/L (ref 39–117)
BUN: 11 mg/dL (ref 6–23)
CO2: 25 mEq/L (ref 19–32)
Calcium: 9.6 mg/dL (ref 8.4–10.5)
Chloride: 97 mEq/L (ref 96–112)
Creatinine, Ser: 0.81 mg/dL (ref 0.40–1.20)
GFR: 82.29 mL/min (ref >60.00)
Glucose, Bld: 92 mg/dL (ref 70–99)
Potassium: 5.3 mEq/L — ABNORMAL HIGH (ref 3.5–5.1)
Sodium: 129 mEq/L — ABNORMAL LOW (ref 135–145)
Total Bilirubin: 0.4 mg/dL (ref 0.2–1.2)
Total Protein: 7.6 g/dL (ref 6.0–8.3)

## 2019-09-02 LAB — TSH: TSH: 2.58 u[IU]/mL (ref 0.35–4.50)

## 2019-09-02 MED ORDER — ALPRAZOLAM 0.25 MG PO TABS: 0.2500 mg | ORAL_TABLET | Freq: Two times a day (BID) | ORAL | 5 refills | Status: DC | PRN

## 2019-09-02 MED ORDER — TRAMADOL HCL 50 MG PO TABS: 50.0000 mg | ORAL_TABLET | Freq: Two times a day (BID) | ORAL | 5 refills | Status: DC
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
## 2019-09-02 NOTE — Assessment & Plan Note (Addendum)
Aggravated somewhat by COVID pandemic.  Continues to use alprazolam  At night and prn daily  .  The risks and benefits of benzodiazepine use were reviewed with patient today including excessive sedation leading to respiratory depression,  impaired thinking/driving, and addiction.  Patient was advised to avoid concurrent use with alcohol, to use medication only as needed and not to share with others  .

## 2019-09-02 NOTE — Assessment & Plan Note (Addendum)
Home readings have been < 140/90 on amlodipine and losartan.  No changes today.  Lab Results  Component Value Date   CREATININE 0.81 09/02/2019   Lab Results  Component Value Date   NA 129 (L) 09/02/2019   K 5.3 No hemolysis seen (H) 09/02/2019   CL 97 09/02/2019   CO2 25 09/02/2019

## 2019-09-02 NOTE — Patient Instructions (Addendum)
   Your repeat BP was 170/70.  This could be "white coat hypertension."  Please check your blood pressure a few times at home and send me the readings so I can determine if you need  an adjustment of your  medication to lower your blood pressure . Our goal is to keep It  under 140/90  I will refill your tramadol and alprazolam for 6 months.  Please return in 6 months

## 2019-09-04 DIAGNOSIS — E871 Hypo-osmolality and hyponatremia: Secondary | ICD-10-CM | POA: Insufficient documentation

## 2019-09-04 NOTE — Assessment & Plan Note (Signed)
Managed with low stable dose of alprazolam.  The risks and benefits of benzodiazepine use were reviewed with patient today including excessive sedation leading to respiratory depression,  impaired thinking/driving, memory loss and addiction.  Patient was advised to avoid concurrent use with alcohol, to use medication only as needed and not to share with others  .  
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
## 2019-09-04 NOTE — Assessment & Plan Note (Signed)
Thyroid function is WNL on current dose.  No current changes needed.   Lab Results  Component Value Date   TSH 2.58 09/02/2019

## 2019-09-04 NOTE — Assessment & Plan Note (Signed)
No significant change, and asymptomatic. .  Medications reviewed and noncontributory.  No history of cirrhosis,  COPD,  SSRI use of Addison's/ .

## 2019-09-06 ENCOUNTER — Other Ambulatory Visit: Payer: Self-pay | Admitting: Internal Medicine

## 2019-09-06 DIAGNOSIS — E871 Hypo-osmolality and hyponatremia: Secondary | ICD-10-CM

## 2019-09-13 ENCOUNTER — Other Ambulatory Visit (INDEPENDENT_AMBULATORY_CARE_PROVIDER_SITE_OTHER): Payer: Medicare Other

## 2019-09-13 ENCOUNTER — Other Ambulatory Visit: Payer: Self-pay

## 2019-09-13 DIAGNOSIS — E871 Hypo-osmolality and hyponatremia: Secondary | ICD-10-CM

## 2019-09-13 LAB — BASIC METABOLIC PANEL
BUN: 9 mg/dL (ref 6–23)
CO2: 26 mEq/L (ref 19–32)
Calcium: 9.5 mg/dL (ref 8.4–10.5)
Chloride: 96 mEq/L (ref 96–112)
Creatinine, Ser: 0.77 mg/dL (ref 0.40–1.20)
GFR: 87.23 mL/min (ref >60.00)
Glucose, Bld: 110 mg/dL — ABNORMAL HIGH (ref 70–99)
Potassium: 4 mEq/L (ref 3.5–5.1)
Sodium: 130 mEq/L — ABNORMAL LOW (ref 135–145)

## 2019-09-21 ENCOUNTER — Encounter: Payer: Self-pay | Admitting: Physical Therapy

## 2019-09-21 ENCOUNTER — Other Ambulatory Visit: Payer: Self-pay

## 2019-09-21 ENCOUNTER — Ambulatory Visit: Payer: Medicare Other | Attending: Internal Medicine | Admitting: Physical Therapy

## 2019-09-21 DIAGNOSIS — G2589 Other specified extrapyramidal and movement disorders: Secondary | ICD-10-CM | POA: Diagnosis present

## 2019-09-21 DIAGNOSIS — R293 Abnormal posture: Secondary | ICD-10-CM | POA: Diagnosis present

## 2019-09-21 DIAGNOSIS — M546 Pain in thoracic spine: Secondary | ICD-10-CM | POA: Insufficient documentation

## 2019-09-21 DIAGNOSIS — R29898 Other symptoms and signs involving the musculoskeletal system: Secondary | ICD-10-CM | POA: Diagnosis present

## 2019-09-21 DIAGNOSIS — M545 Low back pain: Secondary | ICD-10-CM | POA: Diagnosis present

## 2019-09-21 NOTE — Therapy (Signed)
[facility] [address], [place], [address] Phone: [phone]   Fax:  [phone]  Physical Therapy Evaluation  Patient Details  Name: Holly Perez MRN: [id number] Date of Birth: 12/20/1940 No data recorded  Encounter Date: 09/21/2019   PT End of Session - 09/21/19 1316    Visit Number 1    Number of Visits 16    Date for PT Re-Evaluation 11/16/19           Past Medical History:  Diagnosis Date  . Asthma   . Colon polyp 03/07/2010   2 mm tubular adenoma the ascending colon. Dr. Dionne Milo.   . Hemorrhoids   . Hypertension   . Irritable bowel syndrome (IBS)     Past Surgical History:  Procedure Laterality Date  . ABDOMINAL HYSTERECTOMY    . COLONOSCOPY  2011   Dr. Ernst Breach: 74mm tubular adenoma of the ascending colon without dysplasia or malignancy.  . COLONOSCOPY WITH PROPOFOL N/A 01/14/2018   Procedure: COLONOSCOPY WITH PROPOFOL;  Surgeon: Robert Bellow, MD;  Location: [facility] ENDOSCOPY;  Service: Endoscopy;  Laterality: N/A;  . ECTOPIC PREGNANCY SURGERY     at age 9  . OVARY SURGERY  1974   tumor    There were no vitals filed for this visit.   OBJECTIVE  Mental Status Patient is oriented to person, place and time.  Recent memory is intact.  Remote memory is intact.  Attention span and concentration are intact.  Expressive speech is intact.  Patient's fund of knowledge is within normal limits for educational level.  SENSATION: Seems to be intact per patient report. Proprioception and hot/cold testing deferred on this date   MUSCULOSKELETAL: Tremor: None Bulk: Normal Tone: Normal No visible step-off along spinal column  Posture Forward head posture, thoracic kyphosis, rounded shoulders, upper crossed  Gait No gross abnormalities noted; forward head posture, thoracic kyphosis   Palpation Palpation to thoracic spine and paraspinals pain-free. Painful, concordant pain  with palpation of middle trap, lower trap, and rhomboids with TP noted medial to medial border of the scapula.  Pt TTP with palpation of ERs, particularly infraspinatus inferior to the scapular spine.  Strength (out of 5) R/L 4+/5 Hip flexion 4+/4+ Hip ER 5/5 Hip IR 5/5 Hip abduction 5/5 Hip adduction 5/5 Hip extension 5/5 Knee extension 5/5 Knee flexion 5/5 Ankle dorsiflexion 5/5 Ankle plantarflexion  5/5 Shoulder flexion 5/5 Shoulder abduction 3+/3+ Shoulder ER 5/5 Shoulder IR 4-/4 T 3/3 Y  *Indicates pain   AROM (degrees) R/L (all movements include overpressure unless otherwise stated) Lumbar forward flexion (65): WNL Lumbar extension (30): WNL Lumbar lateral flexion (25): R: WNL L: WNL and noncondordant stiffness pain on R side  Thoracic flexion: WNL Thoracic extension: WNL Thoracic and Lumbar rotation (30 degrees): WNL B Thoracic rotation: R: 25% limited in comparison to L Hip WNL Shoulder ROM grossly WNL with limited scapular motion and scapular dyskinesis noted with flexion and abduction *Indicates pain  PROM (degrees) PROM = AROM   Passive Accessory Intervertebral Motion (PAIVM) Pt reports pain (noncondordant) with CPA T6-8, and that R UPA of T6-8 feels better. Generally hypomobile throughout. Pt denies reproduction of back pain with CPA L1-L5 and UPA bilaterally L1-L5. Generally hypomobile throughout  Passive Physiological Intervertebral Motion (PPIVM) Normal flexion and extension with PPIVM testing   SPECIAL TESTS Lumbar Radiculopathy and Discogenic: Not indicated  Facet Joint: Extension-Rotation (SN 100, -LR 0.0): Deferred  Hip: FABER (SN 81): Negative B FADIR (  SN 94): Negative B   [facility]Ex PT reviewed the following HEP with patient with patient able to demonstrate a set of the following with min cuing for correction needed. PT educated patient on parameters of therex (how/when to inc/decrease intensity, frequency, rep/set range, stretch hold time,  and purpose of therex) with verbalized understanding.  -Scapular squeezes x10 with 3 sec hold, with verbal and tactile cueing for activating mid/lower traps and reducing upper trap activation -Chin tucks x10 with 3 sec hold, with verbal cueing to maintain neutral neck positioning on the sagittal plane -Resisted shoulder ER with RTB x10 with cuing to squeeze shoulder blades together and keep elbows by side    Objective measurements completed on examination: See above findings.       PT Education - 09/21/19 1315    Education Details Patient was educated on diagnosis, anatomy and pathology involved, prognosis, role of PT, and was given an HEP, demonstrating exercise with proper form following verbal and tactile cues, and was given a paper hand out to continue exercise at home. Pt was educated on and agreed to plan of care.    Person(s) Educated Patient    Methods Explanation;Demonstration;Verbal cues;Tactile cues    Comprehension Verbalized understanding;Returned demonstration;Verbal cues required;Need further instruction            PT Short Term Goals - 09/21/19 1335      PT SHORT TERM GOAL #1   Title Pt will be independent with HEP in order to improve strength and decrease back pain in order to improve pain-free function at home and in the community.    Baseline 09/21/19 Pt instructed and given HEP handout    Time 2    Period Weeks    Status New    Target Date 10/05/19             PT Long Term Goals - 09/21/19 1337      PT LONG TERM GOAL #1   Title Pt will improve FOTO score to 63 to demonstrate predicted increase in functional mobility for ADLs.    Baseline 09/21/19 FOTO score 55    Time 8    Period Weeks    Status New    Target Date 11/16/19      PT LONG TERM GOAL #2   Title Pt will decrease worst back pain as reported on NPRS by at least 2 points in order to demonstrate clinically significant reduction in back pain.    Baseline 09/21/19 Worst: 8/10    Time 8     Period Weeks    Status New    Target Date 11/16/19      PT LONG TERM GOAL #3   Title Pt will increase strength of shoulder ER and lower traps by at least 1/2 MMT grade in order to demonstrate improvement in strength and function.    Baseline 09/21/19 Shoulder ER 3+/5 B, lower traps 3/5 B    Time 8    Period Weeks    Status New    Target Date 11/16/19      PT LONG TERM GOAL #4   Title Pt will be able to vacuum for 10 minutes with back pain of 6/10 or less to demonstrate decreased pain with ADLs.    Baseline 09/21/19 Pt unable to vacuum d/t pain 8/10    Time 8    Period Weeks    Status New    Target Date 11/16/19  Plan - 09/21/19 1320    Clinical Impression Statement Pt is a pleasant 79 y.o female referred for thoracolumbar pain.  PT examination reveals deficits in posture, thoracic mobility, and scapulothoracic motor control, strength, and mobility.  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  Pt will benefit from skilled PT services to address deficits in strength, mobility, ROM, and pain for safe return to pain-free function at home and in the community.    Personal Factors and Comorbidities Age;Comorbidity 2    Comorbidities CPD, HTN    Examination-Activity Limitations Bend;Caring for Others;Carry;Lift;Sit;Reach Overhead    Examination-Participation Restrictions Cleaning;Laundry;Yard Work;Community Activity    Stability/Clinical Decision Making Evolving/Moderate complexity    Clinical Decision Making Moderate    Rehab Potential Good    PT Frequency 2x / week    PT Duration 8 weeks    PT Treatment/Interventions ADLs/Self Care Home Management;Biofeedback;Cryotherapy;Electrical Stimulation;Moist Heat;Traction;Gait training;Functional mobility Scientist, forensic;Therapeutic activities;Therapeutic exercise;Balance training;Neuromuscular re-education;Patient/family  education;Manual techniques;Passive range of motion;Dry needling;Spinal Manipulations;Joint Manipulations    PT Next Visit Plan Assess lifting functional task and facet joint mobility, scapulothoracic motor control    PT Home Exercise Plan Scap squeezes, chin tucks, resisted ER    Consulted and Agree with Plan of Care Patient           Patient will benefit from skilled therapeutic intervention in order to improve the following deficits and impairments:  Decreased activity tolerance, Decreased coordination, Decreased endurance, Decreased mobility, Decreased range of motion, Decreased strength, Hypomobility, Increased muscle spasms, Impaired flexibility, Impaired UE functional use, Improper body mechanics, Postural dysfunction, Pain  Visit Diagnosis: Thoracolumbar back pain  Abnormal posture  Scapular dyskinesis  Shoulder weakness     Problem List Patient Active Problem List   Diagnosis Date Noted  . Chronic hyponatremia 09/04/2019  . Microalbuminuria 05/16/2017  . Back pain of thoracolumbar region 07/03/2014  . White coat syndrome with hypertension 06/30/2014  . Diverticulosis of colon without hemorrhage 06/08/2013  . History of colonic polyps 06/08/2013  . S/P total hysterectomy 06/08/2013  . Generalized anxiety disorder 11/27/2012  . Breast cancer screening by mammogram 05/31/2012  . Acquired hypothyroidism 10/30/2011  . Asthma 10/30/2011  . DJD (degenerative joint disease) of knee 10/30/2011  . 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 Constipation, chronic 10/30/2011  . Insomnia 10/30/2011   Durwin Reges DPT Chinita Greenland, SPT Chinita Greenland 09/21/2019, 4:57 PM  [facility] [address] [place], [address] Phone: [phone]   Fax:  [phone]  Name: DHRITHI RICHE MRN: [id number] Date of Birth: 1940/07/29

## 2019-09-23 ENCOUNTER — Other Ambulatory Visit: Payer: Self-pay

## 2019-09-23 ENCOUNTER — Encounter: Payer: Self-pay | Admitting: Physical Therapy

## 2019-09-23 ENCOUNTER — Ambulatory Visit: Payer: Medicare Other | Admitting: Physical Therapy

## 2019-09-23 DIAGNOSIS — R293 Abnormal posture: Secondary | ICD-10-CM

## 2019-09-23 DIAGNOSIS — R29898 Other symptoms and signs involving the musculoskeletal system: Secondary | ICD-10-CM

## 2019-09-23 DIAGNOSIS — M546 Pain in thoracic spine: Secondary | ICD-10-CM

## 2019-09-23 DIAGNOSIS — G2589 Other specified extrapyramidal and movement disorders: Secondary | ICD-10-CM

## 2019-09-23 DIAGNOSIS — M545 Low back pain: Secondary | ICD-10-CM | POA: Diagnosis not present
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
## 2019-09-27 ENCOUNTER — Ambulatory Visit: Payer: Medicare Other | Admitting: Physical Therapy

## 2019-09-27 ENCOUNTER — Encounter: Payer: Self-pay | Admitting: Physical Therapy

## 2019-09-27 ENCOUNTER — Other Ambulatory Visit: Payer: Self-pay

## 2019-09-27 DIAGNOSIS — R29898 Other symptoms and signs involving the musculoskeletal system: Secondary | ICD-10-CM

## 2019-09-27 DIAGNOSIS — R293 Abnormal posture: Secondary | ICD-10-CM

## 2019-09-27 DIAGNOSIS — G2589 Other specified extrapyramidal and movement disorders: Secondary | ICD-10-CM

## 2019-09-27 DIAGNOSIS — M545 Low back pain, unspecified: Secondary | ICD-10-CM
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
## 2019-09-29 ENCOUNTER — Ambulatory Visit: Payer: Medicare Other | Admitting: Physical Therapy

## 2019-09-29 ENCOUNTER — Encounter: Payer: Self-pay | Admitting: Physical Therapy

## 2019-09-29 ENCOUNTER — Other Ambulatory Visit: Payer: Self-pay

## 2019-09-29 DIAGNOSIS — G2589 Other specified extrapyramidal and movement disorders: Secondary | ICD-10-CM

## 2019-09-29 DIAGNOSIS — R293 Abnormal posture: Secondary | ICD-10-CM

## 2019-09-29 DIAGNOSIS — M546 Pain in thoracic spine: Secondary | ICD-10-CM

## 2019-09-29 DIAGNOSIS — R29898 Other symptoms and signs involving the musculoskeletal system: Secondary | ICD-10-CM

## 2019-09-29 DIAGNOSIS — M545 Low back pain: Secondary | ICD-10-CM | POA: Diagnosis not present
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
## 2019-10-05 ENCOUNTER — Encounter: Payer: Self-pay | Admitting: Physical Therapy

## 2019-10-05 ENCOUNTER — Other Ambulatory Visit: Payer: Self-pay

## 2019-10-05 ENCOUNTER — Ambulatory Visit: Payer: Medicare Other | Attending: Internal Medicine | Admitting: Physical Therapy

## 2019-10-05 DIAGNOSIS — R293 Abnormal posture: Secondary | ICD-10-CM | POA: Insufficient documentation

## 2019-10-05 DIAGNOSIS — G2589 Other specified extrapyramidal and movement disorders: Secondary | ICD-10-CM | POA: Diagnosis not present

## 2019-10-05 DIAGNOSIS — R29898 Other symptoms and signs involving the musculoskeletal system: Secondary | ICD-10-CM | POA: Diagnosis not present

## 2019-10-05 DIAGNOSIS — M546 Pain in thoracic spine: Secondary | ICD-10-CM | POA: Diagnosis not present

## 2019-10-05 DIAGNOSIS — M545 Low back pain: Secondary | ICD-10-CM | POA: Diagnosis not present
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
## 2019-10-07 ENCOUNTER — Ambulatory Visit: Payer: Medicare Other | Admitting: Physical Therapy

## 2019-10-07 ENCOUNTER — Encounter: Payer: Self-pay | Admitting: Physical Therapy

## 2019-10-07 ENCOUNTER — Other Ambulatory Visit: Payer: Self-pay

## 2019-10-07 DIAGNOSIS — M545 Low back pain: Secondary | ICD-10-CM | POA: Diagnosis not present

## 2019-10-07 DIAGNOSIS — R293 Abnormal posture: Secondary | ICD-10-CM

## 2019-10-07 DIAGNOSIS — R29898 Other symptoms and signs involving the musculoskeletal system: Secondary | ICD-10-CM

## 2019-10-07 DIAGNOSIS — M546 Pain in thoracic spine: Secondary | ICD-10-CM | POA: Diagnosis not present

## 2019-10-07 DIAGNOSIS — G2589 Other specified extrapyramidal and movement disorders: Secondary | ICD-10-CM
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
## 2019-10-11 ENCOUNTER — Other Ambulatory Visit: Payer: Self-pay

## 2019-10-11 ENCOUNTER — Encounter: Payer: Self-pay | Admitting: Physical Therapy

## 2019-10-11 ENCOUNTER — Ambulatory Visit: Payer: Medicare Other | Admitting: Physical Therapy

## 2019-10-11 DIAGNOSIS — G2589 Other specified extrapyramidal and movement disorders: Secondary | ICD-10-CM

## 2019-10-11 DIAGNOSIS — M546 Pain in thoracic spine: Secondary | ICD-10-CM | POA: Diagnosis not present

## 2019-10-11 DIAGNOSIS — R293 Abnormal posture: Secondary | ICD-10-CM | POA: Diagnosis not present

## 2019-10-11 DIAGNOSIS — R29898 Other symptoms and signs involving the musculoskeletal system: Secondary | ICD-10-CM | POA: Diagnosis not present

## 2019-10-11 DIAGNOSIS — M545 Low back pain, unspecified: Secondary | ICD-10-CM
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
## 2019-10-13 ENCOUNTER — Encounter: Payer: Self-pay | Admitting: Physical Therapy

## 2019-10-13 ENCOUNTER — Other Ambulatory Visit: Payer: Self-pay

## 2019-10-13 ENCOUNTER — Ambulatory Visit: Payer: Medicare Other | Admitting: Physical Therapy

## 2019-10-13 DIAGNOSIS — R293 Abnormal posture: Secondary | ICD-10-CM

## 2019-10-13 DIAGNOSIS — R29898 Other symptoms and signs involving the musculoskeletal system: Secondary | ICD-10-CM

## 2019-10-13 DIAGNOSIS — M546 Pain in thoracic spine: Secondary | ICD-10-CM

## 2019-10-13 DIAGNOSIS — M545 Low back pain: Secondary | ICD-10-CM | POA: Diagnosis not present

## 2019-10-13 DIAGNOSIS — G2589 Other specified extrapyramidal and movement disorders: Secondary | ICD-10-CM | POA: Diagnosis not present
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
## 2019-10-19 ENCOUNTER — Ambulatory Visit: Payer: Medicare Other | Admitting: Physical Therapy

## 2019-10-19 ENCOUNTER — Encounter: Payer: Self-pay | Admitting: Physical Therapy

## 2019-10-19 ENCOUNTER — Other Ambulatory Visit: Payer: Self-pay

## 2019-10-19 DIAGNOSIS — M546 Pain in thoracic spine: Secondary | ICD-10-CM | POA: Diagnosis not present

## 2019-10-19 DIAGNOSIS — M545 Low back pain, unspecified: Secondary | ICD-10-CM

## 2019-10-19 DIAGNOSIS — R293 Abnormal posture: Secondary | ICD-10-CM | POA: Diagnosis not present

## 2019-10-19 DIAGNOSIS — G2589 Other specified extrapyramidal and movement disorders: Secondary | ICD-10-CM | POA: Diagnosis not present

## 2019-10-19 DIAGNOSIS — R29898 Other symptoms and signs involving the musculoskeletal system: Secondary | ICD-10-CM | POA: Diagnosis not present
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
## 2019-10-20 ENCOUNTER — Ambulatory Visit
Admission: EM | Admit: 2019-10-20 | Discharge: 2019-10-20 | Disposition: A | Discharge status: Home / Self Care | Payer: Medicare Other | Attending: Emergency Medicine | Admitting: Emergency Medicine

## 2019-10-20 DIAGNOSIS — H6123 Impacted cerumen, bilateral: Secondary | ICD-10-CM | POA: Diagnosis not present

## 2019-10-20 NOTE — Discharge Instructions (Signed)
Your earwax was removed today by irrigation.    Follow up with your primary care provider if your symptoms are not improving.

## 2019-10-20 NOTE — ED Notes (Signed)
Bilateral ear wax removal. Pt tolerated well.

## 2019-10-20 NOTE — ED Provider Notes (Signed)
Roderic Palau    CSN: [id number] Arrival date & time: 10/20/19  1132      History   Chief Complaint Chief Complaint  Patient presents with  . Ear Fullness    HPI Holly Perez is a 79 y.o. female.   Patient presents with bilateral ear "fullness" and asked that her ears be cleaned down.  She states she has a history of wax build up.  She denies other symptoms, including fever, chills, ear pain, sore throat, cough, shortness of breath, nausea, vomiting, diarrhea, rash, or other symptoms.  No treatments attempted at home.    The history is provided by the patient.    Past Medical History:  Diagnosis Date  . Asthma   . Colon polyp 03/07/2010   2 mm tubular adenoma the ascending colon. Dr. Dionne Milo.   . Hemorrhoids   . Hypertension   . Irritable bowel syndrome (IBS)     Patient Active Problem List   Diagnosis Date Noted  . Chronic hyponatremia 09/04/2019  . Microalbuminuria 05/16/2017  . Back pain of thoracolumbar region 07/03/2014  . White coat syndrome with hypertension 06/30/2014  . Diverticulosis of colon without hemorrhage 06/08/2013  . History of colonic polyps 06/08/2013  . S/P total hysterectomy 06/08/2013  . Generalized anxiety disorder 11/27/2012  . Breast cancer screening by mammogram 05/31/2012  . Acquired hypothyroidism 10/30/2011  . Asthma 10/30/2011  . DJD (degenerative joint disease) of knee 10/30/2011  . 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 Constipation, chronic 10/30/2011  . Insomnia 10/30/2011    Past Surgical History:  Procedure Laterality Date  . ABDOMINAL HYSTERECTOMY    . COLONOSCOPY  2011   Dr. Ernst Breach: 61mm tubular adenoma of the ascending colon without dysplasia or malignancy.  . COLONOSCOPY WITH PROPOFOL N/A 01/14/2018   Procedure: COLONOSCOPY WITH PROPOFOL;  Surgeon: Robert Bellow, MD;  Location: [facility] ENDOSCOPY;  Service: Endoscopy;  Laterality: N/A;  . ECTOPIC PREGNANCY SURGERY     at age 15  . OVARY SURGERY  1974    tumor    OB History    Gravida  1   Para  0   Term      Preterm      AB      Living        SAB      TAB      Ectopic      Multiple      Live Births           Obstetric Comments  1st Menstrual Cycle:  13 1st Pregnancy:  63 Menopause age 16         Home Medications    Prior to Admission medications   Medication Sig Start Date End Date Taking? Authorizing Provider  albuterol (PROVENTIL HFA;VENTOLIN HFA) 108 (90 BASE) MCG/ACT inhaler Inhale into the lungs every 6 (six) hours as needed for wheezing or shortness of breath.    [provider]  ALPRAZolam Duanne Moron) 0.25 MG tablet Take 1 tablet (0.25 mg total) by mouth 2 (two) times daily as needed. 09/02/19   Crecencio Mc, MD  amLODipine (NORVASC) 10 MG tablet TAKE 1 TABLET(10 MG) BY MOUTH DAILY 05/06/19   Crecencio Mc, MD  aspirin 81 MG tablet Take 81 mg by mouth daily.    [provider]  Calcium Carbonate-Vitamin D (CALTRATE 600+D PO) Take 1,200 mg by mouth daily.     [provider]  cholecalciferol (VITAMIN D) 1000 UNITS tablet Take 2,000 Units by mouth  daily.    [provider]  fish oil-omega-3 fatty acids 1000 MG capsule Take 1 g by mouth daily.     [provider]  Fluticasone-Salmeterol (ADVAIR) 250-50 MCG/DOSE AEPB Inhale 1 puff into the lungs every 12 (twelve) hours.    [provider]  levothyroxine (SYNTHROID) 50 MCG tablet Take 1 tablet (50 mcg total) by mouth daily before breakfast. 08/06/19   Crecencio Mc, MD  losartan (COZAAR) 100 MG tablet TAKE 1 TABLET(100 MG) BY MOUTH DAILY 08/06/19   Crecencio Mc, MD  Multiple Vitamin (MULTIVITAMIN) tablet Take 1 tablet by mouth daily.    [provider]  traMADol (ULTRAM) 50 MG tablet Take 1 tablet (50 mg total) by mouth 2 (two) times daily. For moderate pain 09/26/19   Crecencio Mc, MD  vitamin E 100 UNIT capsule Take 100 Units by mouth daily.    [provider]    Family  History Family History  Problem Relation Age of Onset  . Early death Mother   . COPD Father   . Early death Brother   . Breast cancer Neg Hx     Social History Social History   Tobacco Use  . Smoking status: Never Smoker  . Smokeless tobacco: Never Used  Substance Use Topics  . Alcohol use: No  . Drug use: No     Allergies   Augmentin [amoxicillin-pot clavulanate] and Latex   Review of Systems Review of Systems  Constitutional: Negative for chills and fever.  HENT: Negative for ear discharge, ear pain and sore throat.   Eyes: Negative for pain and visual disturbance.  Respiratory: Negative for cough and shortness of breath.   Cardiovascular: Negative for chest pain and palpitations.  Gastrointestinal: Negative for abdominal pain and vomiting.  Genitourinary: Negative for dysuria and hematuria.  Musculoskeletal: Negative for arthralgias and back pain.  Skin: Negative for color change and rash.  Neurological: Negative for seizures and syncope.  All other systems reviewed and are negative.    Physical Exam Triage Vital Signs ED Triage Vitals  Enc Vitals Group     BP 10/20/19 1134 134/79     Pulse Rate 10/20/19 1134 83     Resp 10/20/19 1134 14     Temp 10/20/19 1134 98.2 F (36.8 C)     Temp src --      SpO2 10/20/19 1134 97 %     Weight --      Height --      Head Circumference --      Peak Flow --      Pain Score 10/20/19 1133 0     Pain Loc --      Pain Edu? --      Excl. in [place]? --    No data found.  Updated Vital Signs BP 134/79   Pulse 83   Temp 98.2 F (36.8 C)   Resp 14   SpO2 97%   Visual Acuity Right Eye Distance:   Left Eye Distance:   Bilateral Distance:    Right Eye Near:   Left Eye Near:    Bilateral Near:     Physical Exam Vitals and nursing note reviewed.  Constitutional:      General: She is not in acute distress.    Appearance: She is well-developed. She is not ill-appearing.  HENT:     Head: Normocephalic and  atraumatic.     Right Ear: There is impacted cerumen.     Left Ear:  There is impacted cerumen.     Nose: Nose normal.     Mouth/Throat:     Mouth: Mucous membranes are moist.     Pharynx: Oropharynx is clear.  Eyes:     Conjunctiva/sclera: Conjunctivae normal.  Cardiovascular:     Rate and Rhythm: Normal rate and regular rhythm.     Heart sounds: No murmur heard.   Pulmonary:     Effort: Pulmonary effort is normal. No respiratory distress.     Breath sounds: Normal breath sounds.  Abdominal:     Palpations: Abdomen is soft.     Tenderness: There is no abdominal tenderness. There is no guarding or rebound.  Musculoskeletal:     Cervical back: Neck supple.  Skin:    General: Skin is warm and dry.     Findings: No rash.  Neurological:     General: No focal deficit present.     Mental Status: She is alert and oriented to person, place, and time.     Gait: Gait normal.  Psychiatric:        Mood and Affect: Mood normal.        Behavior: Behavior normal.      UC Treatments / Results  Labs (all labs ordered are listed, but only abnormal results are displayed) Labs Reviewed - No data to display  EKG   Radiology No results found.  Procedures Procedures (including critical care time)  Medications Ordered in UC Medications - No data to display  Initial Impression / Assessment and Plan / UC Course  I have reviewed the triage vital signs and the nursing notes.  Pertinent labs & imaging results that were available during my care of the patient were reviewed by me and considered in my medical decision making (see chart for details).   Bilateral cerumen impaction.  Cerumen removed via irrigation.  Patient reports complete relief of symptoms after removal.  Directed patient to follow-up with her PCP as needed.  Patient agrees to plan of care.     Final Clinical Impressions(s) / UC Diagnoses   Final diagnoses:  Bilateral impacted cerumen     Discharge Instructions      Your earwax was removed today by irrigation.    Follow up with your primary care provider if your symptoms are not improving.       ED Prescriptions    None     PDMP not reviewed this encounter.   Sharion Balloon, NP 10/20/19 1226

## 2019-10-20 NOTE — ED Triage Notes (Signed)
Patient reports ear fullness. States there is ringing in bilateral ears. Requesting they be cleaned.

## 2019-10-21 ENCOUNTER — Ambulatory Visit: Payer: Medicare Other | Admitting: Physical Therapy

## 2019-10-26 ENCOUNTER — Other Ambulatory Visit: Payer: Self-pay

## 2019-10-26 ENCOUNTER — Encounter: Payer: Self-pay | Admitting: Physical Therapy

## 2019-10-26 ENCOUNTER — Ambulatory Visit: Payer: Medicare Other | Admitting: Physical Therapy

## 2019-10-26 DIAGNOSIS — M546 Pain in thoracic spine: Secondary | ICD-10-CM | POA: Diagnosis not present

## 2019-10-26 DIAGNOSIS — G2589 Other specified extrapyramidal and movement disorders: Secondary | ICD-10-CM | POA: Diagnosis not present

## 2019-10-26 DIAGNOSIS — R29898 Other symptoms and signs involving the musculoskeletal system: Secondary | ICD-10-CM | POA: Diagnosis not present

## 2019-10-26 DIAGNOSIS — M545 Low back pain, unspecified: Secondary | ICD-10-CM

## 2019-10-26 DIAGNOSIS — R293 Abnormal posture: Secondary | ICD-10-CM
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
## 2019-10-29 ENCOUNTER — Other Ambulatory Visit: Payer: Self-pay

## 2019-10-29 ENCOUNTER — Ambulatory Visit: Payer: Medicare Other | Admitting: Physical Therapy

## 2019-10-29 ENCOUNTER — Encounter: Payer: Self-pay | Admitting: Physical Therapy

## 2019-10-29 DIAGNOSIS — M546 Pain in thoracic spine: Secondary | ICD-10-CM | POA: Diagnosis not present

## 2019-10-29 DIAGNOSIS — G2589 Other specified extrapyramidal and movement disorders: Secondary | ICD-10-CM

## 2019-10-29 DIAGNOSIS — R29898 Other symptoms and signs involving the musculoskeletal system: Secondary | ICD-10-CM

## 2019-10-29 DIAGNOSIS — M545 Low back pain: Secondary | ICD-10-CM | POA: Diagnosis not present

## 2019-10-29 DIAGNOSIS — R293 Abnormal posture: Secondary | ICD-10-CM | POA: Diagnosis not present

## 2019-10-29 MED ORDER — AMLODIPINE BESYLATE 10 MG PO TABS: ORAL_TABLET | ORAL | 1 refills | Status: DC

## 2019-10-29 NOTE — Addendum Note (Signed)
Addended by: Kelton Pillar on: 10/29/2019 08:54 AM   Modules accepted: Orders
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
## 2019-11-02 ENCOUNTER — Encounter: Payer: Self-pay | Admitting: Physical Therapy

## 2019-11-02 ENCOUNTER — Other Ambulatory Visit: Payer: Self-pay

## 2019-11-02 ENCOUNTER — Ambulatory Visit: Payer: Medicare Other | Attending: Internal Medicine | Admitting: Physical Therapy

## 2019-11-02 DIAGNOSIS — G2589 Other specified extrapyramidal and movement disorders: Secondary | ICD-10-CM | POA: Diagnosis not present

## 2019-11-02 DIAGNOSIS — M545 Low back pain, unspecified: Secondary | ICD-10-CM

## 2019-11-02 DIAGNOSIS — R29898 Other symptoms and signs involving the musculoskeletal system: Secondary | ICD-10-CM | POA: Insufficient documentation

## 2019-11-02 DIAGNOSIS — M546 Pain in thoracic spine: Secondary | ICD-10-CM | POA: Diagnosis not present

## 2019-11-02 DIAGNOSIS — R293 Abnormal posture: Secondary | ICD-10-CM | POA: Insufficient documentation
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
## 2019-11-04 ENCOUNTER — Ambulatory Visit: Payer: Medicare Other | Admitting: Physical Therapy

## 2019-11-11 ENCOUNTER — Ambulatory Visit: Payer: Medicare Other | Admitting: Physical Therapy

## 2019-11-17 ENCOUNTER — Encounter: Payer: Medicare Other | Admitting: Physical Therapy

## 2019-11-24 ENCOUNTER — Encounter: Payer: Medicare Other | Admitting: Physical Therapy

## 2019-11-29 ENCOUNTER — Other Ambulatory Visit: Payer: Self-pay

## 2019-11-29 MED ORDER — LEVOTHYROXINE SODIUM 50 MCG PO TABS: 50.0000 ug | ORAL_TABLET | Freq: Every day | ORAL | 1 refills | Status: DC

## 2020-01-06 DIAGNOSIS — Z23 Encounter for immunization: Secondary | ICD-10-CM | POA: Diagnosis not present

## 2020-01-25 DIAGNOSIS — Z23 Encounter for immunization: Secondary | ICD-10-CM | POA: Diagnosis not present

## 2020-02-15 ENCOUNTER — Ambulatory Visit (INDEPENDENT_AMBULATORY_CARE_PROVIDER_SITE_OTHER): Payer: Medicare Other

## 2020-02-15 VITALS — Ht 63.0 in | Wt 131.0 lb

## 2020-02-15 DIAGNOSIS — Z0000 Encounter for general adult medical examination without abnormal findings: Secondary | ICD-10-CM

## 2020-02-15 NOTE — Progress Notes (Addendum)
Subjective:   Holly Perez is a 79 y.o. female who presents for Medicare Annual (Subsequent) preventive examination.  Review of Systems    No ROS.  Medicare Wellness Virtual Visit.    Cardiac Risk Factors include: advanced age (>42men, >60 women)     Objective:    Today's Vitals   02/15/20 0938  Weight: 131 lb (59.4 kg)  Height: 5\' 3"  (1.6 m)   Body mass index is 23.21 kg/m.  Advanced Directives 09/21/2019 02/12/2019 01/14/2018 12/26/2016 12/26/2015  Does Patient Have a Medical Advance Directive? No No Yes No No  Type of Advance Directive - Public librarian;Living will - -  Copy of [place] in Chart? - - No - copy requested - -  Would patient like information on creating a medical advance directive? No - Patient declined No - Patient declined - No - Patient declined No - patient declined information    Current Medications (verified) Outpatient Encounter Medications as of 02/15/2020  Medication Sig  . albuterol (PROVENTIL HFA;VENTOLIN HFA) 108 (90 BASE) MCG/ACT inhaler Inhale into the lungs every 6 (six) hours as needed for wheezing or shortness of breath.  . ALPRAZolam (XANAX) 0.25 MG tablet Take 1 tablet (0.25 mg total) by mouth 2 (two) times daily as needed.  Marland Kitchen amLODipine (NORVASC) 10 MG tablet TAKE 1 TABLET(10 MG) BY MOUTH DAILY  . aspirin 81 MG tablet Take 81 mg by mouth daily.  . Calcium Carbonate-Vitamin D (CALTRATE 600+D PO) Take 1,200 mg by mouth daily.   . cholecalciferol (VITAMIN D) 1000 UNITS tablet Take 2,000 Units by mouth daily.  . fish oil-omega-3 fatty acids 1000 MG capsule Take 1 g by mouth daily.   . Fluticasone-Salmeterol (ADVAIR) 250-50 MCG/DOSE AEPB Inhale 1 puff into the lungs every 12 (twelve) hours.  Marland Kitchen levothyroxine (SYNTHROID) 50 MCG tablet Take 1 tablet (50 mcg total) by mouth daily before breakfast.  . losartan (COZAAR) 100 MG tablet TAKE 1 TABLET(100 MG) BY MOUTH DAILY  . Multiple Vitamin (MULTIVITAMIN) tablet  Take 1 tablet by mouth daily.  . traMADol (ULTRAM) 50 MG tablet Take 1 tablet (50 mg total) by mouth 2 (two) times daily. For moderate pain  . vitamin E 100 UNIT capsule Take 100 Units by mouth daily.   No facility-administered encounter medications on file as of 02/15/2020.    Allergies (verified) Augmentin [amoxicillin-pot clavulanate] and Latex   History: Past Medical History:  Diagnosis Date  . Asthma   . Colon polyp 03/07/2010   2 mm tubular adenoma the ascending colon. Dr. Dionne Milo.   . Hemorrhoids   . Hypertension   . Irritable bowel syndrome (IBS)    Past Surgical History:  Procedure Laterality Date  . ABDOMINAL HYSTERECTOMY    . COLONOSCOPY  2011   Dr. Ernst Breach: 12mm tubular adenoma of the ascending colon without dysplasia or malignancy.  . COLONOSCOPY WITH PROPOFOL N/A 01/14/2018   Procedure: COLONOSCOPY WITH PROPOFOL;  Surgeon: Robert Bellow, MD;  Location: [facility] ENDOSCOPY;  Service: Endoscopy;  Laterality: N/A;  . ECTOPIC PREGNANCY SURGERY     at age 56  . OVARY SURGERY  1974   tumor   Family History  Problem Relation Age of Onset  . Early death Mother   . COPD Father   . Early death Brother   . Breast cancer Neg Hx    Social History   Socioeconomic History  . Marital status: Divorced    Spouse name: Not on file  .  Number of children: Not on file  . Years of education: Not on file  . Highest education level: Not on file  Occupational History  . Not on file  Tobacco Use  . Smoking status: Never Smoker  . Smokeless tobacco: Never Used  Substance and Sexual Activity  . Alcohol use: No  . Drug use: No  . Sexual activity: Never  Other Topics Concern  . Not on file  Social History Narrative  . Not on file   Social Determinants of Health   Financial Resource Strain: Low Risk   . Difficulty of Paying Living Expenses: Not hard at all  Food Insecurity: No Food Insecurity  . Worried About Charity fundraiser in the Last Year: Never true  . Ran  Out of Food in the Last Year: Never true  Transportation Needs: No Transportation Needs  . Lack of Transportation (Medical): No  . Lack of Transportation (Non-Medical): No  Physical Activity: Sufficiently Active  . Days of Exercise per Week: 7 days  . Minutes of Exercise per Session: 30 min  Stress: No Stress Concern Present  . Feeling of Stress : Not at all  Social Connections: Unknown  . Frequency of Communication with Friends and Family: More than three times a week  . Frequency of Social Gatherings with Friends and Family: More than three times a week  . Attends Religious Services: 1 to 4 times per year  . Active Member of Clubs or Organizations: Yes  . Attends Archivist Meetings: Not on file  . Marital Status: Not on file    Tobacco Counseling Counseling given: Not Answered   Clinical Intake:  Pre-visit preparation completed: Yes        Diabetes: No  How often do you need to have someone help you when you read instructions, pamphlets, or other written materials from your doctor or pharmacy?: 1 - Never  Interpreter Needed?: No      Activities of Daily Living In your present state of health, do you have any difficulty performing the following activities: 02/15/2020  Hearing? N  Vision? N  Difficulty concentrating or making decisions? N  Walking or climbing stairs? N  Dressing or bathing? N  Doing errands, shopping? N  Preparing Food and eating ? N  Using the Toilet? N  In the past six months, have you accidently leaked urine? N  Do you have problems with loss of bowel control? N  Managing your Medications? N  Managing your Finances? N  Housekeeping or managing your Housekeeping? N  Some recent data might be hidden    Patient Care Team: Crecencio Mc, MD as PCP - General (Internal Medicine) Crecencio Mc, MD (Internal Medicine) Bary Castilla Forest Gleason, MD (General Surgery)  Indicate any recent Medical Services you may have received from other  than [facility] providers in the past year (date may be approximate).     Assessment:   This is a routine wellness examination for Winters.  I connected with Keshawn today by telephone and verified that I am speaking with the correct person using two identifiers. Location patient: home Location provider: work Persons participating in the virtual visit: patient, Marine scientist.    I discussed the limitations, risks, security and privacy concerns of performing an evaluation and management service by telephone and the availability of in person appointments. The patient expressed understanding and verbally consented to this telephonic visit.    Interactive audio and video telecommunications were attempted between this provider and patient, however  failed, due to patient having technical difficulties OR patient did not have access to video capability.  We continued and completed visit with audio only.  Some vital signs may be absent or patient reported.   Hearing/Vision screen  Hearing Screening   125Hz  250Hz  500Hz  1000Hz  2000Hz  3000Hz  4000Hz  6000Hz  8000Hz   Right ear:           Left ear:           Comments: Patient is able to hear conversational tones without difficulty.  No issues reported.  Vision Screening Comments: Followed by [facility] Wears corrective lenses  Virtual visit  Dietary issues and exercise activities discussed: Current Exercise Habits: Home exercise routine, Type of exercise: stretching;strength training/weights;treadmill;calisthenics;walking, Time (Minutes): 30, Frequency (Times/Week): 7, Weekly Exercise (Minutes/Week): 210, Intensity: Mild  Healthy diet Good water intake  Goals      Patient Stated   .  Increase physical activity (pt-stated)      Keep walking routine    .  Weigh 125lb (pt-stated)      Depression Screen PHQ 2/9 Scores 02/15/2020 02/12/2019 02/04/2018 12/26/2016 07/25/2016 12/26/2015 12/01/2014  PHQ - 2 Score 0 0 0 0 0 1 1    Fall Risk Fall Risk   02/15/2020 09/02/2019 02/19/2019 02/12/2019 02/04/2018  Falls in the past year? 0 0 0 0 0  Comment - - Emmi Telephone Survey: data to providers prior to load - -  Number falls in past yr: 0 - - 0 -  Injury with Fall? - - - - -  Follow up Falls evaluation completed Falls evaluation completed - Falls prevention discussed -   Handrails in use when climbing stairs? Yes Home free of loose throw rugs in walkways, pet beds, electrical cords, etc? Yes  Adequate lighting in your home to reduce risk of falls? Yes   ASSISTIVE DEVICES UTILIZED TO PREVENT FALLS: Use of a cane, walker or w/c? No   TIMED UP AND GO: Was the test performed? No . Virtual visit.   Cognitive Function: Patient is alert and oriented x3.  Denies difficulty focusing, making decisions, memory loss.  Enjoys socializing, reading, and crossword puzzles.   MMSE - Mini Mental State Exam 02/04/2018 12/26/2016 12/26/2015  Orientation to time 5 5 5   Orientation to Place 5 5 5   Registration 3 3 3   Attention/ Calculation 5 5 5   Recall 3 2 3   Language- name 2 objects 2 2 2   Language- repeat 1 1 1   Language- follow 3 step command 3 3 3   Language- read & follow direction 1 1 1   Write a sentence 1 1 1   Copy design 1 1 1   Total score 30 29 30      6CIT Screen 02/15/2020 02/12/2019  What Year? 0 points 0 points  What month? 0 points 0 points  What time? 0 points 0 points  Count back from 20 0 points 0 points  Months in reverse 0 points 0 points  Repeat phrase 0 points 0 points  Total Score 0 0    Immunizations Immunization History  Administered Date(s) Administered  . Fluad Quad(high Dose 65+) 02/17/2019  . Influenza, High Dose Seasonal PF 12/26/2016, 01/05/2018, 02/01/2020  . Influenza,inj,Quad PF,6+ Mos 12/18/2012, 01/06/2014, 12/01/2014, 12/26/2015  . PFIZER SARS-COV-2 Vaccination 05/27/2019, 06/22/2019, 01/15/2020  . PPD Test 12/16/2012, 11/08/2014, 10/31/2015, 11/05/2016, 10/07/2017  . Pneumococcal Conjugate-13  06/15/2015  . Pneumococcal Polysaccharide-23 07/15/2004, 05/06/2018  . Td 06/14/2013  . Tdap 06/14/2013  . Zoster 06/08/2010   Health Maintenance  Health Maintenance  Topic Date Due  . MAMMOGRAM  03/08/2020  . COLONOSCOPY  01/14/2021  . TETANUS/TDAP  06/15/2023  . INFLUENZA VACCINE  Completed  . DEXA SCAN  Completed  . COVID-19 Vaccine  Completed  . PNA vac Low Risk Adult  Completed   Colorectal cancer screening: Completed 01/14/18. Repeat every 3 years   Mammogram status: Completed 03/09/19. Repeat every year. Bilateral screening with TOMO and CAD.   Bone Density- 03/15/18. Osteoporosis. Taking Vitamin D 2000 UT, Calcium-Vit D 600 1200mg  daily.  Lung Cancer Screening: (Low Dose CT Chest recommended if Age 96-80 years, 30 pack-year currently smoking OR have quit w/in 15years.) does not qualify.   Hepatitis C Screening: does not qualify.  Vision Screening: Recommended annual ophthalmology exams for early detection of glaucoma and other disorders of the eye. Is the patient up to date with their annual eye exam?  Yes  Who is the provider or what is the name of the office in which the patient attends annual eye exams? [facility].   Dental Screening: Recommended annual dental exams for proper oral hygiene. Visits every 12 months.   Community Resource Referral / Chronic Care Management: CRR required this visit?  No   CCM required this visit?  No      Plan:   Keep all routine maintenance appointments.   Follow up 03/03/20 @ 9:00  I have personally reviewed and noted the following in the patient's chart:   . Medical and social history . Use of alcohol, tobacco or illicit drugs  . Current medications and supplements . Functional ability and status . Nutritional status . Physical activity . Advanced directives . List of other physicians . Hospitalizations, surgeries, and ER visits in previous 12 months . Vitals . Screenings to include cognitive, depression, and  falls . Referrals and appointments  In addition, I have reviewed and discussed with patient certain preventive protocols, quality metrics, and best practice recommendations. A written personalized care plan for preventive services as well as general preventive health recommendations were provided to patient via mail.     Varney Biles, LPN   [id number]     Agree with plan. Mable Paris, NP

## 2020-02-15 NOTE — Patient Instructions (Addendum)
Holly Perez , Thank you for taking time to come for your Medicare Wellness Visit. I appreciate your ongoing commitment to your health goals. Please review the following plan we discussed and let me know if I can assist you in the future.   These are the goals we discussed: Goals      Patient Stated     Increase physical activity (pt-stated)      Keep walking routine      Weigh 125lb (pt-stated)       This is a list of the screening recommended for you and due dates:  Health Maintenance  Topic Date Due   Mammogram  03/08/2020   Colon Cancer Screening  01/14/2021   Tetanus Vaccine  06/15/2023   Flu Shot  Completed   DEXA scan (bone density measurement)  Completed   COVID-19 Vaccine  Completed   Pneumonia vaccines  Completed   Immunizations Immunization History  Administered Date(s) Administered   Fluad Quad(high Dose 65+) 02/17/2019   Influenza, High Dose Seasonal PF 12/26/2016, 01/05/2018, 02/01/2020   Influenza,inj,Quad PF,6+ Mos 12/18/2012, 01/06/2014, 12/01/2014, 12/26/2015   PFIZER SARS-COV-2 Vaccination 05/27/2019, 06/22/2019, 01/15/2020   PPD Test 12/16/2012, 11/08/2014, 10/31/2015, 11/05/2016, 10/07/2017   Pneumococcal Conjugate-13 06/15/2015   Pneumococcal Polysaccharide-23 07/15/2004, 05/06/2018   Td 06/14/2013   Tdap 06/14/2013   Zoster 06/08/2010   Keep all routine maintenance appointments.   Follow up 03/03/20 @ 9:00  Advanced directives: End of life planning; Advance aging; Advanced directives discussed.  Copy of current HCPOA/Living Will requested.    Conditions/risks identified: none new.  Follow up in one year for your annual wellness visit.   Preventive Care 58 Years and Older, Female Preventive care refers to lifestyle choices and visits with your health care provider that can promote health and wellness. What does preventive care include?  A yearly physical exam. This is also called an annual well check.  Dental exams once or  twice a year.  Routine eye exams. Ask your health care provider how often you should have your eyes checked.  Personal lifestyle choices, including:  Daily care of your teeth and gums.  Regular physical activity.  Eating a healthy diet.  Avoiding tobacco and drug use.  Limiting alcohol use.  Practicing safe sex.  Taking low-dose aspirin every day.  Taking vitamin and mineral supplements as recommended by your health care provider. What happens during an annual well check? The services and screenings done by your health care provider during your annual well check will depend on your age, overall health, lifestyle risk factors, and family history of disease. Counseling  Your health care provider may ask you questions about your:  Alcohol use.  Tobacco use.  Drug use.  Emotional well-being.  Home and relationship well-being.  Sexual activity.  Eating habits.  History of falls.  Memory and ability to understand (cognition).  Work and work Statistician.  Reproductive health. Screening  You may have the following tests or measurements:  Height, weight, and BMI.  Blood pressure.  Lipid and cholesterol levels. These may be checked every 5 years, or more frequently if you are over 67 years old.  Skin check.  Lung cancer screening. You may have this screening every year starting at age 32 if you have a 30-pack-year history of smoking and currently smoke or have quit within the past 15 years.  Fecal occult blood test (FOBT) of the stool. You may have this test every year starting at age 57.  Flexible sigmoidoscopy or colonoscopy.  You may have a sigmoidoscopy every 5 years or a colonoscopy every 10 years starting at age 65.  Hepatitis C blood test.  Hepatitis B blood test.  Sexually transmitted disease (STD) testing.  Diabetes screening. This is done by checking your blood sugar (glucose) after you have not eaten for a while (fasting). You may have this done  every 1-3 years.  Bone density scan. This is done to screen for osteoporosis. You may have this done starting at age 1.  Mammogram. This may be done every 1-2 years. Talk to your health care provider about how often you should have regular mammograms. Talk with your health care provider about your test results, treatment options, and if necessary, the need for more tests. Vaccines  Your health care provider may recommend certain vaccines, such as:  Influenza vaccine. This is recommended every year.  Tetanus, diphtheria, and acellular pertussis (Tdap, Td) vaccine. You may need a Td booster every 10 years.  Zoster vaccine. You may need this after age 39.  Pneumococcal 13-valent conjugate (PCV13) vaccine. One dose is recommended after age 60.  Pneumococcal polysaccharide (PPSV23) vaccine. One dose is recommended after age 44. Talk to your health care provider about which screenings and vaccines you need and how often you need them. This information is not intended to replace advice given to you by your health care provider. Make sure you discuss any questions you have with your health care provider. Document Released: 04/14/2015 Document Revised: 12/06/2015 Document Reviewed: 01/17/2015 Elsevier Interactive Patient Education  2017 [employer] Prevention in the Home Falls can cause injuries. They can happen to people of all ages. There are many things you can do to make your home safe and to help prevent falls. What can I do on the outside of my home?  Regularly fix the edges of walkways and driveways and fix any cracks.  Remove anything that might make you trip as you walk through a door, such as a raised step or threshold.  Trim any bushes or trees on the path to your home.  Use bright outdoor lighting.  Clear any walking paths of anything that might make someone trip, such as rocks or tools.  Regularly check to see if handrails are loose or broken. Make sure that both  sides of any steps have handrails.  Any raised decks and porches should have guardrails on the edges.  Have any leaves, snow, or ice cleared regularly.  Use sand or salt on walking paths during winter.  Clean up any spills in your garage right away. This includes oil or grease spills. What can I do in the bathroom?  Use night lights.  Install grab bars by the toilet and in the tub and shower. Do not use towel bars as grab bars.  Use non-skid mats or decals in the tub or shower.  If you need to sit down in the shower, use a plastic, non-slip stool.  Keep the floor dry. Clean up any water that spills on the floor as soon as it happens.  Remove soap buildup in the tub or shower regularly.  Attach bath mats securely with double-sided non-slip rug tape.  Do not have throw rugs and other things on the floor that can make you trip. What can I do in the bedroom?  Use night lights.  Make sure that you have a light by your bed that is easy to reach.  Do not use any sheets or blankets that are too big  for your bed. They should not hang down onto the floor.  Have a firm chair that has side arms. You can use this for support while you get dressed.  Do not have throw rugs and other things on the floor that can make you trip. What can I do in the kitchen?  Clean up any spills right away.  Avoid walking on wet floors.  Keep items that you use a lot in easy-to-reach places.  If you need to reach something above you, use a strong step stool that has a grab bar.  Keep electrical cords out of the way.  Do not use floor polish or wax that makes floors slippery. If you must use wax, use non-skid floor wax.  Do not have throw rugs and other things on the floor that can make you trip. What can I do with my stairs?  Do not leave any items on the stairs.  Make sure that there are handrails on both sides of the stairs and use them. Fix handrails that are broken or loose. Make sure that  handrails are as long as the stairways.  Check any carpeting to make sure that it is firmly attached to the stairs. Fix any carpet that is loose or worn.  Avoid having throw rugs at the top or bottom of the stairs. If you do have throw rugs, attach them to the floor with carpet tape.  Make sure that you have a light switch at the top of the stairs and the bottom of the stairs. If you do not have them, ask someone to add them for you. What else can I do to help prevent falls?  Wear shoes that:  Do not have high heels.  Have rubber bottoms.  Are comfortable and fit you well.  Are closed at the toe. Do not wear sandals.  If you use a stepladder:  Make sure that it is fully opened. Do not climb a closed stepladder.  Make sure that both sides of the stepladder are locked into place.  Ask someone to hold it for you, if possible.  Clearly mark and make sure that you can see:  Any grab bars or handrails.  First and last steps.  Where the edge of each step is.  Use tools that help you move around (mobility aids) if they are needed. These include:  Canes.  Walkers.  Scooters.  Crutches.  Turn on the lights when you go into a dark area. Replace any light bulbs as soon as they burn out.  Set up your furniture so you have a clear path. Avoid moving your furniture around.  If any of your floors are uneven, fix them.  If there are any pets around you, be aware of where they are.  Review your medicines with your doctor. Some medicines can make you feel dizzy. This can increase your chance of falling. Ask your doctor what other things that you can do to help prevent falls. This information is not intended to replace advice given to you by your health care provider. Make sure you discuss any questions you have with your health care provider. Document Released: 01/12/2009 Document Revised: 08/24/2015 Document Reviewed: 04/22/2014 Elsevier Interactive Patient Education  2017  [employer].

## 2020-03-03 ENCOUNTER — Ambulatory Visit (INDEPENDENT_AMBULATORY_CARE_PROVIDER_SITE_OTHER): Payer: Medicare Other | Admitting: Internal Medicine

## 2020-03-03 ENCOUNTER — Encounter: Payer: Self-pay | Admitting: Internal Medicine

## 2020-03-03 ENCOUNTER — Other Ambulatory Visit: Payer: Self-pay

## 2020-03-03 VITALS — BP 144/72 | HR 86 | Temp 98.1°F | Resp 15 | Ht 61.75 in | Wt 130.0 lb

## 2020-03-03 DIAGNOSIS — E559 Vitamin D deficiency, unspecified: Secondary | ICD-10-CM | POA: Diagnosis not present

## 2020-03-03 DIAGNOSIS — F418 Other specified anxiety disorders: Secondary | ICD-10-CM

## 2020-03-03 DIAGNOSIS — E871 Hypo-osmolality and hyponatremia: Secondary | ICD-10-CM

## 2020-03-03 DIAGNOSIS — E039 Hypothyroidism, unspecified: Secondary | ICD-10-CM | POA: Diagnosis not present

## 2020-03-03 DIAGNOSIS — I10 Essential (primary) hypertension: Secondary | ICD-10-CM

## 2020-03-03 DIAGNOSIS — F5101 Primary insomnia: Secondary | ICD-10-CM

## 2020-03-03 LAB — COMPREHENSIVE METABOLIC PANEL
ALT: 16 U/L (ref 0–35)
AST: 27 U/L (ref 0–37)
Albumin: 4.3 g/dL (ref 3.5–5.2)
Alkaline Phosphatase: 58 U/L (ref 39–117)
BUN: 13 mg/dL (ref 6–23)
CO2: 26 mEq/L (ref 19–32)
Calcium: 9.6 mg/dL (ref 8.4–10.5)
Chloride: 93 mEq/L — ABNORMAL LOW (ref 96–112)
Creatinine, Ser: 0.86 mg/dL (ref 0.40–1.20)
GFR: 63.7 mL/min (ref >60.00)
Glucose, Bld: 72 mg/dL (ref 70–99)
Potassium: 4.1 mEq/L (ref 3.5–5.1)
Sodium: 129 mEq/L — ABNORMAL LOW (ref 135–145)
Total Bilirubin: 0.6 mg/dL (ref 0.2–1.2)
Total Protein: 8.1 g/dL (ref 6.0–8.3)

## 2020-03-03 LAB — LIPID PANEL
Cholesterol: 152 mg/dL (ref 0–200)
HDL: 58.2 mg/dL (ref >39.00)
LDL Cholesterol: 82 mg/dL (ref 0–99)
NonHDL: 94.13
Total CHOL/HDL Ratio: 3
Triglycerides: 62 mg/dL (ref 0.0–149.0)
VLDL: 12.4 mg/dL (ref 0.0–40.0)

## 2020-03-03 LAB — VITAMIN D 25 HYDROXY (VIT D DEFICIENCY, FRACTURES): VITD: 41.29 ng/mL (ref 30.00–100.00)

## 2020-03-03 LAB — TSH: TSH: 2.92 u[IU]/mL (ref 0.35–4.50)

## 2020-03-03 MED ORDER — SERTRALINE HCL 50 MG PO TABS: 50.0000 mg | ORAL_TABLET | Freq: Every day | ORAL | 3 refills | Status: DC

## 2020-03-03 NOTE — Patient Instructions (Addendum)
  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  You can increase to a full tablet after 1 week if you havenot developed side effects of nausea.    You should start to feel  Improvement after two weeks on the full dose.  If you are not tolerating it,  You can stop it  Without a need to taper off  Let me know how you are feeling in a month  Or so

## 2020-03-03 NOTE — Progress Notes (Signed)
Subjective:  Patient ID: Holly Perez, female    DOB: February 13, 1941  Age: 79 y.o. MRN: [id number]  CC: The primary encounter diagnosis was Vitamin D deficiency. Diagnoses of White coat syndrome with hypertension, Chronic hyponatremia, Acquired hypothyroidism, Mixed anxiety and depressive disorder, and Primary insomnia were also pertinent to this visit.  HPI Holly Perez presents for 6 month follow up  This visit occurred during the SARS-CoV-2 public health emergency.  Safety protocols were in place, including screening questions prior to the visit, additional usage of staff PPE, and extensive cleaning of exam room while observing appropriate contact time as indicated for disinfecting solutions.  Feels depressed due to holiday.  Happens every year .  She is lonely,  Mother died at 68 so she has no warm holiday memories.  No children either.   Using the alprazolam once daily to help her sleep  gos to gym 3/week or mall walks for 2 hours ,   Had PT for back     Outpatient Medications Prior to Visit  Medication Sig Dispense Refill  . albuterol (PROVENTIL HFA;VENTOLIN HFA) 108 (90 BASE) MCG/ACT inhaler Inhale into the lungs every 6 (six) hours as needed for wheezing or shortness of breath.    . ALPRAZolam (XANAX) 0.25 MG tablet Take 1 tablet (0.25 mg total) by mouth 2 (two) times daily as needed. 60 tablet 5  . amLODipine (NORVASC) 10 MG tablet TAKE 1 TABLET(10 MG) BY MOUTH DAILY 90 tablet 1  . aspirin 81 MG tablet Take 81 mg by mouth daily.    . Calcium Carbonate-Vitamin D (CALTRATE 600+D PO) Take 1,200 mg by mouth daily.     . cholecalciferol (VITAMIN D) 1000 UNITS tablet Take 2,000 Units by mouth daily.    . fish oil-omega-3 fatty acids 1000 MG capsule Take 1 g by mouth daily.     . Fluticasone-Salmeterol (ADVAIR) 250-50 MCG/DOSE AEPB Inhale 1 puff into the lungs every 12 (twelve) hours.    Marland Kitchen levothyroxine (SYNTHROID) 50 MCG tablet Take 1 tablet (50 mcg total) by mouth daily before  breakfast. 90 tablet 1  . losartan (COZAAR) 100 MG tablet TAKE 1 TABLET(100 MG) BY MOUTH DAILY 90 tablet 1  . Multiple Vitamin (MULTIVITAMIN) tablet Take 1 tablet by mouth daily.    . traMADol (ULTRAM) 50 MG tablet Take 1 tablet (50 mg total) by mouth 2 (two) times daily. For moderate pain 60 tablet 5  . vitamin E 100 UNIT capsule Take 100 Units by mouth daily.     No facility-administered medications prior to visit.    Review of Systems;  Patient denies headache, fevers, malaise, unintentional weight loss, skin rash, eye pain, sinus congestion and sinus pain, sore throat, dysphagia,  hemoptysis , cough, dyspnea, wheezing, chest pain, palpitations, orthopnea, edema, abdominal pain, nausea, melena, diarrhea, constipation, flank pain, dysuria, hematuria, urinary  Frequency, nocturia, numbness, tingling, seizures,  Focal weakness, Loss of consciousness,  Tremor, insomnia, depression, anxiety, and suicidal ideation.      Objective:  BP (!) 144/72 (BP Location: Left Arm, Patient Position: Sitting, Cuff Size: Normal)   Pulse 86   Temp 98.1 F (36.7 C) (Oral)   Resp 15   Ht 5' 1.75" (1.568 m)   Wt 130 lb (59 kg)   SpO2 99%   BMI 23.97 kg/m   BP Readings from Last 3 Encounters:  03/03/20 (!) 144/72  10/20/19 134/79  09/02/19 (!) 156/78    Wt Readings from Last 3 Encounters:  03/03/20 130 lb (59  kg)  02/15/20 131 lb (59.4 kg)  09/02/19 131 lb 9.6 oz (59.7 kg)    General appearance: alert, cooperative and appears stated age Ears: normal TM's and external ear canals both ears Throat: lips, mucosa, and tongue normal; teeth and gums normal Neck: no adenopathy, no carotid bruit, supple, symmetrical, trachea midline and thyroid not enlarged, symmetric, no tenderness/mass/nodules Back: symmetric, no curvature. ROM normal. No CVA tenderness. Lungs: clear to auscultation bilaterally Heart: regular rate and rhythm, S1, S2 normal, no murmur, click, rub or gallop Abdomen: soft, non-tender;  bowel sounds normal; no masses,  no organomegaly Pulses: 2+ and symmetric Skin: Skin color, texture, turgor normal. No rashes or lesions Lymph nodes: Cervical, supraclavicular, and axillary nodes normal.  No results found for: HGBA1C  Lab Results  Component Value Date   CREATININE 0.86 03/03/2020   CREATININE 0.77 09/13/2019   CREATININE 0.81 09/02/2019    Lab Results  Component Value Date   WBC 4.9 12/01/2014   HGB 12.1 12/01/2014   HCT 36.5 12/01/2014   PLT 366.0 12/01/2014   GLUCOSE 72 03/03/2020   CHOL 152 03/03/2020   TRIG 62.0 03/03/2020   HDL 58.20 03/03/2020   LDLCALC 82 03/03/2020   ALT 16 03/03/2020   AST 27 03/03/2020   NA 129 (L) 03/03/2020   K 4.1 03/03/2020   CL 93 (L) 03/03/2020   CREATININE 0.86 03/03/2020   BUN 13 03/03/2020   CO2 26 03/03/2020   TSH 2.92 03/03/2020   MICROALBUR 1.6 02/17/2019    No results found.  Assessment & Plan:   Problem List Items Addressed This Visit      Unprioritized   White coat syndrome with hypertension    Home readings on a calibrated machine have been consistently < 140/80.  No changes to regimen today       Relevant Orders   Lipid panel (Completed)   Mixed anxiety and depressive disorder    Starting sertraline for management of symptoms aggravated by the holidays every year and loneliness       Relevant Medications   sertraline (ZOLOFT) 50 MG tablet   Insomnia    Managed with low stable dose of alprazolam.  The risks and benefits of benzodiazepine use were reviewed with patient today including excessive sedation leading to respiratory depression,  impaired thinking/driving, memory loss and addiction.  Patient was advised to avoid concurrent use with alcohol, to use medication only as needed and not to share with others  .       Chronic hyponatremia   Relevant Orders   Comprehensive metabolic panel (Completed)   Acquired hypothyroidism    Thyroid function is WNL on current dose of 50 mcg levothyroxine.   No current changes needed.   Lab Results  Component Value Date   TSH 2.92 03/03/2020         Relevant Orders   TSH (Completed)    Other Visit Diagnoses    Vitamin D deficiency    -  Primary   Relevant Orders   VITAMIN D 25 Hydroxy (Vit-D Deficiency, Fractures) (Completed)      I am having Holly Perez start on sertraline. I am also having her maintain her aspirin, Fluticasone-Salmeterol, cholecalciferol, Calcium Carbonate-Vitamin D (CALTRATE 600+D PO), multivitamin, vitamin E, fish oil-omega-3 fatty acids, albuterol, losartan, ALPRAZolam, traMADol, amLODipine, and levothyroxine.  Meds ordered this encounter  Medications  . sertraline (ZOLOFT) 50 MG tablet    Sig: Take 1 tablet (50 mg total) by mouth daily.  Dispense:  30 tablet    Refill:  3    There are no discontinued medications.  Follow-up: Return in about 6 months (around 09/01/2020).   Crecencio Mc, MD

## 2020-03-03 NOTE — Assessment & Plan Note (Signed)
Home readings on a calibrated machine have been consistently < 140/80.  No changes to regimen today

## 2020-03-04 DIAGNOSIS — F418 Other specified anxiety disorders: Secondary | ICD-10-CM | POA: Insufficient documentation

## 2020-03-04 NOTE — Assessment & Plan Note (Signed)
Managed with low stable dose of alprazolam.  The risks and benefits of benzodiazepine use were reviewed with patient today including excessive sedation leading to respiratory depression,  impaired thinking/driving, memory loss and addiction.  Patient was advised to avoid concurrent use with alcohol, to use medication only as needed and not to share with others  .

## 2020-03-04 NOTE — Assessment & Plan Note (Addendum)
Thyroid function is WNL on current dose of 50 mcg levothyroxine.  No current changes needed.   Lab Results  Component Value Date   TSH 2.92 03/03/2020

## 2020-03-04 NOTE — Assessment & Plan Note (Signed)
Starting sertraline for management of symptoms aggravated by the holidays every year and loneliness

## 2020-03-06 ENCOUNTER — Other Ambulatory Visit: Payer: Self-pay | Admitting: Internal Medicine

## 2020-03-06 ENCOUNTER — Other Ambulatory Visit: Payer: Self-pay

## 2020-03-06 DIAGNOSIS — E871 Hypo-osmolality and hyponatremia: Secondary | ICD-10-CM

## 2020-03-06 MED ORDER — LOSARTAN POTASSIUM 100 MG PO TABS
ORAL_TABLET | ORAL | 1 refills | Status: DC
Start: 2020-03-06 — End: 2020-09-07

## 2020-03-06 NOTE — Progress Notes (Signed)
Labs are normal,  sodium low but stable/unchanged . HOwever we will need to recheck it if she tolerates the zoloft we started,  in about a month.  O fasting requried

## 2020-03-21 ENCOUNTER — Other Ambulatory Visit: Payer: Self-pay | Admitting: Internal Medicine

## 2020-03-21 NOTE — Telephone Encounter (Signed)
Last refilled:01/22/2020 Last OV: 03/03/2020 Next OV: 09/07/2020  Pt also stated that since she started taking the losartan she has been getting dizzy and just not feeling herself. Pt wants to know if she can take half a tablet and see how that makes her feel.

## 2020-03-21 NOTE — Telephone Encounter (Signed)
Patient needs the following traMADol (ULTRAM) 50 MG tablet. Patient is requesting a phone call from [facility] about her medication. FYI: patient is out of her tramadol

## 2020-03-22 MED ORDER — TRAMADOL HCL 50 MG PO TABS: 50.0000 mg | ORAL_TABLET | Freq: Two times a day (BID) | ORAL | 5 refills | Status: DC

## 2020-03-22 NOTE — Telephone Encounter (Signed)
Spoke with pt and advised her that she can reduce the dose to half and that she needs to start keeping a track of her bp readings.

## 2020-03-22 NOTE — Telephone Encounter (Signed)
Yes, she can reduce the dose,  But she has been taking the losartan for over six months. Has she been checking her blood pressure?

## 2020-03-30 DIAGNOSIS — Z1152 Encounter for screening for COVID-19: Secondary | ICD-10-CM | POA: Diagnosis not present

## 2020-03-30 DIAGNOSIS — Z03818 Encounter for observation for suspected exposure to other biological agents ruled out: Secondary | ICD-10-CM | POA: Diagnosis not present

## 2020-04-12 ENCOUNTER — Telehealth: Payer: Self-pay | Admitting: Internal Medicine

## 2020-04-12 MED ORDER — ALPRAZOLAM 0.25 MG PO TABS
0.2500 mg | ORAL_TABLET | Freq: Two times a day (BID) | ORAL | 5 refills | Status: DC | PRN
Start: 2020-04-12 — End: 2020-09-07

## 2020-04-12 NOTE — Telephone Encounter (Signed)
Refilled for 6 months  

## 2020-04-12 NOTE — Telephone Encounter (Signed)
Pt needs refill on ALPRAZolam (XANAX) 0.25 MG tablet sent to [facility]

## 2020-04-12 NOTE — Telephone Encounter (Signed)
LOV- 03/03/20 Next OV - 09/05/20 Last refill sent - 09/02/19

## 2020-04-12 NOTE — Addendum Note (Signed)
Addended by: Kharis Lapenna L on: 01/31/2021 01:13 PM   Modules accepted: Orders  

## 2020-04-13 ENCOUNTER — Other Ambulatory Visit (INDEPENDENT_AMBULATORY_CARE_PROVIDER_SITE_OTHER): Payer: Medicare Other

## 2020-04-13 ENCOUNTER — Other Ambulatory Visit: Payer: Self-pay

## 2020-04-13 DIAGNOSIS — E871 Hypo-osmolality and hyponatremia: Secondary | ICD-10-CM | POA: Diagnosis not present

## 2020-04-13 LAB — BASIC METABOLIC PANEL
BUN: 9 mg/dL (ref 6–23)
CO2: 29 mEq/L (ref 19–32)
Calcium: 9.7 mg/dL (ref 8.4–10.5)
Chloride: 92 mEq/L — ABNORMAL LOW (ref 96–112)
Creatinine, Ser: 0.77 mg/dL (ref 0.40–1.20)
GFR: 72.68 mL/min (ref >60.00)
Glucose, Bld: 64 mg/dL — ABNORMAL LOW (ref 70–99)
Potassium: 4.9 mEq/L (ref 3.5–5.1)
Sodium: 125 mEq/L — ABNORMAL LOW (ref 135–145)

## 2020-04-17 ENCOUNTER — Telehealth: Payer: Self-pay | Admitting: *Deleted

## 2020-04-17 DIAGNOSIS — E871 Hypo-osmolality and hyponatremia: Secondary | ICD-10-CM

## 2020-04-17 NOTE — Telephone Encounter (Signed)
Left message to return call to  office after 1 tomorrow. 

## 2020-04-17 NOTE — Telephone Encounter (Signed)
-----   Message from Crecencio Mc, MD sent at 04/16/2020  7:56 PM EST ----- Your sodium level is lower than usual.  I'm still not sure what is causing it. Would like you to see an endocrinologist  to determine if there is a condition we need to treat with anything more than salt tablets.  If you are agreeable I will make the referral   Regards,   Deborra Medina, MD

## 2020-04-18 ENCOUNTER — Telehealth: Payer: Self-pay | Admitting: Internal Medicine

## 2020-04-21 ENCOUNTER — Other Ambulatory Visit: Payer: Self-pay | Admitting: Internal Medicine

## 2020-04-21 DIAGNOSIS — Z1231 Encounter for screening mammogram for malignant neoplasm of breast: Secondary | ICD-10-CM

## 2020-04-26 ENCOUNTER — Other Ambulatory Visit: Payer: Self-pay | Admitting: Internal Medicine

## 2020-05-03 NOTE — Telephone Encounter (Signed)
Pt came into the office to follow up on Endo referral  Pt would like to go somewhere here local in town

## 2020-05-03 NOTE — Addendum Note (Signed)
Addended by: Crecencio Mc on: 05/03/2020 03:12 PM   Modules accepted: Orders

## 2020-05-09 ENCOUNTER — Other Ambulatory Visit: Payer: Self-pay

## 2020-05-09 ENCOUNTER — Ambulatory Visit
Admission: RE | Admit: 2020-05-09 | Discharge: 2020-05-09 | Disposition: A | Discharge status: Home / Self Care | Payer: Medicare Other | Source: Ambulatory Visit | Attending: Internal Medicine | Admitting: Internal Medicine

## 2020-05-09 DIAGNOSIS — Z1231 Encounter for screening mammogram for malignant neoplasm of breast: Secondary | ICD-10-CM | POA: Diagnosis not present

## 2020-05-17 DIAGNOSIS — E871 Hypo-osmolality and hyponatremia: Secondary | ICD-10-CM | POA: Diagnosis not present

## 2020-05-17 DIAGNOSIS — I10 Essential (primary) hypertension: Secondary | ICD-10-CM | POA: Diagnosis not present

## 2020-05-23 ENCOUNTER — Other Ambulatory Visit: Payer: Self-pay

## 2020-05-23 MED ORDER — AMLODIPINE BESYLATE 10 MG PO TABS: ORAL_TABLET | ORAL | 1 refills | Status: DC

## 2020-07-12 ENCOUNTER — Telehealth: Payer: Self-pay | Admitting: Internal Medicine

## 2020-07-12 DIAGNOSIS — I10 Essential (primary) hypertension: Secondary | ICD-10-CM

## 2020-07-12 DIAGNOSIS — E039 Hypothyroidism, unspecified: Secondary | ICD-10-CM

## 2020-07-12 DIAGNOSIS — E785 Hyperlipidemia, unspecified: Secondary | ICD-10-CM

## 2020-07-12 NOTE — Telephone Encounter (Signed)
Pt called and would like to have labs done before her next appt

## 2020-07-13 NOTE — Addendum Note (Signed)
Addended by: Adair Laundry on: 07/13/2020 04:06 PM   Modules accepted: Orders

## 2020-07-13 NOTE — Telephone Encounter (Signed)
Pt would like to have lab done prior to her appt on 09/07/2020. I have ordered TSH, Lipid panel, cmp and microalbumin. Is there anything else that needs to be ordered?

## 2020-08-15 DIAGNOSIS — I10 Essential (primary) hypertension: Secondary | ICD-10-CM | POA: Diagnosis not present

## 2020-08-15 DIAGNOSIS — E871 Hypo-osmolality and hyponatremia: Secondary | ICD-10-CM | POA: Diagnosis not present

## 2020-09-04 ENCOUNTER — Ambulatory Visit: Payer: Medicare Other | Admitting: Internal Medicine

## 2020-09-05 ENCOUNTER — Other Ambulatory Visit (INDEPENDENT_AMBULATORY_CARE_PROVIDER_SITE_OTHER): Payer: Medicare Other

## 2020-09-05 ENCOUNTER — Other Ambulatory Visit: Payer: Self-pay

## 2020-09-05 DIAGNOSIS — E785 Hyperlipidemia, unspecified: Secondary | ICD-10-CM | POA: Diagnosis not present

## 2020-09-05 DIAGNOSIS — I10 Essential (primary) hypertension: Secondary | ICD-10-CM | POA: Diagnosis not present

## 2020-09-05 DIAGNOSIS — E039 Hypothyroidism, unspecified: Secondary | ICD-10-CM

## 2020-09-05 LAB — LIPID PANEL
Cholesterol: 156 mg/dL (ref 0–200)
HDL: 65.9 mg/dL (ref >39.00)
LDL Cholesterol: 79 mg/dL (ref 0–99)
NonHDL: 89.9
Total CHOL/HDL Ratio: 2
Triglycerides: 57 mg/dL (ref 0.0–149.0)
VLDL: 11.4 mg/dL (ref 0.0–40.0)

## 2020-09-05 LAB — COMPREHENSIVE METABOLIC PANEL
ALT: 20 U/L (ref 0–35)
AST: 30 U/L (ref 0–37)
Albumin: 4.3 g/dL (ref 3.5–5.2)
Alkaline Phosphatase: 66 U/L (ref 39–117)
BUN: 14 mg/dL (ref 6–23)
CO2: 26 mEq/L (ref 19–32)
Calcium: 9.6 mg/dL (ref 8.4–10.5)
Chloride: 95 mEq/L — ABNORMAL LOW (ref 96–112)
Creatinine, Ser: 0.87 mg/dL (ref 0.40–1.20)
GFR: 62.6 mL/min (ref >60.00)
Glucose, Bld: 77 mg/dL (ref 70–99)
Potassium: 4.4 mEq/L (ref 3.5–5.1)
Sodium: 131 mEq/L — ABNORMAL LOW (ref 135–145)
Total Bilirubin: 0.5 mg/dL (ref 0.2–1.2)
Total Protein: 8 g/dL (ref 6.0–8.3)

## 2020-09-05 LAB — TSH: TSH: 3.34 u[IU]/mL (ref 0.35–4.50)

## 2020-09-06 LAB — MICROALBUMIN / CREATININE URINE RATIO
Creatinine,U: 111.1 mg/dL
Microalb Creat Ratio: 5.2 mg/g (ref 0.0–30.0)
Microalb, Ur: 5.8 mg/dL — ABNORMAL HIGH (ref 0.0–1.9)

## 2020-09-07 ENCOUNTER — Encounter: Payer: Self-pay | Admitting: Internal Medicine

## 2020-09-07 ENCOUNTER — Ambulatory Visit (INDEPENDENT_AMBULATORY_CARE_PROVIDER_SITE_OTHER): Payer: Medicare Other | Admitting: Internal Medicine

## 2020-09-07 ENCOUNTER — Other Ambulatory Visit: Payer: Self-pay

## 2020-09-07 DIAGNOSIS — M1732 Unilateral post-traumatic osteoarthritis, left knee: Secondary | ICD-10-CM | POA: Diagnosis not present

## 2020-09-07 DIAGNOSIS — I10 Essential (primary) hypertension: Secondary | ICD-10-CM

## 2020-09-07 DIAGNOSIS — E039 Hypothyroidism, unspecified: Secondary | ICD-10-CM | POA: Diagnosis not present

## 2020-09-07 DIAGNOSIS — Z23 Encounter for immunization: Secondary | ICD-10-CM | POA: Diagnosis not present

## 2020-09-07 DIAGNOSIS — F418 Other specified anxiety disorders: Secondary | ICD-10-CM | POA: Diagnosis not present

## 2020-09-07 DIAGNOSIS — E871 Hypo-osmolality and hyponatremia: Secondary | ICD-10-CM | POA: Diagnosis not present

## 2020-09-07 MED ORDER — ALPRAZOLAM 0.25 MG PO TABS: 0.2500 mg | ORAL_TABLET | Freq: Every evening | ORAL | 5 refills | Status: DC | PRN

## 2020-09-07 MED ORDER — LOSARTAN POTASSIUM 50 MG PO TABS: 50.0000 mg | ORAL_TABLET | Freq: Every day | ORAL | 1 refills | Status: DC

## 2020-09-07 MED ORDER — TRAMADOL HCL 50 MG PO TABS: 50.0000 mg | ORAL_TABLET | Freq: Two times a day (BID) | ORAL | 5 refills | Status: DC

## 2020-09-07 NOTE — Patient Instructions (Signed)
Your Cholesterol , thyroid , liver and kidney function are excellent!   You do not need any medication changes .  Your sodium level has improved.  Continue to hydrate with gatorade   I have reduced your losartan tablet to 50 mg so you will not need to cut it in half anymore   I'll see you in 6 months

## 2020-09-07 NOTE — Progress Notes (Signed)
Subjective:  Patient ID: Holly Perez, female    DOB: 1940-11-11  Age: 80 y.o. MRN: [id number]  CC: Diagnoses of Acquired hypothyroidism, Chronic hyponatremia, Mixed anxiety and depressive disorder, Post-traumatic osteoarthritis of left knee, and White coat syndrome with hypertension were pertinent to this visit.  HPI Holly Perez presents for follow up on hypertension,  GAD and hyponatremia, chronic   This visit occurred during the SARS-CoV-2 public health emergency.  Safety protocols were in place, including screening questions prior to the visit, additional usage of staff PPE, and extensive cleaning of exam room while observing appropriate contact time as indicated for disinfecting solutions.   Positive screen for depression and anxiety.  Refuses sertraline , feels is did not help. Prefers to continue alprazolam , Using one tablet daily in the evening . Anxiety triggered by the needs and wants of others who call upon her for advice and help. Discussed a faith based approach to her anxiety, which is what she prefers  HTN: taking 1/2 tablet of losartan and amlodipine both in the morning.  Home readings of BP < 140/80  Chronic back pain:  using tramadol as needed,  not more than 2 daily   Outpatient Medications Prior to Visit  Medication Sig Dispense Refill   albuterol (PROVENTIL HFA;VENTOLIN HFA) 108 (90 BASE) MCG/ACT inhaler Inhale into the lungs every 6 (six) hours as needed for wheezing or shortness of breath.     amLODipine (NORVASC) 10 MG tablet TAKE 1 TABLET(10 MG) BY MOUTH DAILY 90 tablet 1   aspirin 81 MG tablet Take 81 mg by mouth daily.     Calcium Carbonate-Vitamin D (CALTRATE 600+D PO) Take 1,200 mg by mouth daily.      cholecalciferol (VITAMIN D) 1000 UNITS tablet Take 2,000 Units by mouth daily.     fish oil-omega-3 fatty acids 1000 MG capsule Take 1 g by mouth daily.      Fluticasone-Salmeterol (ADVAIR) 250-50 MCG/DOSE AEPB Inhale 1 puff into the lungs every 12 (twelve)  hours.     levothyroxine (SYNTHROID) 50 MCG tablet TAKE 1 TABLET(50 MCG) BY MOUTH DAILY BEFORE BREAKFAST 90 tablet 1   Multiple Vitamin (MULTIVITAMIN) tablet Take 1 tablet by mouth daily.     vitamin E 100 UNIT capsule Take 100 Units by mouth daily.     ALPRAZolam (XANAX) 0.25 MG tablet Take 1 tablet (0.25 mg total) by mouth 2 (two) times daily as needed. 60 tablet 5   losartan (COZAAR) 100 MG tablet TAKE 1 TABLET(100 MG) BY MOUTH DAILY 90 tablet 1   traMADol (ULTRAM) 50 MG tablet Take 1 tablet (50 mg total) by mouth 2 (two) times daily. For moderate pain 60 tablet 5   sertraline (ZOLOFT) 50 MG tablet Take 1 tablet (50 mg total) by mouth daily. (Patient not taking: Reported on 09/07/2020) 30 tablet 3   No facility-administered medications prior to visit.    Review of Systems;  Patient denies headache, fevers, malaise, unintentional weight loss, skin rash, eye pain, sinus congestion and sinus pain, sore throat, dysphagia,  hemoptysis , cough, dyspnea, wheezing, chest pain, palpitations, orthopnea, edema, abdominal pain, nausea, melena, diarrhea, constipation, flank pain, dysuria, hematuria, urinary  Frequency, nocturia, numbness, tingling, seizures,  Focal weakness, Loss of consciousness,  Tremor, insomnia, depression, anxiety, and suicidal ideation.      Objective:  BP 138/78 (BP Location: Left Arm, Patient Position: Sitting, Cuff Size: Normal)   Pulse 82   Temp (!) 97.5 F (36.4 C) (Temporal)   Resp 16  Ht 5' 1.75" (1.568 m)   Wt 127 lb 3.2 oz (57.7 kg)   SpO2 99%   BMI 23.45 kg/m   BP Readings from Last 3 Encounters:  09/07/20 138/78  03/03/20 (!) 144/72  10/20/19 134/79    Wt Readings from Last 3 Encounters:  09/07/20 127 lb 3.2 oz (57.7 kg)  03/03/20 130 lb (59 kg)  02/15/20 131 lb (59.4 kg)    General appearance: alert, cooperative and appears stated age Ears: normal TM's and external ear canals both ears Throat: lips, mucosa, and tongue normal; teeth and gums  normal Neck: no adenopathy, no carotid bruit, supple, symmetrical, trachea midline and thyroid not enlarged, symmetric, no tenderness/mass/nodules Back: symmetric, no curvature. ROM normal. No CVA tenderness. Lungs: clear to auscultation bilaterally Heart: regular rate and rhythm, S1, S2 normal, no murmur, click, rub or gallop Abdomen: soft, non-tender; bowel sounds normal; no masses,  no organomegaly Pulses: 2+ and symmetric Skin: Skin color, texture, turgor normal. No rashes or lesions Lymph nodes: Cervical, supraclavicular, and axillary nodes normal.  No results found for: HGBA1C  Lab Results  Component Value Date   CREATININE 0.87 09/05/2020   CREATININE 0.77 04/13/2020   CREATININE 0.86 03/03/2020    Lab Results  Component Value Date   WBC 4.9 12/01/2014   HGB 12.1 12/01/2014   HCT 36.5 12/01/2014   PLT 366.0 12/01/2014   GLUCOSE 77 09/05/2020   CHOL 156 09/05/2020   TRIG 57.0 09/05/2020   HDL 65.90 09/05/2020   LDLCALC 79 09/05/2020   ALT 20 09/05/2020   AST 30 09/05/2020   NA 131 (L) 09/05/2020   K 4.4 09/05/2020   CL 95 (L) 09/05/2020   CREATININE 0.87 09/05/2020   BUN 14 09/05/2020   CO2 26 09/05/2020   TSH 3.34 09/05/2020   MICROALBUR 5.8 (H) 09/05/2020    MM 3D SCREEN BREAST BILATERAL  Result Date: 05/11/2020 CLINICAL DATA:  Screening. EXAM: DIGITAL SCREENING BILATERAL MAMMOGRAM WITH TOMOSYNTHESIS AND CAD TECHNIQUE: Bilateral screening digital craniocaudal and mediolateral oblique mammograms were obtained. Bilateral screening digital breast tomosynthesis was performed. The images were evaluated with computer-aided detection. COMPARISON:  Previous exam(s). ACR Breast Density Category b: There are scattered areas of fibroglandular density. FINDINGS: There are no findings suspicious for malignancy. IMPRESSION: No mammographic evidence of malignancy. A result letter of this screening mammogram will be mailed directly to the patient. RECOMMENDATION: Screening  mammogram in one year. (Code:SM-B-01Y) BI-RADS CATEGORY  1: Negative. Electronically Signed   By: Margarette Canada M.D.   On: 05/11/2020 15:21    Assessment & Plan:   Problem List Items Addressed This Visit       Unprioritized   Acquired hypothyroidism    Thyroid function is WNL on current dose of 50 mcg daily.  No current changes needed.    Lab Results  Component Value Date   TSH 3.34 09/05/2020          DJD (degenerative joint disease) of knee    Her chroni pain is managed with tylenol and tramadol in stable doses.  Refill history confirmed via [place] Controlled Substance databas, accessed by me today..       Relevant Medications   traMADol (ULTRAM) 50 MG tablet   White coat syndrome with hypertension    Home readings on a calibrated machine have been consistently < 140/80 on 1/2 tablet (50 mg ) losartan and 10 mg amlodipine.    No changes to regimen   Lab Results  Component Value Date  CREATININE 0.87 09/05/2020   Lab Results  Component Value Date   NA 131 (L) 09/05/2020   K 4.4 09/05/2020   CL 95 (L) 09/05/2020   CO2 26 09/05/2020          Relevant Medications   losartan (COZAAR) 50 MG tablet   Chronic hyponatremia    Stable.  Reminded to liberalize salt in diet and maintain good hydration with 60 ounces of water daily   Lab Results  Component Value Date   NA 131 (L) 09/05/2020   K 4.4 09/05/2020   CL 95 (L) 09/05/2020   CO2 26 09/05/2020          Mixed anxiety and depressive disorder    She apprehensively started sertraline for management of symptomsand stateed that it did not work and has no interest in a retiral at higher dose , or an alternative.  She is using alprazolam responsibly and has not escalated use.  A faith based approach was used to allow patient to explore and manage her feelings and reactions.  The risks and benefits of benzodiazepine use were reviewed  with patient today including excessive sedation leading to respiratory depression,   impaired thinking/driving, and addiction.  Patient was advised to avoid concurrent use with alcohol, to use medication only as needed and not to share with others  .        Relevant Medications   ALPRAZolam (XANAX) 0.25 MG tablet    I have discontinued Holly Perez. Holly Perez's sertraline and losartan. I have also changed her ALPRAZolam. Additionally, I am having her start on losartan. Lastly, I am having her maintain her aspirin, Fluticasone-Salmeterol, cholecalciferol, Calcium Carbonate-Vitamin D (CALTRATE 600+D PO), multivitamin, vitamin E, fish oil-omega-3 fatty acids, albuterol, levothyroxine, amLODipine, and traMADol.  Meds ordered this encounter  Medications   ALPRAZolam (XANAX) 0.25 MG tablet    Sig: Take 1 tablet (0.25 mg total) by mouth at bedtime as needed.    Dispense:  30 tablet    Refill:  5   losartan (COZAAR) 50 MG tablet    Sig: Take 1 tablet (50 mg total) by mouth daily.    Dispense:  90 tablet    Refill:  1   traMADol (ULTRAM) 50 MG tablet    Sig: Take 1 tablet (50 mg total) by mouth 2 (two) times daily. For moderate pain    Dispense:  60 tablet    Refill:  5    Medications Discontinued During This Encounter  Medication Reason   losartan (COZAAR) 100 MG tablet    sertraline (ZOLOFT) 50 MG tablet    traMADol (ULTRAM) 50 MG tablet Reorder   ALPRAZolam (XANAX) 0.25 MG tablet     I provided  30 minutes of  face-to-face time during this encounter reviewing patient's current  issues of anxiety and chronic pain ,  labs and imaging studies, providing counseling on the above mentioned problems , and coordination  of care .   Follow-up: Return in about 6 months (around 03/09/2021).   Crecencio Mc, MD

## 2020-09-10 NOTE — Assessment & Plan Note (Signed)
Thyroid function is WNL on current dose of 50 mcg daily.  No current changes needed.    Lab Results  Component Value Date   TSH 3.34 09/05/2020

## 2020-09-10 NOTE — Assessment & Plan Note (Addendum)
She apprehensively started sertraline for management of symptomsand stateed that it did not work and has no interest in a retiral at higher dose , or an alternative.  She is using alprazolam responsibly and has not escalated use.  A faith based approach was used to allow patient to explore and manage her feelings and reactions.  The risks and benefits of benzodiazepine use were reviewed  with patient today including excessive sedation leading to respiratory depression,  impaired thinking/driving, and addiction.  Patient was advised to avoid concurrent use with alcohol, to use medication only as needed and not to share with others  .

## 2020-09-10 NOTE — Assessment & Plan Note (Signed)
Her chroni pain is managed with tylenol and tramadol in stable doses.  Refill history confirmed via [place] Controlled Substance databas, accessed by me today.Marland Kitchen

## 2020-09-10 NOTE — Assessment & Plan Note (Signed)
Stable.  Reminded to liberalize salt in diet and maintain good hydration with 60 ounces of water daily   Lab Results  Component Value Date   NA 131 (L) 09/05/2020   K 4.4 09/05/2020   CL 95 (L) 09/05/2020   CO2 26 09/05/2020

## 2020-09-10 NOTE — Assessment & Plan Note (Addendum)
Home readings on a calibrated machine have been consistently < 140/80 on 1/2 tablet (50 mg ) losartan and 10 mg amlodipine.    No changes to regimen   Lab Results  Component Value Date   CREATININE 0.87 09/05/2020   Lab Results  Component Value Date   NA 131 (L) 09/05/2020   K 4.4 09/05/2020   CL 95 (L) 09/05/2020   CO2 26 09/05/2020

## 2020-10-11 ENCOUNTER — Other Ambulatory Visit: Payer: Self-pay | Admitting: Internal Medicine

## 2020-10-13 NOTE — Telephone Encounter (Signed)
RX Refill:tramadol Last Seen:09-07-20 Last ordered:09-07-20

## 2020-11-09 ENCOUNTER — Other Ambulatory Visit: Payer: Self-pay | Admitting: Internal Medicine

## 2020-12-06 DIAGNOSIS — H2513 Age-related nuclear cataract, bilateral: Secondary | ICD-10-CM | POA: Diagnosis not present

## 2020-12-19 ENCOUNTER — Telehealth: Payer: Self-pay

## 2020-12-19 NOTE — Telephone Encounter (Signed)
Spoke with pt about being due for her colonoscopy. Pt stated that she feels like she does not need any more. Pt will call the clinic if she changes her mind.

## 2021-01-02 ENCOUNTER — Other Ambulatory Visit: Payer: Self-pay

## 2021-01-02 ENCOUNTER — Ambulatory Visit: Payer: Medicare Other

## 2021-01-02 ENCOUNTER — Ambulatory Visit (INDEPENDENT_AMBULATORY_CARE_PROVIDER_SITE_OTHER): Payer: Medicare Other

## 2021-01-02 DIAGNOSIS — Z23 Encounter for immunization: Secondary | ICD-10-CM | POA: Diagnosis not present

## 2021-02-07 ENCOUNTER — Other Ambulatory Visit: Payer: Self-pay | Admitting: Internal Medicine

## 2021-02-15 ENCOUNTER — Other Ambulatory Visit: Payer: Self-pay

## 2021-02-15 ENCOUNTER — Ambulatory Visit (INDEPENDENT_AMBULATORY_CARE_PROVIDER_SITE_OTHER): Payer: Medicare Other

## 2021-02-15 VITALS — BP 127/78 | Resp 15 | Ht 61.75 in | Wt 130.4 lb

## 2021-02-15 DIAGNOSIS — Z0000 Encounter for general adult medical examination without abnormal findings: Secondary | ICD-10-CM | POA: Diagnosis not present

## 2021-02-15 NOTE — Patient Instructions (Addendum)
  Ms. Harbeck , Thank you for taking time to come for your Medicare Wellness Visit. I appreciate your ongoing commitment to your health goals. Please review the following plan we discussed and let me know if I can assist you in the future.   These are the goals we discussed:  Goals       Patient Stated     Increase physical activity (pt-stated)      Keep walking routine      Weigh 125lb (pt-stated)        This is a list of the screening recommended for you and due dates:  Health Maintenance  Topic Date Due   COVID-19 Vaccine (4 - Booster for Pfizer series) 03/03/2021*   Zoster (Shingles) Vaccine (1 of 2) 05/18/2021*   Colon Cancer Screening  02/15/2022*   Mammogram  05/09/2021   Tetanus Vaccine  06/15/2023   Pneumonia Vaccine  Completed   Flu Shot  Completed   DEXA scan (bone density measurement)  Completed   HPV Vaccine  Aged Out  *Topic was postponed. The date shown is not the original due date.

## 2021-02-15 NOTE — Progress Notes (Addendum)
Subjective:   Holly Perez is a 80 y.o. female who presents for Medicare Annual (Subsequent) preventive examination.  Review of Systems    No ROS.  Medicare Wellness .   Cardiac Risk Factors include: advanced age (>50men, >10 women);hypertension     Objective:    Today's Vitals   02/15/21 1007  BP: 127/78  Resp: 15  SpO2: 100%  Weight: 130 lb 6.4 oz (59.1 kg)  Height: 5' 1.75" (1.568 m)   Body mass index is 24.04 kg/m.  Advanced Directives 02/15/2021 09/21/2019 02/12/2019 01/14/2018 12/26/2016 12/26/2015  Does Patient Have a Medical Advance Directive? No No No Yes No No  Type of Advance Directive - - Public librarian;Living will - -  Copy of [place] in Chart? - - - No - copy requested - -  Would patient like information on creating a medical advance directive? No - Patient declined No - Patient declined No - Patient declined - No - Patient declined No - patient declined information    Current Medications (verified) Outpatient Encounter Medications as of 02/15/2021  Medication Sig   traMADol (ULTRAM) 50 MG tablet TAKE 1 TABLET(50 MG) BY MOUTH TWICE DAILY FOR MODERATE PAIN   albuterol (PROVENTIL HFA;VENTOLIN HFA) 108 (90 BASE) MCG/ACT inhaler Inhale into the lungs every 6 (six) hours as needed for wheezing or shortness of breath.   ALPRAZolam (XANAX) 0.25 MG tablet Take 1 tablet (0.25 mg total) by mouth at bedtime as needed.   amLODipine (NORVASC) 10 MG tablet TAKE 1 TABLET(10 MG) BY MOUTH DAILY   aspirin 81 MG tablet Take 81 mg by mouth daily.   Calcium Carbonate-Vitamin D (CALTRATE 600+D PO) Take 1,200 mg by mouth daily.    cholecalciferol (VITAMIN D) 1000 UNITS tablet Take 2,000 Units by mouth daily.   fish oil-omega-3 fatty acids 1000 MG capsule Take 1 g by mouth daily.    Fluticasone-Salmeterol (ADVAIR) 250-50 MCG/DOSE AEPB Inhale 1 puff into the lungs every 12 (twelve) hours.   levothyroxine (SYNTHROID) 50 MCG tablet TAKE 1 TABLET(50  MCG) BY MOUTH DAILY BEFORE BREAKFAST   losartan (COZAAR) 50 MG tablet Take 1 tablet (50 mg total) by mouth daily.   Multiple Vitamin (MULTIVITAMIN) tablet Take 1 tablet by mouth daily.   vitamin E 100 UNIT capsule Take 100 Units by mouth daily.   No facility-administered encounter medications on file as of 02/15/2021.    Allergies (verified) Augmentin [amoxicillin-pot clavulanate] and Latex   History: Past Medical History:  Diagnosis Date   Asthma    Colon polyp 03/07/2010   2 mm tubular adenoma the ascending colon. Dr. Dionne Milo.    Hemorrhoids    Hypertension    Irritable bowel syndrome (IBS)    Past Surgical History:  Procedure Laterality Date   ABDOMINAL HYSTERECTOMY     COLONOSCOPY  2011   Dr. Ernst Breach: 51mm tubular adenoma of the ascending colon without dysplasia or malignancy.   COLONOSCOPY WITH PROPOFOL N/A 01/14/2018   Procedure: COLONOSCOPY WITH PROPOFOL;  Surgeon: Robert Bellow, MD;  Location: [facility] ENDOSCOPY;  Service: Endoscopy;  Laterality: N/A;   ECTOPIC PREGNANCY SURGERY     at age 42   OVARY SURGERY  1974   tumor   Family History  Problem Relation Age of Onset   Early death Mother    COPD Father    Early death Brother    Breast cancer Neg Hx    Social History   Socioeconomic History   Marital status:  Divorced    Spouse name: Not on file   Number of children: Not on file   Years of education: Not on file   Highest education level: Not on file  Occupational History   Not on file  Tobacco Use   Smoking status: Never   Smokeless tobacco: Never  Substance and Sexual Activity   Alcohol use: No   Drug use: No   Sexual activity: Never  Other Topics Concern   Not on file  Social History Narrative   Not on file   Social Determinants of Health   Financial Resource Strain: Low Risk    Difficulty of Paying Living Expenses: Not hard at all  Food Insecurity: No Food Insecurity   Worried About Running Out of Food in the Last Year: Never true    [place] in the Last Year: Never true  Transportation Needs: No Transportation Needs   Lack of Transportation (Medical): No   Lack of Transportation (Non-Medical): No  Physical Activity: Sufficiently Active   Days of Exercise per Week: 7 days   Minutes of Exercise per Session: 90 min  Stress: No Stress Concern Present   Feeling of Stress : Not at all  Social Connections: Unknown   Frequency of Communication with Friends and Family: More than three times a week   Frequency of Social Gatherings with Friends and Family: More than three times a week   Attends Religious Services: 1 to 4 times per year   Active Member of [employer] or Organizations: Yes   Attends Archivist Meetings: Not on file   Marital Status: Not on file    Tobacco Counseling Counseling given: Not Answered   Clinical Intake:  Pre-visit preparation completed: Yes        Diabetes: No  How often do you need to have someone help you when you read instructions, pamphlets, or other written materials from your doctor or pharmacy?: 1 - Never    Interpreter Needed?: No      Activities of Daily Living In your present state of health, do you have any difficulty performing the following activities: 02/15/2021  Hearing? N  Vision? N  Difficulty concentrating or making decisions? N  Walking or climbing stairs? N  Dressing or bathing? N  Doing errands, shopping? N  Preparing Food and eating ? N  Using the Toilet? N  In the past six months, have you accidently leaked urine? N  Do you have problems with loss of bowel control? N  Managing your Medications? N  Managing your Finances? N  Housekeeping or managing your Housekeeping? N  Some recent data might be hidden    Patient Care Team: Crecencio Mc, MD as PCP - General (Internal Medicine) Crecencio Mc, MD (Internal Medicine) Bary Castilla Forest Gleason, MD (General Surgery)  Indicate any recent Medical Services you may have received from other  than [facility] providers in the past year (date may be approximate).     Assessment:   This is a routine wellness examination for [place].  Hearing/Vision screen Hearing Screening - Comments:: Patient is able to hear conversational tones without difficulty. No issues reported. Vision Screening - Comments:: Followed by [facility]  Wears corrective lenses They have regular follow up with the ophthalmologist  Dietary issues and exercise activities discussed: Current Exercise Habits: Home exercise routine, Type of exercise: walking;treadmill;strength training/weights, Intensity: Moderate Healthy diet Good water intake   Goals Addressed  This Visit's Progress     Patient Stated     Increase physical activity (pt-stated)   On track     Keep walking routine       Depression Screen PHQ 2/9 Scores 02/15/2021 09/07/2020 02/15/2020 02/12/2019 02/04/2018 12/26/2016 07/25/2016  PHQ - 2 Score 0 1 0 0 0 0 0  PHQ- 9 Score - 7 - - - - -    Fall Risk Fall Risk  02/15/2021 09/07/2020 03/03/2020 02/15/2020 09/02/2019  Falls in the past year? 0 1 0 0 0  Comment - - - - -  Number falls in past yr: - 0 - 0 -  Injury with Fall? 0 0 - - -  Follow up Falls evaluation completed Falls evaluation completed Falls evaluation completed Falls evaluation completed Falls evaluation completed    [place]: Home free of loose throw rugs in walkways, pet beds, electrical cords, etc? Yes  Adequate lighting in your home to reduce risk of falls? Yes   ASSISTIVE DEVICES UTILIZED TO PREVENT FALLS: Life alert? No  Use of a cane, walker or w/c? No  Grab bars in the bathroom? No  Shower chair or bench in shower? Yes   TIMED UP AND GO: Was the test performed? Yes .  Length of time to ambulate 10 feet: 7 sec.   Gait steady and fast without use of assistive device  Cognitive Function: MMSE - Mini Mental State Exam 02/04/2018 12/26/2016 12/26/2015  Orientation to  time 5 5 5   Orientation to Place 5 5 5   Registration 3 3 3   Attention/ Calculation 5 5 5   Recall 3 2 3   Language- name 2 objects 2 2 2   Language- repeat 1 1 1   Language- follow 3 step command 3 3 3   Language- read & follow direction 1 1 1   Write a sentence 1 1 1   Copy design 1 1 1   Total score 30 29 30      6CIT Screen 02/15/2020 02/12/2019  What Year? 0 points 0 points  What month? 0 points 0 points  What time? 0 points 0 points  Count back from 20 0 points 0 points  Months in reverse 0 points 0 points  Repeat phrase 0 points 0 points  Total Score 0 0    Immunizations Immunization History  Administered Date(s) Administered   Fluad Quad(high Dose 65+) 02/17/2019, 01/02/2021   Influenza, High Dose Seasonal PF 12/26/2016, 01/05/2018, 02/01/2020   Influenza,inj,Quad PF,6+ Mos 12/18/2012, 01/06/2014, 12/01/2014, 12/26/2015   PFIZER(Purple Top)SARS-COV-2 Vaccination 05/27/2019, 06/22/2019, 01/15/2020   PPD Test 12/16/2012, 11/08/2014, 10/31/2015, 11/05/2016, 10/07/2017   Pneumococcal Conjugate-13 06/15/2015   Pneumococcal Polysaccharide-23 07/15/2004, 05/06/2018   Td 06/14/2013   Tdap 06/14/2013   Zoster, Live 06/08/2010    Shingrix Completed?: No.    Education has been provided regarding the importance of this vaccine. Patient has been advised to call insurance company to determine out of pocket expense if they have not yet received this vaccine. Advised may also receive vaccine at local pharmacy or Health Dept. Verbalized acceptance and understanding.  Screening Tests Health Maintenance  Topic Date Due   COVID-19 Vaccine (4 - Booster for Pfizer series) 03/03/2021 (Originally 03/11/2020)   Zoster Vaccines- Shingrix (1 of 2) 05/18/2021 (Originally [birth year]06/1989)   COLONOSCOPY (Pts 45-29yrs Insurance coverage will need to be confirmed)  02/15/2022 (Originally 01/14/2021)   MAMMOGRAM  05/09/2021   TETANUS/TDAP  06/15/2023   Pneumonia Vaccine 26+ Years old  Completed   INFLUENZA  VACCINE  Completed   DEXA SCAN  Completed   HPV VACCINES  Aged Out    Health Maintenance  There are no preventive care reminders to display for this patient.  Colonoscopy- deferred  Lung Cancer Screening: (Low Dose CT Chest recommended if Age 40-80 years, 30 pack-year currently smoking OR have quit w/in 15years.) does not qualify.   Hepatitis C Screening: does not qualify  Vision Screening: Recommended annual ophthalmology exams for early detection of glaucoma and other disorders of the eye.  Dental Screening: Recommended annual dental exams for proper oral hygiene  Community Resource Referral / Chronic Care Management: CRR required this visit?  No   CCM required this visit?  No      Plan:   Keep all routine maintenance appointments.   I have personally reviewed and noted the following in the patient's chart:   Medical and social history Use of alcohol, tobacco or illicit drugs  Current medications and supplements including opioid prescriptions. Not taking opioid. Functional ability and status Nutritional status Physical activity Advanced directives List of other physicians Hospitalizations, surgeries, and ER visits in previous 12 months Vitals Screenings to include cognitive, depression, and falls Referrals and appointments  In addition, I have reviewed and discussed with patient certain preventive protocols, quality metrics, and best practice recommendations. A written personalized care plan for preventive services as well as general preventive health recommendations were provided to patient.     OBrien-Blaney, Hallee Mckenny L, LPN   60/15/6153     I have reviewed the above information and agree with above.   Deborra Medina, MD

## 2021-03-12 ENCOUNTER — Other Ambulatory Visit: Payer: Self-pay | Admitting: Internal Medicine

## 2021-03-12 NOTE — Telephone Encounter (Signed)
RX Refill: Tramadol Last Seen: 09-07-20 Last Ordered: 10-13-20 Next Appt: 03-14-21

## 2021-03-14 ENCOUNTER — Ambulatory Visit (INDEPENDENT_AMBULATORY_CARE_PROVIDER_SITE_OTHER): Payer: Medicare Other | Admitting: Internal Medicine

## 2021-03-14 ENCOUNTER — Encounter: Payer: Self-pay | Admitting: Internal Medicine

## 2021-03-14 ENCOUNTER — Other Ambulatory Visit: Payer: Self-pay

## 2021-03-14 VITALS — BP 130/78 | HR 80 | Temp 96.1°F | Ht 62.0 in | Wt 128.8 lb

## 2021-03-14 DIAGNOSIS — F411 Generalized anxiety disorder: Secondary | ICD-10-CM

## 2021-03-14 DIAGNOSIS — E039 Hypothyroidism, unspecified: Secondary | ICD-10-CM

## 2021-03-14 DIAGNOSIS — E785 Hyperlipidemia, unspecified: Secondary | ICD-10-CM

## 2021-03-14 DIAGNOSIS — M545 Low back pain, unspecified: Secondary | ICD-10-CM

## 2021-03-14 DIAGNOSIS — M810 Age-related osteoporosis without current pathological fracture: Secondary | ICD-10-CM | POA: Diagnosis not present

## 2021-03-14 DIAGNOSIS — I10 Essential (primary) hypertension: Secondary | ICD-10-CM

## 2021-03-14 DIAGNOSIS — E871 Hypo-osmolality and hyponatremia: Secondary | ICD-10-CM

## 2021-03-14 DIAGNOSIS — M546 Pain in thoracic spine: Secondary | ICD-10-CM

## 2021-03-14 LAB — COMPREHENSIVE METABOLIC PANEL
ALT: 20 U/L (ref 0–35)
AST: 28 U/L (ref 0–37)
Albumin: 4.2 g/dL (ref 3.5–5.2)
Alkaline Phosphatase: 62 U/L (ref 39–117)
BUN: 13 mg/dL (ref 6–23)
CO2: 30 mEq/L (ref 19–32)
Calcium: 9.7 mg/dL (ref 8.4–10.5)
Chloride: 95 mEq/L — ABNORMAL LOW (ref 96–112)
Creatinine, Ser: 0.78 mg/dL (ref 0.40–1.20)
GFR: 71.1 mL/min (ref >60.00)
Glucose, Bld: 77 mg/dL (ref 70–99)
Potassium: 4.8 mEq/L (ref 3.5–5.1)
Sodium: 130 mEq/L — ABNORMAL LOW (ref 135–145)
Total Bilirubin: 0.4 mg/dL (ref 0.2–1.2)
Total Protein: 8.2 g/dL (ref 6.0–8.3)

## 2021-03-14 LAB — TSH: TSH: 2.8 u[IU]/mL (ref 0.35–5.50)

## 2021-03-14 MED ORDER — TRAMADOL HCL 50 MG PO TABS: ORAL_TABLET | ORAL | 5 refills | Status: DC

## 2021-03-14 MED ORDER — ZOSTER VAC RECOMB ADJUVANTED 50 MCG/0.5ML IM SUSR: 0.5000 mL | Freq: Once | INTRAMUSCULAR | 1 refills | Status: AC

## 2021-03-14 MED ORDER — ALPRAZOLAM 0.25 MG PO TABS: 0.2500 mg | ORAL_TABLET | Freq: Every evening | ORAL | 5 refills | Status: DC | PRN
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
## 2021-03-14 NOTE — Assessment & Plan Note (Signed)
Stable.  Reminded to liberalize salt in diet and maintain good hydration with 60 ounces of water daily   Lab Results  Component Value Date   NA 131 (L) 09/05/2020   K 4.4 09/05/2020   CL 95 (L) 09/05/2020   CO2 26 09/05/2020
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
## 2021-03-14 NOTE — Assessment & Plan Note (Addendum)
Diagnosed by T score of forearm .  T score of spine was-1.8 and hip -1.5 by 2019 DEXA .  She continues to defer  therapy

## 2021-03-14 NOTE — Assessment & Plan Note (Signed)
She has not tolerated trials of SSRIs .  She is using alprazolam responsibly and has not escalated use.  A faith based approach was used to allow patient to explore and manage her feelings and reactions.  The risks and benefits of benzodiazepine use were reviewed  with patient today including excessive sedation leading to respiratory depression,  impaired thinking/driving, and addiction.  Patient was advised to avoid concurrent use with alcohol, to use medication only as needed and not to share with others  .

## 2021-03-14 NOTE — Patient Instructions (Signed)
You are doing very well!  No changes were made to your medications today  I have refilled your alprazolam and tramadol for 6 months

## 2021-03-14 NOTE — Assessment & Plan Note (Signed)
Home readings are consistently < 130/80 on amlodipine and losartan.Marland Kitchen  No changes today

## 2021-03-19 ENCOUNTER — Telehealth: Payer: Self-pay

## 2021-03-19 NOTE — Telephone Encounter (Signed)
LMTCB in regards to lab results.  

## 2021-05-08 ENCOUNTER — Other Ambulatory Visit: Payer: Self-pay | Admitting: Internal Medicine

## 2021-05-09 ENCOUNTER — Other Ambulatory Visit: Payer: Self-pay | Admitting: Internal Medicine

## 2021-05-09 DIAGNOSIS — Z1231 Encounter for screening mammogram for malignant neoplasm of breast: Secondary | ICD-10-CM

## 2021-05-14 ENCOUNTER — Ambulatory Visit
Admission: RE | Admit: 2021-05-14 | Discharge: 2021-05-14 | Disposition: A | Discharge status: Home / Self Care | Payer: Medicare Other | Source: Ambulatory Visit | Attending: Internal Medicine | Admitting: Internal Medicine

## 2021-05-14 ENCOUNTER — Other Ambulatory Visit: Payer: Self-pay

## 2021-05-14 DIAGNOSIS — Z1231 Encounter for screening mammogram for malignant neoplasm of breast: Secondary | ICD-10-CM | POA: Insufficient documentation

## 2021-05-28 ENCOUNTER — Telehealth: Payer: Self-pay | Admitting: Internal Medicine

## 2021-05-28 ENCOUNTER — Telehealth: Payer: Self-pay | Admitting: *Deleted

## 2021-05-28 DIAGNOSIS — R300 Dysuria: Secondary | ICD-10-CM

## 2021-05-28 NOTE — Telephone Encounter (Signed)
Spoke with pt and scheduled her for a lab appt tomorrow and a office visit with Dr. Derrel Nip later in the week. Pt is aware of appt dates and times.

## 2021-05-28 NOTE — Telephone Encounter (Signed)
Pt came into office thinking she may have a UTI. Pt wants to drop a specimen off. Pt request to be called

## 2021-05-28 NOTE — Telephone Encounter (Signed)
Orders placed.

## 2021-05-28 NOTE — Telephone Encounter (Signed)
Please place future orders for lab appt.  

## 2021-05-29 ENCOUNTER — Other Ambulatory Visit: Payer: Self-pay

## 2021-05-29 ENCOUNTER — Other Ambulatory Visit (INDEPENDENT_AMBULATORY_CARE_PROVIDER_SITE_OTHER): Payer: Medicare Other

## 2021-05-29 DIAGNOSIS — R300 Dysuria: Secondary | ICD-10-CM | POA: Diagnosis not present

## 2021-05-29 LAB — URINALYSIS, ROUTINE W REFLEX MICROSCOPIC
Bilirubin Urine: NEGATIVE
Ketones, ur: NEGATIVE
Nitrite: NEGATIVE
Specific Gravity, Urine: 1.01 (ref 1.000–1.030)
Urine Glucose: NEGATIVE
Urobilinogen, UA: 0.2 (ref 0.0–1.0)
pH: 6.5 (ref 5.0–8.0)

## 2021-05-30 LAB — URINE CULTURE
MICRO NUMBER:: 13067745
SPECIMEN QUALITY:: ADEQUATE

## 2021-05-31 ENCOUNTER — Other Ambulatory Visit: Payer: Self-pay

## 2021-05-31 ENCOUNTER — Encounter: Payer: Self-pay | Admitting: Internal Medicine

## 2021-05-31 ENCOUNTER — Ambulatory Visit (INDEPENDENT_AMBULATORY_CARE_PROVIDER_SITE_OTHER): Payer: Medicare Other | Admitting: Internal Medicine

## 2021-05-31 VITALS — BP 136/80 | HR 78 | Temp 97.7°F | Ht 60.0 in | Wt 129.8 lb

## 2021-05-31 DIAGNOSIS — M40209 Unspecified kyphosis, site unspecified: Secondary | ICD-10-CM | POA: Insufficient documentation

## 2021-05-31 DIAGNOSIS — R300 Dysuria: Secondary | ICD-10-CM

## 2021-05-31 DIAGNOSIS — I10 Essential (primary) hypertension: Secondary | ICD-10-CM | POA: Diagnosis not present

## 2021-05-31 DIAGNOSIS — M4003 Postural kyphosis, cervicothoracic region: Secondary | ICD-10-CM | POA: Diagnosis not present

## 2021-05-31 DIAGNOSIS — M545 Low back pain, unspecified: Secondary | ICD-10-CM | POA: Diagnosis not present

## 2021-05-31 DIAGNOSIS — M546 Pain in thoracic spine: Secondary | ICD-10-CM | POA: Diagnosis not present

## 2021-05-31 DIAGNOSIS — E871 Hypo-osmolality and hyponatremia: Secondary | ICD-10-CM

## 2021-05-31 MED ORDER — AMLODIPINE BESYLATE 10 MG PO TABS: ORAL_TABLET | ORAL | 1 refills | Status: DC

## 2021-05-31 MED ORDER — SULFAMETHOXAZOLE-TRIMETHOPRIM 800-160 MG PO TABS: 1.0000 | ORAL_TABLET | Freq: Two times a day (BID) | ORAL | 0 refills | Status: DC

## 2021-05-31 NOTE — Patient Instructions (Addendum)
?  I am treating  you for a  Urinary tract infection  With  Septra DS .    please take it twice daily for 7 days ? ? ?Please take a probiotic ( Align, Flora que or [employer]) for 2 weeks if you start the antibiotic to prevent a serious antibiotic associated diarrhea  Called" clostridium difficile colitis" ( should also help prevent   vaginal yeast infection)   ? ? ?If your symptoms do not resolve,  we will repeat your urine test and schedule you for a pelvic examination  ?

## 2021-05-31 NOTE — Assessment & Plan Note (Addendum)
Stable and present since 2011.  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    Reminded to liberalize salt in diet and maintain good hydration with 60 ounces of water daily  ? ?Lab Results  ?Component Value Date  ? NA 130 (L) 03/14/2021  ? K 4.8 03/14/2021  ? CL 95 (L) 03/14/2021  ? CO2 30 03/14/2021  ? ? ?
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
## 2021-05-31 NOTE — Assessment & Plan Note (Signed)
Chronic,  With no painful history to suggest vertebral fracture  ?

## 2021-05-31 NOTE — Progress Notes (Signed)
Pain sometimes when urinating.color is a little cloudy at times and urine sometimes has a odor. Starting to become itchy as well ?

## 2021-05-31 NOTE — Assessment & Plan Note (Signed)
Home readings are consistently < 130/80 on amlodipine and losartan.Marland Kitchen  No changes today ?

## 2021-05-31 NOTE — Assessment & Plan Note (Signed)
UA notes pyuria but culture is inconclusive .  Given her persistent symptoms for over a month,  will treat empirically with Septra x 1 week . If symptoms persist,  Repeat UA/ulure and pelvic exam needed .  Probiotic use advised  ?
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
## 2021-08-07 ENCOUNTER — Other Ambulatory Visit: Payer: Self-pay

## 2021-08-07 MED ORDER — LOSARTAN POTASSIUM 50 MG PO TABS: 50.0000 mg | ORAL_TABLET | Freq: Every day | ORAL | 1 refills | Status: DC

## 2021-09-11 ENCOUNTER — Other Ambulatory Visit: Payer: Self-pay

## 2021-09-11 MED ORDER — ALPRAZOLAM 0.25 MG PO TABS: 0.2500 mg | ORAL_TABLET | Freq: Every evening | ORAL | 5 refills | Status: DC | PRN

## 2021-09-11 NOTE — Telephone Encounter (Signed)
Alprazolam refilled.  

## 2021-09-11 NOTE — Telephone Encounter (Signed)
Refilled: 03/14/2021 Last OV: 03/14/2021 Next OV: 09/13/2021

## 2021-09-13 ENCOUNTER — Encounter: Payer: Self-pay | Admitting: Internal Medicine

## 2021-09-13 ENCOUNTER — Ambulatory Visit (INDEPENDENT_AMBULATORY_CARE_PROVIDER_SITE_OTHER): Payer: Medicare Other | Admitting: Internal Medicine

## 2021-09-13 VITALS — BP 140/78 | HR 66 | Temp 98.0°F | Ht 60.0 in | Wt 131.4 lb

## 2021-09-13 DIAGNOSIS — E785 Hyperlipidemia, unspecified: Secondary | ICD-10-CM | POA: Diagnosis not present

## 2021-09-13 DIAGNOSIS — M810 Age-related osteoporosis without current pathological fracture: Secondary | ICD-10-CM

## 2021-09-13 DIAGNOSIS — E039 Hypothyroidism, unspecified: Secondary | ICD-10-CM

## 2021-09-13 DIAGNOSIS — R809 Proteinuria, unspecified: Secondary | ICD-10-CM

## 2021-09-13 DIAGNOSIS — F418 Other specified anxiety disorders: Secondary | ICD-10-CM

## 2021-09-13 DIAGNOSIS — M1732 Unilateral post-traumatic osteoarthritis, left knee: Secondary | ICD-10-CM

## 2021-09-13 DIAGNOSIS — M545 Low back pain, unspecified: Secondary | ICD-10-CM | POA: Diagnosis not present

## 2021-09-13 DIAGNOSIS — Z79899 Other long term (current) drug therapy: Secondary | ICD-10-CM

## 2021-09-13 DIAGNOSIS — R7301 Impaired fasting glucose: Secondary | ICD-10-CM | POA: Diagnosis not present

## 2021-09-13 DIAGNOSIS — E871 Hypo-osmolality and hyponatremia: Secondary | ICD-10-CM | POA: Diagnosis not present

## 2021-09-13 DIAGNOSIS — M546 Pain in thoracic spine: Secondary | ICD-10-CM

## 2021-09-13 DIAGNOSIS — I10 Essential (primary) hypertension: Secondary | ICD-10-CM

## 2021-09-13 LAB — COMPREHENSIVE METABOLIC PANEL
ALT: 20 U/L (ref 0–35)
AST: 31 U/L (ref 0–37)
Albumin: 4.5 g/dL (ref 3.5–5.2)
Alkaline Phosphatase: 64 U/L (ref 39–117)
BUN: 12 mg/dL (ref 6–23)
CO2: 28 mEq/L (ref 19–32)
Calcium: 9.7 mg/dL (ref 8.4–10.5)
Chloride: 95 mEq/L — ABNORMAL LOW (ref 96–112)
Creatinine, Ser: 0.88 mg/dL (ref 0.40–1.20)
GFR: 61.3 mL/min (ref >60.00)
Glucose, Bld: 81 mg/dL (ref 70–99)
Potassium: 4.5 mEq/L (ref 3.5–5.1)
Sodium: 131 mEq/L — ABNORMAL LOW (ref 135–145)
Total Bilirubin: 0.5 mg/dL (ref 0.2–1.2)
Total Protein: 8.4 g/dL — ABNORMAL HIGH (ref 6.0–8.3)

## 2021-09-13 LAB — LIPID PANEL
Cholesterol: 165 mg/dL (ref 0–200)
HDL: 67.2 mg/dL (ref >39.00)
LDL Cholesterol: 84 mg/dL (ref 0–99)
NonHDL: 98.19
Total CHOL/HDL Ratio: 2
Triglycerides: 69 mg/dL (ref 0.0–149.0)
VLDL: 13.8 mg/dL (ref 0.0–40.0)

## 2021-09-13 LAB — CBC WITH DIFFERENTIAL/PLATELET
Basophils Absolute: 0.1 10*3/uL (ref 0.0–0.1)
Basophils Relative: 1.2 % (ref 0.0–3.0)
Eosinophils Absolute: 0.2 10*3/uL (ref 0.0–0.7)
Eosinophils Relative: 3.4 % (ref 0.0–5.0)
HCT: 39.5 % (ref 36.0–46.0)
Hemoglobin: 12.9 g/dL (ref 12.0–15.0)
Lymphocytes Relative: 36.6 % (ref 12.0–46.0)
Lymphs Abs: 1.6 10*3/uL (ref 0.7–4.0)
MCHC: 32.7 g/dL (ref 30.0–36.0)
MCV: 89.8 fl (ref 78.0–100.0)
Monocytes Absolute: 0.3 10*3/uL (ref 0.1–1.0)
Monocytes Relative: 7.5 % (ref 3.0–12.0)
Neutro Abs: 2.3 10*3/uL (ref 1.4–7.7)
Neutrophils Relative %: 51.3 % (ref 43.0–77.0)
Platelets: 386 10*3/uL (ref 150.0–400.0)
RBC: 4.4 Mil/uL (ref 3.87–5.11)
RDW: 13.3 % (ref 11.5–15.5)
WBC: 4.4 10*3/uL (ref 4.0–10.5)

## 2021-09-13 LAB — MICROALBUMIN / CREATININE URINE RATIO
Creatinine,U: 180.8 mg/dL
Microalb Creat Ratio: 12.8 mg/g (ref 0.0–30.0)
Microalb, Ur: 23.1 mg/dL — ABNORMAL HIGH (ref 0.0–1.9)

## 2021-09-13 LAB — HEMOGLOBIN A1C: Hgb A1c MFr Bld: 5.9 % (ref 4.6–6.5)

## 2021-09-13 LAB — TSH: TSH: 5.91 u[IU]/mL — ABNORMAL HIGH (ref 0.35–5.50)

## 2021-09-13 NOTE — Patient Instructions (Signed)
You can increase your sodium intake with an ounce of dill pickle juice daily   Your alprazolam has been refilled for 6 months  I'll see you again in December

## 2021-09-13 NOTE — Assessment & Plan Note (Addendum)
Managed with exercise,  Weight training,  Tylenol and prn Tramadol .  She has not had any ER visits  And has not requested any early refills.  Her Refill history was confirmed via [place] Controlled Substance database by me today during her visit and there have been no prescriptions of controlled substances filled from any providers other than me. .  

## 2021-09-13 NOTE — Progress Notes (Signed)
Subjective:  Patient ID: Holly Perez, female    DOB: Mar 12, 1941  Age: 81 y.o. MRN: [id number]  CC: The primary encounter diagnosis was Acquired hypothyroidism. Diagnoses of Microalbuminuria, Essential hypertension, Hyperlipidemia, unspecified hyperlipidemia type, Impaired fasting glucose, Long-term use of high-risk medication, Post-traumatic osteoarthritis of left knee, Back pain of thoracolumbar region, Osteoporosis, postmenopausal, Mixed anxiety and depressive disorder, and White coat syndrome with hypertension were also pertinent to this visit.   HPI Holly Perez presents for follow up on htn and other issues  Chief Complaint  Patient presents with   Follow-up    6 month follow up on hypertension, hypothyroidism   1) Hypertension: patient checks blood pressure twice weekly at home.  Readings have been for the most part < 140/80 at rest . Patient is following a reduce salt diet most days and is taking medications as prescribed   2) Chronic back and knee pain : she remains physically active ,  exercising daily with walks and strength training..  no history of falls.   3) taking a daily probiotic which has improved her digestive system.  4) Insomnia:  ran out of alprazolam several days ago.  Not sleeping well without it.  No withdrawal symptoms    Outpatient Medications Prior to Visit  Medication Sig Dispense Refill   albuterol (PROVENTIL HFA;VENTOLIN HFA) 108 (90 BASE) MCG/ACT inhaler Inhale into the lungs every 6 (six) hours as needed for wheezing or shortness of breath.     ALPRAZolam (XANAX) 0.25 MG tablet Take 1 tablet (0.25 mg total) by mouth at bedtime as needed. 30 tablet 5   amLODipine (NORVASC) 10 MG tablet TAKE 1 TABLET(10 MG) BY MOUTH DAILY 90 tablet 1   aspirin 81 MG tablet Take 81 mg by mouth daily.     Calcium Carbonate-Vitamin D (CALTRATE 600+D PO) Take 1,200 mg by mouth daily.      cholecalciferol (VITAMIN D) 1000 UNITS tablet Take 2,000 Units by mouth daily.      fish oil-omega-3 fatty acids 1000 MG capsule Take 1 g by mouth daily.      Fluticasone-Salmeterol (ADVAIR) 250-50 MCG/DOSE AEPB Inhale 1 puff into the lungs every 12 (twelve) hours.     levothyroxine (SYNTHROID) 50 MCG tablet TAKE 1 TABLET(50 MCG) BY MOUTH DAILY BEFORE BREAKFAST 90 tablet 1   losartan (COZAAR) 50 MG tablet Take 1 tablet (50 mg total) by mouth daily. 90 tablet 1   Multiple Vitamin (MULTIVITAMIN) tablet Take 1 tablet by mouth daily.     Probiotic Product (PROBIOTIC ADVANCED PO) Take 1 capsule by mouth daily at 2 am.     traMADol (ULTRAM) 50 MG tablet TAKE 1 TABLET(50 MG) BY MOUTH TWICE DAILY FOR MODERATE PAIN 60 tablet 5   vitamin E 100 UNIT capsule Take 100 Units by mouth daily.     sulfamethoxazole-trimethoprim (BACTRIM DS) 800-160 MG tablet Take 1 tablet by mouth 2 (two) times daily. (Patient not taking: Reported on 09/13/2021) 14 tablet 0   No facility-administered medications prior to visit.    Review of Systems;  Patient denies headache, fevers, malaise, unintentional weight loss, skin rash, eye pain, sinus congestion and sinus pain, sore throat, dysphagia,  hemoptysis , cough, dyspnea, wheezing, chest pain, palpitations, orthopnea, edema, abdominal pain, nausea, melena, diarrhea, constipation, flank pain, dysuria, hematuria, urinary  Frequency, nocturia, numbness, tingling, seizures,  Focal weakness, Loss of consciousness,  Tremor, insomnia, depression, anxiety, and suicidal ideation.      Objective:  BP 140/78 (BP Location: Left Arm, Patient  Position: Sitting, Cuff Size: Normal)   Pulse 66   Temp 98 F (36.7 C) (Oral)   Ht 5' (1.524 m)   Wt 131 lb 6.4 oz (59.6 kg)   SpO2 99%   BMI 25.66 kg/m   BP Readings from Last 3 Encounters:  09/13/21 140/78  05/31/21 136/80  03/14/21 130/78    Wt Readings from Last 3 Encounters:  09/13/21 131 lb 6.4 oz (59.6 kg)  05/31/21 129 lb 12.8 oz (58.9 kg)  03/14/21 128 lb 12.8 oz (58.4 kg)    General appearance: alert,  cooperative and appears stated age Ears: normal TM's and external ear canals both ears Throat: lips, mucosa, and tongue normal; teeth and gums normal Neck: no adenopathy, no carotid bruit, supple, symmetrical, trachea midline and thyroid not enlarged, symmetric, no tenderness/mass/nodules Back: symmetric, no curvature. ROM normal. No CVA tenderness. Lungs: clear to auscultation bilaterally Heart: regular rate and rhythm, S1, S2 normal, no murmur, click, rub or gallop Abdomen: soft, non-tender; bowel sounds normal; no masses,  no organomegaly Pulses: 2+ and symmetric Skin: Skin color, texture, turgor normal. No rashes or lesions Lymph nodes: Cervical, supraclavicular, and axillary nodes normal.  No results found for: "HGBA1C"  Lab Results  Component Value Date   CREATININE 0.78 03/14/2021   CREATININE 0.87 09/05/2020   CREATININE 0.77 04/13/2020    Lab Results  Component Value Date   WBC 4.9 12/01/2014   HGB 12.1 12/01/2014   HCT 36.5 12/01/2014   PLT 366.0 12/01/2014   GLUCOSE 77 03/14/2021   CHOL 156 09/05/2020   TRIG 57.0 09/05/2020   HDL 65.90 09/05/2020   LDLCALC 79 09/05/2020   ALT 20 03/14/2021   AST 28 03/14/2021   NA 130 (L) 03/14/2021   K 4.8 03/14/2021   CL 95 (L) 03/14/2021   CREATININE 0.78 03/14/2021   BUN 13 03/14/2021   CO2 30 03/14/2021   TSH 2.80 03/14/2021   MICROALBUR 5.8 (H) 09/05/2020    MM 3D SCREEN BREAST BILATERAL  Result Date: 05/14/2021 CLINICAL DATA:  Screening. EXAM: DIGITAL SCREENING BILATERAL MAMMOGRAM WITH TOMOSYNTHESIS AND CAD TECHNIQUE: Bilateral screening digital craniocaudal and mediolateral oblique mammograms were obtained. Bilateral screening digital breast tomosynthesis was performed. The images were evaluated with computer-aided detection. COMPARISON:  Previous exam(s). ACR Breast Density Category b: There are scattered areas of fibroglandular density. FINDINGS: There are no findings suspicious for malignancy. IMPRESSION: No  mammographic evidence of malignancy. A result letter of this screening mammogram will be mailed directly to the patient. RECOMMENDATION: Screening mammogram in one year. (Code:SM-B-01Y) BI-RADS CATEGORY  1: Negative. Electronically Signed   By: Lillia Mountain M.D.   On: 05/14/2021 16:08   Assessment & Plan:   Problem List Items Addressed This Visit     White coat syndrome with hypertension    Home readings are consistently < 130/80 on amlodipine and losartan.Marland Kitchen  No changes today      Osteoporosis, postmenopausal    Forearm only..  Defers treatment.  Walking daily and using weights      Mixed anxiety and depressive disorder    She has not tolerated trials of SSRIs .  She is using alprazolam responsibly and has not escalated use.  A faith based approach was used to allow patient to explore and manage her feelings and reactions.  The risks and benefits of benzodiazepine use were reviewed  with patient today including excessive sedation leading to respiratory depression,  impaired thinking/driving, and addiction.  Patient has had no withdrawal symptoms during  periods of unintentional suspension. She was reminded to avoid concurrent use with alcohol, to use medication only as needed and not to share with others  .       Microalbuminuria   Relevant Orders   Urine Microalbumin w/creat. ratio   DJD (degenerative joint disease) of knee    managed with tramadol and behavior modication       Back pain of thoracolumbar region    Managed with exercise,  Weight training,  Tylenol and prn Tramadol .  She has not had any ER visits  And has not requested any early refills.  Her Refill history was confirmed via [place] Controlled Substance database by me today during her visit and there have been no prescriptions of controlled substances filled from any providers other than me. .       Acquired hypothyroidism - Primary   Relevant Orders   TSH   Other Visit Diagnoses     Essential hypertension        Relevant Orders   Comp Met (CMET)   Urine Microalbumin w/creat. ratio   Hyperlipidemia, unspecified hyperlipidemia type       Relevant Orders   Lipid Profile   Impaired fasting glucose       Relevant Orders   HgB A1c   Long-term use of high-risk medication       Relevant Orders   CBC with Differential/Platelet       I spent a total of   minutes with this patient in a face to face visit on the date of this encounter reviewing the last office visit with me on        ,  most recent with patient's cardiologist in    ,  patient'ss diet and eating habits, home blood pressure readings ,  most recent imaging study ,   and post visit ordering of testing and therapeutics.    Follow-up: No follow-ups on file.   Crecencio Mc, MD

## 2021-09-13 NOTE — Assessment & Plan Note (Signed)
Home readings are consistently < 130/80 on amlodipine and losartan.Marland Kitchen  No changes today

## 2021-09-13 NOTE — Assessment & Plan Note (Signed)
Forearm only..  Defers treatment.  Walking daily and using weights

## 2021-09-13 NOTE — Assessment & Plan Note (Signed)
She has not tolerated trials of SSRIs .  She is using alprazolam responsibly and has not escalated use.  A faith based approach was used to allow patient to explore and manage her feelings and reactions.  The risks and benefits of benzodiazepine use were reviewed  with patient today including excessive sedation leading to respiratory depression,  impaired thinking/driving, and addiction.  Patient has had no withdrawal symptoms during periods of unintentional suspension. She was reminded to avoid concurrent use with alcohol, to use medication only as needed and not to share with others  .

## 2021-09-13 NOTE — Assessment & Plan Note (Signed)
managed with tramadol and behavior modication

## 2021-09-15 ENCOUNTER — Other Ambulatory Visit: Payer: Self-pay | Admitting: Internal Medicine

## 2021-09-15 DIAGNOSIS — Z86010 Personal history of colonic polyps: Secondary | ICD-10-CM

## 2021-09-15 DIAGNOSIS — E039 Hypothyroidism, unspecified: Secondary | ICD-10-CM

## 2021-09-15 MED ORDER — LEVOTHYROXINE SODIUM 75 MCG PO TABS: 75.0000 ug | ORAL_TABLET | Freq: Every day | ORAL | 0 refills | Status: DC

## 2021-09-15 NOTE — Assessment & Plan Note (Signed)
Minimal.  Continue losartan  Lab Results  Component Value Date   MICROALBUR 23.1 (H) 09/13/2021   MICROALBUR 5.8 (H) 09/05/2020

## 2021-09-15 NOTE — Assessment & Plan Note (Signed)
Stable and present since 2011.  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    Reminded to liberalize salt in diet and maintain good hydration with 60 ounces of water daily   Lab Results  Component Value Date   NA 131 (L) 09/13/2021   K 4.5 09/13/2021   CL 95 (L) 09/13/2021   CO2 28 09/13/2021

## 2021-09-15 NOTE — Assessment & Plan Note (Signed)
Patient's thyroid function is underactive on current levothyroxine dose of 50  mcg daily.  She is due for refill.  increase dose to 75 mcg daily and recheck TSH in 6 weeks

## 2021-09-17 ENCOUNTER — Telehealth: Payer: Self-pay

## 2021-09-17 NOTE — Telephone Encounter (Signed)
-----   Message from Crecencio Mc, MD sent at 09/15/2021  7:22 PM EDT ----- Your CBC,  A1c,  cholesterol,  liver and kidney function have been reviewed and there are no significant or worrisome findings, except that Your thyroid is underactive on your current levothyroxine dose of 50 mcg daily ; I would like you to increase your dose to 75 mcg daily have sent a higher dose to your pharmacy and would like you to start it now.  We will need to repeat your thyroid function after a minimum of 6 weeks.     Regards,  Dr. Derrel Nip

## 2021-09-17 NOTE — Telephone Encounter (Signed)
Patient returned office phone call for lab results. Patient was read note and scheduled a lab for 10/25/2021. Patient states she will pick up medication on 09/18/21 and start taking that day.

## 2021-09-17 NOTE — Telephone Encounter (Signed)
noted 

## 2021-09-17 NOTE — Telephone Encounter (Signed)
LMTCB in regards to lab results.  

## 2021-09-18 NOTE — Telephone Encounter (Signed)
Pt is returning call from [place]

## 2021-09-18 NOTE — Telephone Encounter (Signed)
Spoke with pt to let her know that I realized after I left the message that she had already called back and was given the results.

## 2021-10-04 DIAGNOSIS — I10 Essential (primary) hypertension: Secondary | ICD-10-CM | POA: Diagnosis not present

## 2021-10-04 DIAGNOSIS — E871 Hypo-osmolality and hyponatremia: Secondary | ICD-10-CM | POA: Diagnosis not present

## 2021-10-04 DIAGNOSIS — E890 Postprocedural hypothyroidism: Secondary | ICD-10-CM | POA: Diagnosis not present

## 2021-10-11 ENCOUNTER — Other Ambulatory Visit: Payer: Self-pay

## 2021-10-11 MED ORDER — TRAMADOL HCL 50 MG PO TABS: ORAL_TABLET | ORAL | 5 refills | Status: DC

## 2021-10-11 NOTE — Telephone Encounter (Signed)
Refilled: 03/14/2021 Last OV: 09/13/2021 Next OV: 03/21/2022

## 2021-10-25 ENCOUNTER — Other Ambulatory Visit (INDEPENDENT_AMBULATORY_CARE_PROVIDER_SITE_OTHER): Payer: Medicare Other

## 2021-10-25 DIAGNOSIS — E039 Hypothyroidism, unspecified: Secondary | ICD-10-CM | POA: Diagnosis not present

## 2021-10-25 LAB — TSH: TSH: 0.37 u[IU]/mL (ref 0.35–5.50)

## 2021-10-28 NOTE — Assessment & Plan Note (Signed)
TSH was suppressed on 75 mcg daily and underactive on 50 mcg daily.  I have asked her to reduce dose to 6 days per week   Lab Results  Component Value Date   TSH 0.37 10/25/2021

## 2021-10-31 ENCOUNTER — Other Ambulatory Visit: Payer: Self-pay

## 2021-10-31 ENCOUNTER — Telehealth: Payer: Self-pay | Admitting: Internal Medicine

## 2021-10-31 DIAGNOSIS — E039 Hypothyroidism, unspecified: Secondary | ICD-10-CM

## 2021-10-31 NOTE — Telephone Encounter (Signed)
I called and spoke with patient to review her TSH result. She will not take the levothyroxine 75 mcg n Sundays. She is scheduled in 6 weeks to repeat TSH & I have ordered.

## 2021-10-31 NOTE — Telephone Encounter (Signed)
Pt called stating she want to know her lab results. Pt would like to be called

## 2021-11-15 ENCOUNTER — Other Ambulatory Visit: Payer: Self-pay

## 2021-11-15 MED ORDER — AMLODIPINE BESYLATE 10 MG PO TABS: ORAL_TABLET | ORAL | 1 refills | Status: DC

## 2021-12-11 ENCOUNTER — Other Ambulatory Visit (INDEPENDENT_AMBULATORY_CARE_PROVIDER_SITE_OTHER): Payer: Medicare Other

## 2021-12-11 DIAGNOSIS — E039 Hypothyroidism, unspecified: Secondary | ICD-10-CM

## 2021-12-11 LAB — TSH: TSH: 1.37 u[IU]/mL (ref 0.35–5.50)

## 2021-12-14 ENCOUNTER — Telehealth: Payer: Self-pay | Admitting: Internal Medicine

## 2021-12-14 NOTE — Telephone Encounter (Signed)
Spoke with pt and informed her of her lab results. Pt gave a verbal understanding.  

## 2021-12-14 NOTE — Telephone Encounter (Signed)
Pt would like to be called regarding her blood work

## 2021-12-20 ENCOUNTER — Ambulatory Visit (INDEPENDENT_AMBULATORY_CARE_PROVIDER_SITE_OTHER): Payer: Medicare Other

## 2021-12-20 DIAGNOSIS — Z23 Encounter for immunization: Secondary | ICD-10-CM

## 2021-12-22 ENCOUNTER — Other Ambulatory Visit: Payer: Self-pay | Admitting: Internal Medicine

## 2022-01-24 DIAGNOSIS — H2513 Age-related nuclear cataract, bilateral: Secondary | ICD-10-CM | POA: Diagnosis not present

## 2022-02-04 ENCOUNTER — Other Ambulatory Visit: Payer: Self-pay | Admitting: Internal Medicine

## 2022-02-15 ENCOUNTER — Telehealth: Payer: Self-pay | Admitting: Internal Medicine

## 2022-02-15 NOTE — Telephone Encounter (Signed)
Spoke with patient she state she WCB to sched AWV due to her work sched.

## 2022-02-28 ENCOUNTER — Telehealth: Payer: Self-pay

## 2022-02-28 MED ORDER — AMLODIPINE BESYLATE 10 MG PO TABS: ORAL_TABLET | ORAL | 1 refills | Status: DC

## 2022-02-28 NOTE — Telephone Encounter (Signed)
AMLODIPINE SENT TO Holly Perez

## 2022-02-28 NOTE — Telephone Encounter (Signed)
Spoke to patient and informed her that her Amlodopine was sent into pharmacy

## 2022-02-28 NOTE — Telephone Encounter (Signed)
Patient states she needs a refill for her amLODipine (NORVASC) 10 MG tablet.  Patient states her friend gave her some of her leftover medication and she has been taking it.  Patient states this is the exact same medication and same dose.  Patient states she realizes she should have checked with Dr. Deborra Medina, but she was trying to save money.  *Patient states her preferred pharmacy is [facility] on [address].

## 2022-03-06 DIAGNOSIS — Z23 Encounter for immunization: Secondary | ICD-10-CM | POA: Diagnosis not present

## 2022-03-13 NOTE — Telephone Encounter (Signed)
Error

## 2022-03-15 ENCOUNTER — Other Ambulatory Visit: Payer: Self-pay

## 2022-03-15 NOTE — Telephone Encounter (Signed)
Refilled: 09/11/2021 Last OV: 09/13/2021 Next OV: 03/21/2022

## 2022-03-16 MED ORDER — ALPRAZOLAM 0.25 MG PO TABS: 0.2500 mg | ORAL_TABLET | Freq: Every evening | ORAL | 5 refills | Status: DC | PRN

## 2022-03-21 ENCOUNTER — Ambulatory Visit (INDEPENDENT_AMBULATORY_CARE_PROVIDER_SITE_OTHER): Payer: Medicare Other | Admitting: Internal Medicine

## 2022-03-21 ENCOUNTER — Ambulatory Visit (INDEPENDENT_AMBULATORY_CARE_PROVIDER_SITE_OTHER): Payer: Medicare Other

## 2022-03-21 ENCOUNTER — Encounter: Payer: Self-pay | Admitting: Internal Medicine

## 2022-03-21 VITALS — BP 138/75 | HR 75 | Temp 97.3°F | Ht 60.0 in | Wt 129.0 lb

## 2022-03-21 VITALS — BP 138/75 | HR 75 | Temp 97.3°F | Resp 15 | Ht 60.0 in | Wt 129.6 lb

## 2022-03-21 DIAGNOSIS — I10 Essential (primary) hypertension: Secondary | ICD-10-CM

## 2022-03-21 DIAGNOSIS — Z0000 Encounter for general adult medical examination without abnormal findings: Secondary | ICD-10-CM

## 2022-03-21 DIAGNOSIS — E785 Hyperlipidemia, unspecified: Secondary | ICD-10-CM

## 2022-03-21 DIAGNOSIS — M545 Low back pain, unspecified: Secondary | ICD-10-CM

## 2022-03-21 DIAGNOSIS — M546 Pain in thoracic spine: Secondary | ICD-10-CM

## 2022-03-21 DIAGNOSIS — Z1231 Encounter for screening mammogram for malignant neoplasm of breast: Secondary | ICD-10-CM | POA: Diagnosis not present

## 2022-03-21 DIAGNOSIS — F411 Generalized anxiety disorder: Secondary | ICD-10-CM | POA: Diagnosis not present

## 2022-03-21 DIAGNOSIS — E871 Hypo-osmolality and hyponatremia: Secondary | ICD-10-CM

## 2022-03-21 LAB — COMPREHENSIVE METABOLIC PANEL
ALT: 15 U/L (ref 0–35)
AST: 23 U/L (ref 0–37)
Albumin: 4.3 g/dL (ref 3.5–5.2)
Alkaline Phosphatase: 54 U/L (ref 39–117)
BUN: 12 mg/dL (ref 6–23)
CO2: 30 mEq/L (ref 19–32)
Calcium: 9.7 mg/dL (ref 8.4–10.5)
Chloride: 96 mEq/L (ref 96–112)
Creatinine, Ser: 0.74 mg/dL (ref 0.40–1.20)
GFR: 75.2 mL/min (ref >60.00)
Glucose, Bld: 84 mg/dL (ref 70–99)
Potassium: 4.5 mEq/L (ref 3.5–5.1)
Sodium: 132 mEq/L — ABNORMAL LOW (ref 135–145)
Total Bilirubin: 0.6 mg/dL (ref 0.2–1.2)
Total Protein: 7.9 g/dL (ref 6.0–8.3)

## 2022-03-21 LAB — LIPID PANEL
Cholesterol: 155 mg/dL (ref 0–200)
HDL: 60 mg/dL (ref >39.00)
LDL Cholesterol: 83 mg/dL (ref 0–99)
NonHDL: 95.29
Total CHOL/HDL Ratio: 3
Triglycerides: 63 mg/dL (ref 0.0–149.0)
VLDL: 12.6 mg/dL (ref 0.0–40.0)

## 2022-03-21 LAB — LDL CHOLESTEROL, DIRECT: Direct LDL: 81 mg/dL

## 2022-03-21 MED ORDER — NYSTATIN 100000 UNIT/GM EX CREA: 1.0000 | TOPICAL_CREAM | Freq: Two times a day (BID) | CUTANEOUS | 0 refills | Status: DC

## 2022-03-21 NOTE — Progress Notes (Signed)
Subjective:  Patient ID: Holly Perez, female    DOB: Mar 02, 1941  Age: 81 y.o. MRN: [id number]  CC: The primary encounter diagnosis was Essential hypertension. Diagnoses of Hyperlipidemia, unspecified hyperlipidemia type, Back pain of thoracolumbar region, Chronic hyponatremia, Generalized anxiety disorder, and White coat syndrome with hypertension were also pertinent to this visit.   HPI Holly Perez presents for  Chief Complaint  Patient presents with   Medical Management of Chronic Issues    Hypertension, hypothyroidism   1) Hypertension: patient checks blood pressure twice weekly at home.  Readings have been for the most part  140/80 or lower at rest . Patient has chronic hyponatremia and supplements with salt to prevent  salt deterioration . She is taking amlodipine and losartan as prescribed   2)  recent breakout  of  rash under both breasts. Thinks it is due to her latex allergy,  Has been using hydrocortisone cream for the past 3 months and the rash has improved but not resolved.  The rash is not pruritic and does not hurt.   3) Holidays are anxiety producing for her since childhood because she was estranged from her mother. symptoms have  become more significant as she ages. She takes an occasional extra use of alprazolam as needed during the instead of just at night.    Outpatient Medications Prior to Visit  Medication Sig Dispense Refill   albuterol (PROVENTIL HFA;VENTOLIN HFA) 108 (90 BASE) MCG/ACT inhaler Inhale into the lungs every 6 (six) hours as needed for wheezing or shortness of breath.     ALPRAZolam (XANAX) 0.25 MG tablet Take 1 tablet (0.25 mg total) by mouth at bedtime as needed. 30 tablet 5   amLODipine (NORVASC) 10 MG tablet TAKE 1 TABLET(10 MG) BY MOUTH DAILY 90 tablet 1   aspirin 81 MG tablet Take 81 mg by mouth daily.     Calcium Carbonate-Vitamin D (CALTRATE 600+D PO) Take 1,200 mg by mouth daily.      cholecalciferol (VITAMIN D) 1000 UNITS tablet Take  2,000 Units by mouth daily.     fish oil-omega-3 fatty acids 1000 MG capsule Take 1 g by mouth daily.      Fluticasone-Salmeterol (ADVAIR) 250-50 MCG/DOSE AEPB Inhale 1 puff into the lungs every 12 (twelve) hours.     levothyroxine (SYNTHROID) 50 MCG tablet Take 50 mcg by mouth daily.     losartan (COZAAR) 50 MG tablet TAKE 1 TABLET(50 MG) BY MOUTH DAILY 90 tablet 1   Multiple Vitamin (MULTIVITAMIN) tablet Take 1 tablet by mouth daily.     Probiotic Product (PROBIOTIC ADVANCED PO) Take 1 capsule by mouth daily at 2 am.     traMADol (ULTRAM) 50 MG tablet TAKE 1 TABLET(50 MG) BY MOUTH TWICE DAILY FOR MODERATE PAIN 60 tablet 5   vitamin E 100 UNIT capsule Take 100 Units by mouth daily.     levothyroxine (SYNTHROID) 75 MCG tablet TAKE 1 TABLET(75 MCG) BY MOUTH DAILY BEFORE BREAKFAST (Patient not taking: Reported on 03/21/2022) 90 tablet 0   No facility-administered medications prior to visit.    Review of Systems;  Patient denies headache, fevers, malaise, unintentional weight loss, skin rash, eye pain, sinus congestion and sinus pain, sore throat, dysphagia,  hemoptysis , cough, dyspnea, wheezing, chest pain, palpitations, orthopnea, edema, abdominal pain, nausea, melena, diarrhea, constipation, flank pain, dysuria, hematuria, urinary  Frequency, nocturia, numbness, tingling, seizures,  Focal weakness, Loss of consciousness,  Tremor, insomnia, depression, anxiety, and suicidal ideation.  Objective:  BP 138/75   Pulse 75   Temp (!) 97.3 F (36.3 C) (Oral)   Ht 5' (1.524 m)   Wt 129 lb (58.5 kg)   SpO2 100%   BMI 25.19 kg/m   BP Readings from Last 3 Encounters:  03/21/22 138/75  03/21/22 138/75  09/13/21 140/78    Wt Readings from Last 3 Encounters:  03/21/22 129 lb (58.5 kg)  03/21/22 129 lb 9.6 oz (58.8 kg)  09/13/21 131 lb 6.4 oz (59.6 kg)    Physical Exam  Lab Results  Component Value Date   HGBA1C 5.9 09/13/2021    Lab Results  Component Value Date   CREATININE  0.74 03/21/2022   CREATININE 0.88 09/13/2021   CREATININE 0.78 03/14/2021    Lab Results  Component Value Date   WBC 4.4 09/13/2021   HGB 12.9 09/13/2021   HCT 39.5 09/13/2021   PLT 386.0 09/13/2021   GLUCOSE 84 03/21/2022   CHOL 155 03/21/2022   TRIG 63.0 03/21/2022   HDL 60.00 03/21/2022   LDLDIRECT 81.0 03/21/2022   LDLCALC 83 03/21/2022   ALT 15 03/21/2022   AST 23 03/21/2022   NA 132 (L) 03/21/2022   K 4.5 03/21/2022   CL 96 03/21/2022   CREATININE 0.74 03/21/2022   BUN 12 03/21/2022   CO2 30 03/21/2022   TSH 1.37 12/11/2021   HGBA1C 5.9 09/13/2021   MICROALBUR 23.1 (H) 09/13/2021    MM 3D SCREEN BREAST BILATERAL  Result Date: 05/14/2021 CLINICAL DATA:  Screening. EXAM: DIGITAL SCREENING BILATERAL MAMMOGRAM WITH TOMOSYNTHESIS AND CAD TECHNIQUE: Bilateral screening digital craniocaudal and mediolateral oblique mammograms were obtained. Bilateral screening digital breast tomosynthesis was performed. The images were evaluated with computer-aided detection. COMPARISON:  Previous exam(s). ACR Breast Density Category b: There are scattered areas of fibroglandular density. FINDINGS: There are no findings suspicious for malignancy. IMPRESSION: No mammographic evidence of malignancy. A result letter of this screening mammogram will be mailed directly to the patient. RECOMMENDATION: Screening mammogram in one year. (Code:SM-B-01Y) BI-RADS CATEGORY  1: Negative. Electronically Signed   By: Lillia Mountain M.D.   On: 05/14/2021 16:08   Assessment & Plan:  .Essential hypertension -     Comprehensive metabolic panel  Hyperlipidemia, unspecified hyperlipidemia type -     Lipid panel -     LDL cholesterol, direct  Back pain of thoracolumbar region Assessment & Plan: Managed with exercise,  Weight training,  Tylenol and prn Tramadol .  She has not had any ER visits  And has not requested any early refills.  Her Refill history was confirmed via [place] Controlled Substance database by me today  during her visit and there have been no prescriptions of controlled substances filled from any providers other than me. .    Chronic hyponatremia Assessment & Plan: Stable and present since 2011.  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  Managed with liberalization of  salt in diet and maintain good hydration with 60 ounces of water daily   Lab Results  Component Value Date   NA 132 (L) 03/21/2022   K 4.5 03/21/2022   CL 96 03/21/2022   CO2 30 03/21/2022      Generalized anxiety disorder Assessment & Plan: She has not tolerated trials of SSRIs .  She is using alprazolam responsibly and has not escalated use. The risks and benefits of benzodiazepine use were reviewed  with patient today including excessive sedation leading to respiratory depression,  impaired  thinking/driving, and addiction.  Patient was advised to avoid concurrent use with alcohol, to use medication only as needed and not to share with others  .    White coat syndrome with hypertension Assessment & Plan: Home readings are consistently < 130/80 on amlodipine and losartan.Marland Kitchen  No changes today  Lab Results  Component Value Date   CREATININE 0.74 03/21/2022   Lab Results  Component Value Date   NA 132 (L) 03/21/2022   K 4.5 03/21/2022   CL 96 03/21/2022   CO2 30 03/21/2022      Other orders -     Nystatin; Apply 1 Application topically 2 (two) times daily. To rash until resolved  Dispense: 30 g; Refill: 0     Follow-up: Return in about 6 months (around 09/20/2022).   Crecencio Mc, MD

## 2022-03-21 NOTE — Patient Instructions (Addendum)
Holly Perez , Thank you for taking time to come for your Medicare Wellness Visit. I appreciate your ongoing commitment to your health goals. Please review the following plan we discussed and let me know if I can assist you in the future.   These are the goals we discussed:    This is a list of the screening recommended for you and due dates:  Health Maintenance  Topic Date Due   COVID-19 Vaccine (5 - 2023-24 season) 04/06/2022*   Colon Cancer Screening  03/22/2023*   Mammogram  05/14/2022   Medicare Annual Wellness Visit  03/22/2023   DTaP/Tdap/Td vaccine (3 - Td or Tdap) 06/15/2023   Pneumonia Vaccine  Completed   Flu Shot  Completed   DEXA scan (bone density measurement)  Completed   Zoster (Shingles) Vaccine  Completed   HPV Vaccine  Aged Out  *Topic was postponed. The date shown is not the original due date.   Schedule mammogram for February by calling [phone]. [place].  Advanced directives: End of life planning; Advanced aging; Advanced directives discussed.  No HCPOA/Living Will.  Additional information declined at this time.  Conditions/risks identified: none new  Next appointment: Follow up in one year for your annual wellness visit    Preventive Care 65 Years and Older, Female Preventive care refers to lifestyle choices and visits with your health care provider that can promote health and wellness. What does preventive care include? A yearly physical exam. This is also called an annual well check. Dental exams once or twice a year. Routine eye exams. Ask your health care provider how often you should have your eyes checked. Personal lifestyle choices, including: Daily care of your teeth and gums. Regular physical activity. Eating a healthy diet. Avoiding tobacco and drug use. Limiting alcohol use. Practicing safe sex. Taking low-dose aspirin every day. Taking vitamin and mineral supplements as recommended by your health care provider. What  happens during an annual well check? The services and screenings done by your health care provider during your annual well check will depend on your age, overall health, lifestyle risk factors, and family history of disease. Counseling  Your health care provider may ask you questions about your: Alcohol use. Tobacco use. Drug use. Emotional well-being. Home and relationship well-being. Sexual activity. Eating habits. History of falls. Memory and ability to understand (cognition). Work and work Statistician. Reproductive health. Screening  You may have the following tests or measurements: Height, weight, and BMI. Blood pressure. Lipid and cholesterol levels. These may be checked every 5 years, or more frequently if you are over 50 years old. Skin check. Lung cancer screening. You may have this screening every year starting at age 38 if you have a 30-pack-year history of smoking and currently smoke or have quit within the past 15 years. Fecal occult blood test (FOBT) of the stool. You may have this test every year starting at age 5. Flexible sigmoidoscopy or colonoscopy. You may have a sigmoidoscopy every 5 years or a colonoscopy every 10 years starting at age 26. Hepatitis C blood test. Hepatitis B blood test. Sexually transmitted disease (STD) testing. Diabetes screening. This is done by checking your blood sugar (glucose) after you have not eaten for a while (fasting). You may have this done every 1-3 years. Bone density scan. This is done to screen for osteoporosis. You may have this done starting at age 41. Mammogram. This may be done every 1-2 years. Talk to your health care provider about how often  you should have regular mammograms. Talk with your health care provider about your test results, treatment options, and if necessary, the need for more tests. Vaccines  Your health care provider may recommend certain vaccines, such as: Influenza vaccine. This is recommended every  year. Tetanus, diphtheria, and acellular pertussis (Tdap, Td) vaccine. You may need a Td booster every 10 years. Zoster vaccine. You may need this after age 31. Pneumococcal 13-valent conjugate (PCV13) vaccine. One dose is recommended after age 53. Pneumococcal polysaccharide (PPSV23) vaccine. One dose is recommended after age 45. Talk to your health care provider about which screenings and vaccines you need and how often you need them. This information is not intended to replace advice given to you by your health care provider. Make sure you discuss any questions you have with your health care provider. Document Released: 04/14/2015 Document Revised: 12/06/2015 Document Reviewed: 01/17/2015 Elsevier Interactive Patient Education  2017 [place] Prevention in the Home Falls can cause injuries. They can happen to people of all ages. There are many things you can do to make your home safe and to help prevent falls. What can I do on the outside of my home? Regularly fix the edges of walkways and driveways and fix any cracks. Remove anything that might make you trip as you walk through a door, such as a raised step or threshold. Trim any bushes or trees on the path to your home. Use bright outdoor lighting. Clear any walking paths of anything that might make someone trip, such as rocks or tools. Regularly check to see if handrails are loose or broken. Make sure that both sides of any steps have handrails. Any raised decks and porches should have guardrails on the edges. Have any leaves, snow, or ice cleared regularly. Use sand or salt on walking paths during winter. Clean up any spills in your garage right away. This includes oil or grease spills. What can I do in the bathroom? Use night lights. Install grab bars by the toilet and in the tub and shower. Do not use towel bars as grab bars. Use non-skid mats or decals in the tub or shower. If you need to sit down in the shower, use a  plastic, non-slip stool. Keep the floor dry. Clean up any water that spills on the floor as soon as it happens. Remove soap buildup in the tub or shower regularly. Attach bath mats securely with double-sided non-slip rug tape. Do not have throw rugs and other things on the floor that can make you trip. What can I do in the bedroom? Use night lights. Make sure that you have a light by your bed that is easy to reach. Do not use any sheets or blankets that are too big for your bed. They should not hang down onto the floor. Have a firm chair that has side arms. You can use this for support while you get dressed. Do not have throw rugs and other things on the floor that can make you trip. What can I do in the kitchen? Clean up any spills right away. Avoid walking on wet floors. Keep items that you use a lot in easy-to-reach places. If you need to reach something above you, use a strong step stool that has a grab bar. Keep electrical cords out of the way. Do not use floor polish or wax that makes floors slippery. If you must use wax, use non-skid floor wax. Do not have throw rugs and other things on  the floor that can make you trip. What can I do with my stairs? Do not leave any items on the stairs. Make sure that there are handrails on both sides of the stairs and use them. Fix handrails that are broken or loose. Make sure that handrails are as long as the stairways. Check any carpeting to make sure that it is firmly attached to the stairs. Fix any carpet that is loose or worn. Avoid having throw rugs at the top or bottom of the stairs. If you do have throw rugs, attach them to the floor with carpet tape. Make sure that you have a light switch at the top of the stairs and the bottom of the stairs. If you do not have them, ask someone to add them for you. What else can I do to help prevent falls? Wear shoes that: Do not have high heels. Have rubber bottoms. Are comfortable and fit you  well. Are closed at the toe. Do not wear sandals. If you use a stepladder: Make sure that it is fully opened. Do not climb a closed stepladder. Make sure that both sides of the stepladder are locked into place. Ask someone to hold it for you, if possible. Clearly mark and make sure that you can see: Any grab bars or handrails. First and last steps. Where the edge of each step is. Use tools that help you move around (mobility aids) if they are needed. These include: Canes. Walkers. Scooters. Crutches. Turn on the lights when you go into a dark area. Replace any light bulbs as soon as they burn out. Set up your furniture so you have a clear path. Avoid moving your furniture around. If any of your floors are uneven, fix them. If there are any pets around you, be aware of where they are. Review your medicines with your doctor. Some medicines can make you feel dizzy. This can increase your chance of falling. Ask your doctor what other things that you can do to help prevent falls. This information is not intended to replace advice given to you by your health care provider. Make sure you discuss any questions you have with your health care provider. Document Released: 01/12/2009 Document Revised: 08/24/2015 Document Reviewed: 04/22/2014 Elsevier Interactive Patient Education  2017 [place] A mammogram is an X-ray of the breasts. This procedure can screen for and detect any changes that may indicate breast cancer. Mammograms are regularly done beginning at age 62 for women with average risk. A man may have a mammogram if he has a lump or swelling in his breast tissue. A mammogram can also identify other changes and variations in the breast, such as: Inflammation of the breast tissue (mastitis). An infected area that contains a collection of pus (abscess). A fluid-filled sac (cyst). Tumors that are not cancerous (benign). Fibrocystic changes. This is when breast tissue becomes  denser and can make the tissue feel rope-like or uneven under the skin. Women at higher risk for breast cancer need earlier and more comprehensive screening for abnormal changes. Breast tomosynthesis, or three-dimensional (3D) mammography, and digital breast tomosynthesis are advanced forms of imaging that create 3D pictures of the breasts. Tell a health care provider: About any allergies you have. If you have breast implants. If you have had previous breast disease, biopsy, or surgery. If you have a family history of breast cancer. If you are breastfeeding. Whether you are pregnant or may be pregnant. What are the risks? Generally, this is a  safe procedure. However, problems may occur, including: Exposure to radiation. Radiation levels are very low with this test. The need for more tests. The mammogram fails to detect certain cancers or the results are misinterpreted. Difficulty with detecting breast cancer in women with dense breasts. What happens before the procedure? Schedule your test about 1-2 weeks after your menstrual period if you are menstruating. This is usually when your breasts are the least tender. If you have had a mammogram done at a different facility in the past, get the mammogram X-rays or have them sent to your current exam facility. The new and old images will be compared. Wash your breasts and underarms on the day of the test. Do not wear deodorants, perfumes, lotions, or powders anywhere on your body on the day of the test. Remove any jewelry from your neck. Wear clothes that you can change into and out of easily. What happens during the procedure?  You will undress from the waist up and put on a gown that opens in the front. You will stand in front of the X-ray machine. Each breast will be placed between two plastic or glass plates. The plates will compress your breast for a few seconds. Try to stay as relaxed as possible. This procedure does not cause any harm to  your breasts. Any discomfort you feel will be very brief. X-rays will be taken from different angles of each breast. The procedure may vary among health care providers and hospitals. What can I expect after the procedure? The mammogram will be examined by a specialist (radiologist). You may need to repeat certain parts of the test, depending on the quality of the images. This is done if the radiologist needs a better view of the breast tissue. You may resume your normal activities. It is up to you to get the results of your procedure. Ask your health care provider, or the department that is doing the procedure, when your results will be ready. Summary A mammogram is an X-ray of the breasts. This procedure can screen for and detect any changes that may indicate breast cancer. A man may have a mammogram if he has a lump or swelling in his breast tissue. If you have had a mammogram done at a different facility in the past, get the mammogram X-rays or have them sent to your current exam facility in order to compare them. Schedule your test about 1-2 weeks after your menstrual period if you are menstruating. Ask when your test results will be ready. Make sure you get your test results. This information is not intended to replace advice given to you by your health care provider. Make sure you discuss any questions you have with your health care provider. Document Revised: 11/29/2020 Document Reviewed: 01/17/2020 Elsevier Patient Education  [place].

## 2022-03-21 NOTE — Patient Instructions (Signed)
Your rash may be due to a mild yeast infection  I have prescribed nystatin cream to use twice daily for the next 1-2 weeks  If the rash does not resolve,  send me a message and I will add a stronger steroid cream  Once the rash resolves,  use [employer] Medicated powder with zinc under each breast after you bathe to PREVENT recurrence

## 2022-03-21 NOTE — Progress Notes (Addendum)
Subjective:   Holly Perez is a 81 y.o. female who presents for Medicare Annual (Subsequent) preventive examination.  Review of Systems    No ROS.  Medicare Wellness.   Cardiac Risk Factors include: advanced age (>65mn, >>62women)     Objective:    Today's Vitals   03/21/22 0847  BP: 138/75  Pulse: 75  Resp: 15  Temp: (!) 97.3 F (36.3 C)  SpO2: 100%  Weight: 129 lb 9.6 oz (58.8 kg)  Height: 5' (1.524 m)   Body mass index is 25.31 kg/m.     03/21/2022    9:05 AM 02/15/2021   10:13 AM 09/21/2019   10:56 AM 02/12/2019   10:22 AM 01/14/2018    7:10 AM 12/26/2016    2:16 PM 12/26/2015   10:14 AM  Advanced Directives  Does Patient Have a Medical Advance Directive? No No No No Yes No No  Type of APersonnel officerLiving will    Copy of HCrookin Chart?     No - copy requested    Would patient like information on creating a medical advance directive? No - Patient declined No - Patient declined No - Patient declined No - Patient declined  No - Patient declined No - patient declined information    Current Medications (verified) Outpatient Encounter Medications as of 03/21/2022  Medication Sig   albuterol (PROVENTIL HFA;VENTOLIN HFA) 108 (90 BASE) MCG/ACT inhaler Inhale into the lungs every 6 (six) hours as needed for wheezing or shortness of breath.   ALPRAZolam (XANAX) 0.25 MG tablet Take 1 tablet (0.25 mg total) by mouth at bedtime as needed.   amLODipine (NORVASC) 10 MG tablet TAKE 1 TABLET(10 MG) BY MOUTH DAILY   aspirin 81 MG tablet Take 81 mg by mouth daily.   Calcium Carbonate-Vitamin D (CALTRATE 600+D PO) Take 1,200 mg by mouth daily.    cholecalciferol (VITAMIN D) 1000 UNITS tablet Take 2,000 Units by mouth daily.   fish oil-omega-3 fatty acids 1000 MG capsule Take 1 g by mouth daily.    Fluticasone-Salmeterol (ADVAIR) 250-50 MCG/DOSE AEPB Inhale 1 puff into the lungs every 12 (twelve) hours.    levothyroxine (SYNTHROID) 75 MCG tablet TAKE 1 TABLET(75 MCG) BY MOUTH DAILY BEFORE BREAKFAST   losartan (COZAAR) 50 MG tablet TAKE 1 TABLET(50 MG) BY MOUTH DAILY   Multiple Vitamin (MULTIVITAMIN) tablet Take 1 tablet by mouth daily.   Probiotic Product (PROBIOTIC ADVANCED PO) Take 1 capsule by mouth daily at 2 am.   traMADol (ULTRAM) 50 MG tablet TAKE 1 TABLET(50 MG) BY MOUTH TWICE DAILY FOR MODERATE PAIN   vitamin E 100 UNIT capsule Take 100 Units by mouth daily.   No facility-administered encounter medications on file as of 03/21/2022.    Allergies (verified) Augmentin [amoxicillin-pot clavulanate] and Latex   History: Past Medical History:  Diagnosis Date   Asthma    Colon polyp 03/07/2010   2 mm tubular adenoma the ascending colon. Dr. IDionne Milo    Hemorrhoids    Hypertension    Irritable bowel syndrome (IBS)    Past Surgical History:  Procedure Laterality Date   ABDOMINAL HYSTERECTOMY     COLONOSCOPY  2011   Dr. IErnst Breach 276mtubular adenoma of the ascending colon without dysplasia or malignancy.   COLONOSCOPY WITH PROPOFOL N/A 01/14/2018   Procedure: COLONOSCOPY WITH PROPOFOL;  Surgeon: ByRobert BellowMD;  Location: [facility] ENDOSCOPY;  Service: Endoscopy;  Laterality: N/A;  ECTOPIC PREGNANCY SURGERY     at age 52   [place]   tumor   Family History  Problem Relation Age of Onset   Early death Mother    COPD Father    Early death Brother    Breast cancer Neg Hx    Social History   Socioeconomic History   Marital status: Divorced    Spouse name: Not on file   Number of children: Not on file   Years of education: Not on file   Highest education level: Not on file  Occupational History   Not on file  Tobacco Use   Smoking status: Never   Smokeless tobacco: Never  Substance and Sexual Activity   Alcohol use: No   Drug use: No   Sexual activity: Never  Other Topics Concern   Not on file  Social History Narrative   Not on file   Social  Determinants of Health   Financial Resource Strain: Low Risk  (03/21/2022)   Overall Financial Resource Strain (CARDIA)    Difficulty of Paying Living Expenses: Not hard at all  Food Insecurity: No Food Insecurity (03/21/2022)   Hunger Vital Sign    Worried About Running Out of Food in the Last Year: Never true    [place] in the Last Year: Never true  Transportation Needs: No Transportation Needs (03/21/2022)   PRAPARE - Hydrologist (Medical): No    Lack of Transportation (Non-Medical): No  Physical Activity: Sufficiently Active (03/21/2022)   Exercise Vital Sign    Days of Exercise per Week: 7 days    Minutes of Exercise per Session: 90 min  Stress: No Stress Concern Present (03/21/2022)   [place]    Feeling of Stress : Not at all  Social Connections: Unknown (03/21/2022)   Social Connection and Isolation Panel [NHANES]    Frequency of Communication with Friends and Family: More than three times a week    Frequency of Social Gatherings with Friends and Family: More than three times a week    Attends Religious Services: 1 to 4 times per year    Active Member of [employer] or Organizations: Yes    Attends Archivist Meetings: Not on file    Marital Status: Not on file    Tobacco Counseling Counseling given: Not Answered   Clinical Intake:  Pre-visit preparation completed: Yes        Diabetes: No  How often do you need to have someone help you when you read instructions, pamphlets, or other written materials from your doctor or pharmacy?: 1 - Never    Interpreter Needed?: No      Activities of Daily Living    03/21/2022    8:53 AM  In your present state of health, do you have any difficulty performing the following activities:  Hearing? 0  Vision? 0  Difficulty concentrating or making decisions? 0  Walking or climbing stairs? 0  Dressing or  bathing? 0  Doing errands, shopping? 0  Preparing Food and eating ? N  Using the Toilet? N  In the past six months, have you accidently leaked urine? N  Do you have problems with loss of bowel control? N  Managing your Medications? N  Managing your Finances? N  Housekeeping or managing your Housekeeping? N    Patient Care Team: Crecencio Mc, MD as PCP - General (Internal Medicine) Derrel Nip,  Aris Everts, MD (Internal Medicine) Bary Castilla Forest Gleason, MD (General Surgery)  Indicate any recent Medical Services you may have received from other than [facility] providers in the past year (date may be approximate).     Assessment:   This is a routine wellness examination for [place].  Hearing/Vision screen Hearing Screening - Comments:: Patient is able to hear conversational tones without difficulty.  No issues reported.   Vision Screening - Comments:: Followed by [facility] Wears corrective lenses They have regular follow up with the ophthalmologist    Dietary issues and exercise activities discussed: Current Exercise Habits: Home exercise routine, Type of exercise: walking;treadmill;strength training/weights, Time (Minutes): 60, Frequency (Times/Week): 3, Weekly Exercise (Minutes/Week): 180, Intensity: Mild Regular diet   Goals Addressed               This Visit's Progress     Patient Stated     Maintain Healthy Lifestyle (pt-stated)   On track     Keep walking routine, stay active Healthy diet Stay hydrated       Depression Screen    03/21/2022    9:00 AM 09/13/2021   11:07 AM 05/31/2021    8:10 AM 03/14/2021   10:28 AM 02/15/2021   10:09 AM 09/07/2020   10:02 AM 02/15/2020    9:44 AM  PHQ 2/9 Scores  PHQ - 2 Score 2 2 0 1 0 1 0  PHQ- 9 Score 4 3 0 1  7     Fall Risk    03/21/2022    9:03 AM 09/13/2021    9:32 AM 05/31/2021    8:10 AM 03/14/2021    9:37 AM 02/15/2021   10:14 AM  Fall Risk   Falls in the past year? 1 0 0 0 0  Number falls in past yr: 0  0     Injury with Fall? 0  0  0  Risk for fall due to : No Fall Risks No Fall Risks No Fall Risks No Fall Risks   Follow up Falls evaluation completed;Falls prevention discussed Falls evaluation completed Falls evaluation completed Falls evaluation completed Falls evaluation completed    FALL RISK PREVENTION PERTAINING TO THE HOME: Home free of loose throw rugs in walkways, pet beds, electrical cords, etc? Yes  Adequate lighting in your home to reduce risk of falls? Yes   ASSISTIVE DEVICES UTILIZED TO PREVENT FALLS: Life alert? No  Use of a cane, walker or w/c? No  Grab bars in the bathroom? No  Shower chair or bench in shower? Yes  Elevated toilet seat or a handicapped toilet? No   TIMED UP AND GO: Was the test performed? No .   Cognitive Function:    02/04/2018   10:44 AM 12/26/2016    2:22 PM 12/26/2015   10:17 AM  MMSE - Mini Mental State Exam  Orientation to time '5 5 5  '$ Orientation to Place '5 5 5  '$ Registration '3 3 3  '$ Attention/ Calculation '5 5 5  '$ Recall '3 2 3  '$ Language- name 2 objects '2 2 2  '$ Language- repeat '1 1 1  '$ Language- follow 3 step command '3 3 3  '$ Language- read & follow direction '1 1 1  '$ Write a sentence '1 1 1  '$ Copy design '1 1 1  '$ Total score '30 29 30        '$ 03/21/2022    9:03 AM 02/15/2020    9:48 AM 02/12/2019   10:14 AM  6CIT Screen  What Year? 0  points 0 points 0 points  What month? 0 points 0 points 0 points  What time? 0 points 0 points 0 points  Count back from 20 0 points 0 points 0 points  Months in reverse 0 points 0 points 0 points  Repeat phrase 0 points 0 points 0 points  Total Score 0 points 0 points 0 points    Immunizations Immunization History  Administered Date(s) Administered   Fluad Quad(high Dose 65+) 02/17/2019, 01/02/2021, 12/20/2021   Influenza, High Dose Seasonal PF 12/26/2016, 01/05/2018, 02/01/2020   Influenza,inj,Quad PF,6+ Mos 12/18/2012, 01/06/2014, 12/01/2014, 12/26/2015   Moderna Covid-19 Vaccine Bivalent Booster 19yr &  up 03/01/2021   PFIZER(Purple Top)SARS-COV-2 Vaccination 05/27/2019, 06/22/2019, 01/15/2020   PPD Test 12/16/2012, 11/08/2014, 10/31/2015, 11/05/2016, 10/07/2017   Pneumococcal Conjugate-13 06/15/2015   Pneumococcal Polysaccharide-23 07/15/2004, 05/06/2018   Td 06/14/2013   Tdap 06/14/2013   Zoster Recombinat (Shingrix) 03/01/2021, 05/17/2021   Zoster, Live 06/08/2010   Covid-19 vaccine status: Completed vaccines x4.   Shingrix Completed?: No.    Education has been provided regarding the importance of this vaccine. Patient has been advised to call insurance company to determine out of pocket expense if they have not yet received this vaccine. Advised may also receive vaccine at local pharmacy or Health Dept. Verbalized acceptance and understanding.  Screening Tests Health Maintenance  Topic Date Due   COVID-19 Vaccine (5 - 2023-24 season) 04/06/2022 (Originally 11/30/2021)   COLONOSCOPY (Pts [phone] coverage will need to be confirmed)  03/22/2023 (Originally 01/14/2021)   MAMMOGRAM  05/14/2022   Medicare Annual Wellness (AWV)  03/22/2023   DTaP/Tdap/Td (3 - Td or Tdap) 06/15/2023   Pneumonia Vaccine 6522Years old  Completed   INFLUENZA VACCINE  Completed   DEXA SCAN  Completed   Zoster Vaccines- Shingrix  Completed   HPV VACCINES  Aged Out   Health Maintenance There are no preventive care reminders to display for this patient.  Colonoscopy- declined per patient.   Mammogram- ordered per consent.   Lung Cancer Screening: (Low Dose CT Chest recommended if Age 82-80ears, 30 pack-year currently smoking OR have quit w/in 15years.) does not qualify.   Hepatitis C Screening: does not qualify.  Vision Screening: Recommended annual ophthalmology exams for early detection of glaucoma and other disorders of the eye.  Dental Screening: Recommended annual dental exams for proper oral hygiene  Community Resource Referral / Chronic Care Management: CRR required this visit?  No    CCM required this visit?  No      Plan:     I have personally reviewed and noted the following in the patient's chart:   Medical and social history Use of alcohol, tobacco or illicit drugs  Current medications and supplements including opioid prescriptions. Patient is currently taking opioid prescriptions. Information provided to patient regarding non-opioid alternatives. Patient advised to discuss non-opioid treatment plan with their provider. Functional ability and status Nutritional status Physical activity Advanced directives List of other physicians Hospitalizations, surgeries, and ER visits in previous 12 months Vitals Screenings to include cognitive, depression, and falls Referrals and appointments  In addition, I have reviewed and discussed with patient certain preventive protocols, quality metrics, and best practice recommendations. A written personalized care plan for preventive services as well as general preventive health recommendations were provided to patient.     [place]LPN   [id number]    I have reviewed the above information and agree with above.   TeDeborra MedinaMD

## 2022-03-23 NOTE — Assessment & Plan Note (Signed)
Home readings are consistently < 130/80 on amlodipine and losartan.Marland Kitchen  No changes today  Lab Results  Component Value Date   CREATININE 0.74 03/21/2022   Lab Results  Component Value Date   NA 132 (L) 03/21/2022   K 4.5 03/21/2022   CL 96 03/21/2022   CO2 30 03/21/2022

## 2022-03-23 NOTE — Assessment & Plan Note (Signed)
She has not tolerated trials of SSRIs .  She is using alprazolam responsibly and has not escalated use. The risks and benefits of benzodiazepine use were reviewed  with patient today including excessive sedation leading to respiratory depression,  impaired thinking/driving, and addiction.  Patient was advised to avoid concurrent use with alcohol, to use medication only as needed and not to share with others  .

## 2022-03-23 NOTE — Assessment & Plan Note (Signed)
Managed with exercise,  Weight training,  Tylenol and prn Tramadol .  She has not had any ER visits  And has not requested any early refills.  Her Refill history was confirmed via [place] Controlled Substance database by me today during her visit and there have been no prescriptions of controlled substances filled from any providers other than me. Marland Kitchen

## 2022-03-23 NOTE — Assessment & Plan Note (Signed)
Stable and present since 2011.  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  Managed with liberalization of  salt in diet and maintain good hydration with 60 ounces of water daily   Lab Results  Component Value Date   NA 132 (L) 03/21/2022   K 4.5 03/21/2022   CL 96 03/21/2022   CO2 30 03/21/2022

## 2022-03-27 ENCOUNTER — Telehealth: Payer: Self-pay | Admitting: Internal Medicine

## 2022-03-27 NOTE — Telephone Encounter (Signed)
Spoke with pt and informed her of her lab results. Pt gave a verbal understanding.  

## 2022-03-27 NOTE — Telephone Encounter (Signed)
Pt called staying that she would like to know labs result from last week. She unable to login to MyChart because she dropped her phone. She's available at [phone] until tomorrow 1pm.

## 2022-04-04 ENCOUNTER — Other Ambulatory Visit: Payer: Self-pay | Admitting: Internal Medicine

## 2022-04-04 NOTE — Telephone Encounter (Signed)
Is it okay to refuse medication? It was discontinued on 03/21/2022.

## 2022-04-22 ENCOUNTER — Telehealth: Payer: Self-pay | Admitting: Internal Medicine

## 2022-04-22 MED ORDER — TRAMADOL HCL 50 MG PO TABS: ORAL_TABLET | ORAL | 5 refills | Status: DC

## 2022-04-22 NOTE — Telephone Encounter (Signed)
Prescription Request  04/22/2022  Is this a "Controlled Substance" medicine? No  LOV: 03/21/2022  What is the name of the medication or equipment? traMADol (ULTRAM) 50 MG tablet  Have you contacted your pharmacy to request a refill? Yes   Which pharmacy would you like this sent to?   [facility] [place], [address] AT [address] [place] [address] Phone: [phone] Fax: [phone]    Patient notified that their request is being sent to the clinical staff for review and that they should receive a response within 2 business days.   Please advise at [place]

## 2022-04-22 NOTE — Telephone Encounter (Signed)
Requesting Tramadol refill  Refilled: 10/11/2021 Last OV: 03/21/2022 Next OV: 09/26/2022

## 2022-04-22 NOTE — Telephone Encounter (Signed)
Pt is aware that medication has been refilled.  °

## 2022-05-18 ENCOUNTER — Ambulatory Visit
Admission: EM | Admit: 2022-05-18 | Discharge: 2022-05-18 | Disposition: A | Discharge status: Home / Self Care | Payer: Medicare Other | Attending: Urgent Care | Admitting: Urgent Care

## 2022-05-18 DIAGNOSIS — Z20822 Contact with and (suspected) exposure to covid-19: Secondary | ICD-10-CM | POA: Insufficient documentation

## 2022-05-18 NOTE — ED Triage Notes (Signed)
Patient presents to [facility] for Covid testing. States she was exposed Monday. She has negative at home tests. Wants a PCR to confirm.

## 2022-05-18 NOTE — Discharge Instructions (Signed)
Follow up here or with your primary care provider if your symptoms are worsening or not improving.     

## 2022-05-18 NOTE — ED Provider Notes (Signed)
[facility]    CSN: [id number] Arrival date & time: 05/18/22  1148      History   Chief Complaint Chief Complaint  Patient presents with   covid testing     HPI Holly Perez is a 82 y.o. female.   HPI  Patient presents to urgent care to request COVID testing following an exposure 5 days ago.  She has had multiple negative at home tests and requests PCR test.  She endorses a general feeling of unwellness but has no specific complaints.  She has elevated blood pressure.  She is taking her prescribed medication but states she feels very agitated because of her exposure.  Past Medical History:  Diagnosis Date   Asthma    Colon polyp 03/07/2010   2 mm tubular adenoma the ascending colon. Dr. Dionne Milo.    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   Chronic hyponatremia 09/04/2019   Microalbuminuria 05/16/2017   Back pain of thoracolumbar region 07/03/2014   White coat syndrome with hypertension 06/30/2014   Diverticulosis of colon without hemorrhage 06/08/2013   History of colonic polyps 06/08/2013   S/P total hysterectomy 06/08/2013   Generalized anxiety disorder 11/27/2012   Breast cancer screening by mammogram 05/31/2012   Acquired hypothyroidism 10/30/2011   Asthma 10/30/2011   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 Ernst Breach: 31m tubular adenoma of the ascending colon without dysplasia or malignancy.   COLONOSCOPY WITH PROPOFOL N/A 01/14/2018   Procedure: COLONOSCOPY WITH PROPOFOL;  Surgeon: BRobert Bellow MD;  Location: [facility] ENDOSCOPY;  Service: Endoscopy;  Laterality: N/A;    ECTOPIC PREGNANCY SURGERY     at age 82  [place]  tumor    OB History     Gravida  1   Para  0   Term      Preterm      AB      Living         SAB      IAB      Ectopic      Multiple      Live Births           Obstetric Comments  1st Menstrual Cycle:  13 1st Pregnancy:  89Menopause age 82         Home Medications    Prior to Admission medications   Medication Sig Start Date End Date Taking? Authorizing Provider  albuterol (PROVENTIL HFA;VENTOLIN HFA) 108 (90 BASE) MCG/ACT inhaler Inhale into the lungs every 6 (six) hours as needed for wheezing or shortness of breath.    [provider]  ALPRAZolam (Duanne Moron 0.25 MG tablet Take 1 tablet (0.25 mg total) by mouth at bedtime as needed. 03/16/22   TCrecencio Mc MD  amLODipine (NORVASC) 10 MG tablet TAKE 1 TABLET(10 MG) BY MOUTH DAILY 02/28/22   TCrecencio Mc MD  aspirin 81 MG tablet Take 81 mg by mouth daily.    [provider]  Calcium Carbonate-Vitamin D (CALTRATE 600+D PO) Take 1,200 mg by mouth daily.     [provider]  cholecalciferol (VITAMIN D) 1000 UNITS tablet Take 2,000 Units by mouth daily.    [provider]  fish oil-omega-3 fatty acids 1000 MG capsule Take 1 g by mouth daily.     [provider]  Fluticasone-Salmeterol (ADVAIR) 250-50 MCG/DOSE AEPB Inhale 1 puff into the lungs every 12 (twelve) hours.    [provider]  levothyroxine (SYNTHROID) 50 MCG tablet Take 50 mcg by mouth daily.    [provider]  losartan (COZAAR) 50 MG tablet TAKE 1 TABLET(50 MG) BY MOUTH DAILY 02/04/22   Crecencio Mc, MD  Multiple Vitamin (MULTIVITAMIN) tablet Take 1 tablet by mouth daily.    [provider]  nystatin cream (MYCOSTATIN) Apply 1 Application topically 2 (two) times daily. To rash until resolved 03/21/22   Crecencio Mc, MD  Probiotic Product (PROBIOTIC ADVANCED PO) Take 1 capsule by mouth daily at 2 am.     [provider]  traMADol (ULTRAM) 50 MG tablet TAKE 1 TABLET(50 MG) BY MOUTH TWICE DAILY FOR MODERATE PAINTAKE 1 TABLET(50 MG) BY MOUTH TWICE DAILY FOR MODERATE PAIN 04/22/22   Crecencio Mc, MD  vitamin E 100 UNIT capsule Take 100 Units by mouth daily.    [provider]    Family History Family History  Problem Relation Age of Onset   Early death Mother    COPD Father    Early death Brother    Breast cancer Neg Hx     Social History Social History   Tobacco Use   Smoking status: Never   Smokeless tobacco: Never  Substance Use Topics   Alcohol use: No   Drug use: No     Allergies   Augmentin [amoxicillin-pot clavulanate] and Latex   Review of Systems Review of Systems   Physical Exam Triage Vital Signs ED Triage Vitals  Enc Vitals Group     BP 05/18/22 1214 (!) 184/77     Pulse Rate 05/18/22 1214 82     Resp 05/18/22 1214 16     Temp 05/18/22 1214 99.1 F (37.3 C)     Temp Source 05/18/22 1214 Oral     SpO2 05/18/22 1214 97 %     Weight --      Height --      Head Circumference --      Peak Flow --      Pain Score 05/18/22 1215 0     Pain Loc --      Pain Edu? --      Excl. in [place]? --    No data found.  Updated Vital Signs BP (!) 184/77 (BP Location: Left Arm)   Pulse 82   Temp 99.1 F (37.3 C) (Oral)   Resp 16   SpO2 97%   Visual Acuity Right Eye Distance:   Left Eye Distance:   Bilateral Distance:    Right Eye Near:   Left Eye Near:    Bilateral Near:     Physical Exam Vitals reviewed.  Constitutional:      Appearance: Normal appearance.  HENT:     Mouth/Throat:     Mouth: Mucous membranes are moist.     Pharynx: No oropharyngeal exudate or posterior oropharyngeal erythema.  Eyes:     Conjunctiva/sclera: Conjunctivae normal.     Pupils: Pupils are equal, round, and reactive to light.  Cardiovascular:     Rate and Rhythm: Normal rate and regular rhythm.     Pulses: Normal pulses.  Heart sounds: Normal heart  sounds.  Pulmonary:     Effort: Pulmonary effort is normal.     Breath sounds: Normal breath sounds.  Skin:    General: Skin is warm and dry.  Neurological:     General: No focal deficit present.     Mental Status: She is alert and oriented to person, place, and time.  Psychiatric:        Mood and Affect: Mood normal.        Behavior: Behavior normal.      UC Treatments / Results  Labs (all labs ordered are listed, but only abnormal results are displayed) Labs Reviewed  SARS CORONAVIRUS 2 (TAT 6-24 HRS)    EKG   Radiology No results found.  Procedures Procedures (including critical care time)  Medications Ordered in UC Medications - No data to display  Initial Impression / Assessment and Plan / UC Course  I have reviewed the triage vital signs and the nursing notes.  Pertinent labs & imaging results that were available during my care of the patient were reviewed by me and considered in my medical decision making (see chart for details).   Patient is afebrile here without recent antipyretics. Satting well on room air. Overall is well appearing, well hydrated, without respiratory distress. Pulmonary exam is unremarkable.  Lungs CTAB without wheezing, rhonchi, rales.  COVID swab obtained and results pending.  Final Clinical Impressions(s) / UC Diagnoses   Final diagnoses:  Exposure to COVID-19 virus     Discharge Instructions      Follow up here or with your primary care provider if your symptoms are worsening or not improving.      ED Prescriptions   None    PDMP not reviewed this encounter.   Rose Phi, Post 05/18/22 1231

## 2022-05-19 LAB — SARS CORONAVIRUS 2 (TAT 6-24 HRS): SARS Coronavirus 2: POSITIVE — AB

## 2022-05-31 IMAGING — MG MM DIGITAL SCREENING BILAT W/ TOMO AND CAD
6 of 10 series · 6 of 30 positions shown · non-contrast
Comparison: Previous exam(s).

CLINICAL DATA: Screening.

EXAM:
DIGITAL SCREENING BILATERAL MAMMOGRAM WITH TOMOSYNTHESIS AND CAD
TECHNIQUE: Bilateral screening digital craniocaudal and mediolateral oblique
mammograms were obtained. Bilateral screening digital breast
tomosynthesis was performed. The images were evaluated with
computer-aided detection.

[R CC synth-2D (1 of 2)]
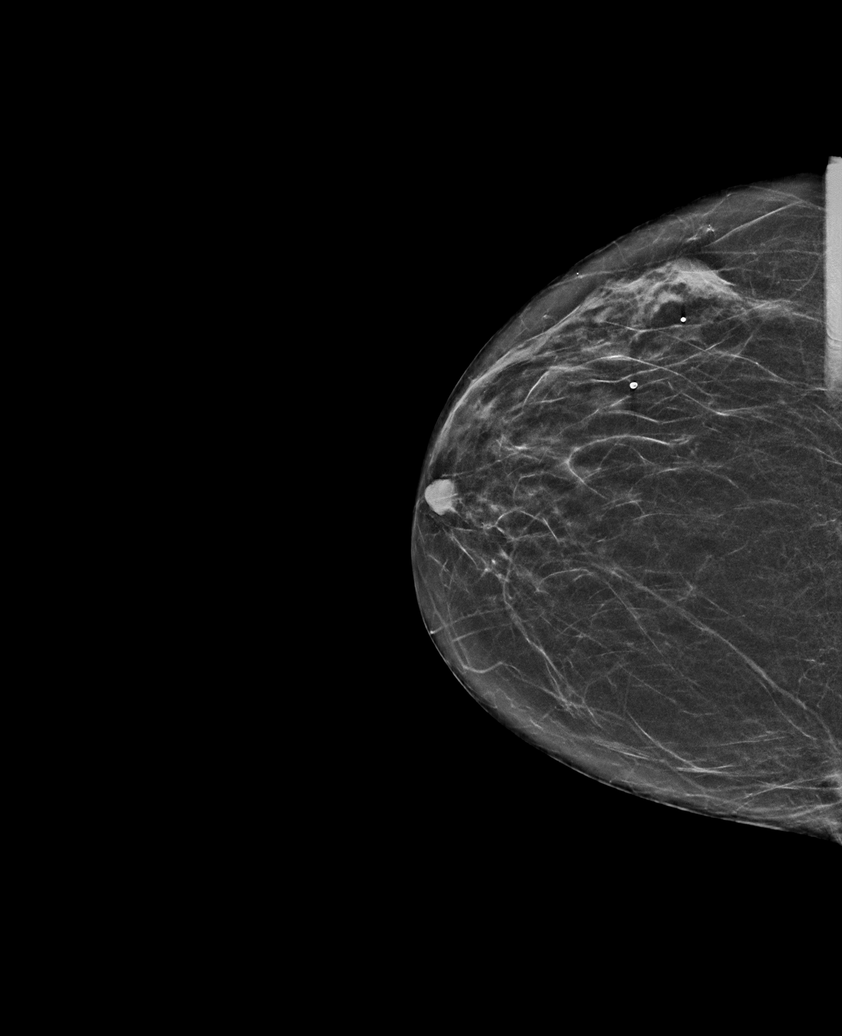

[R CC synth-2D (2 of 2)]
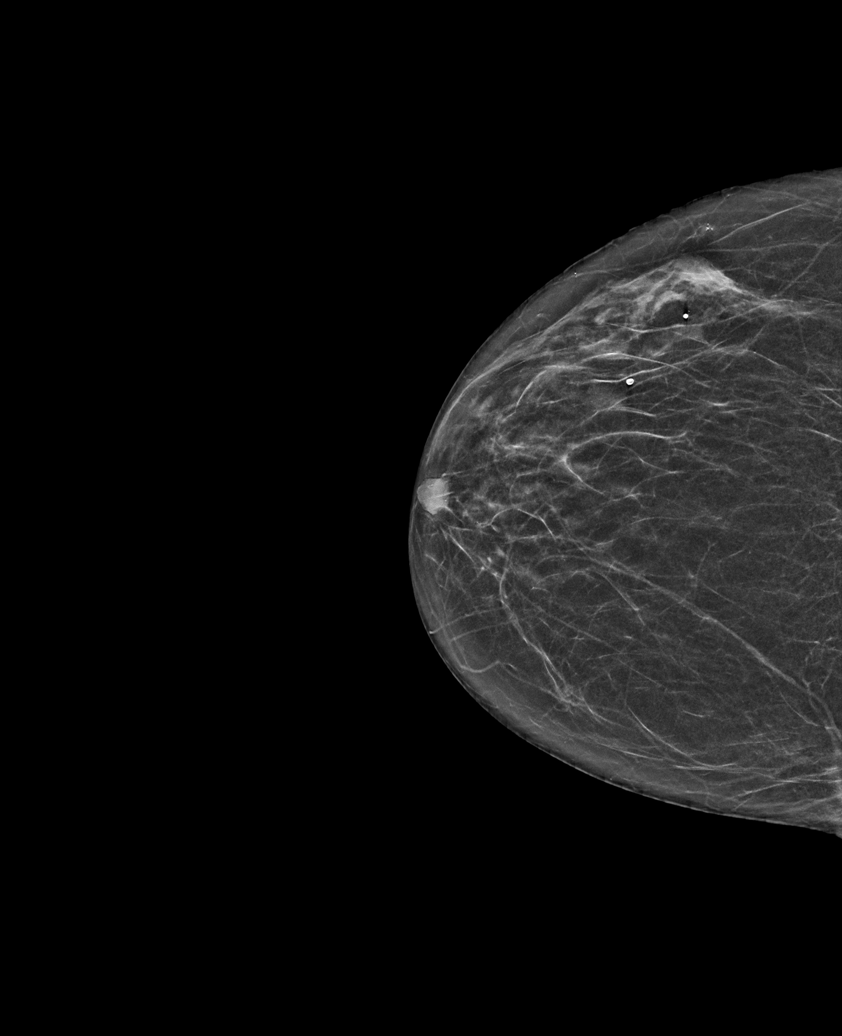

[L MLO synth-2D]
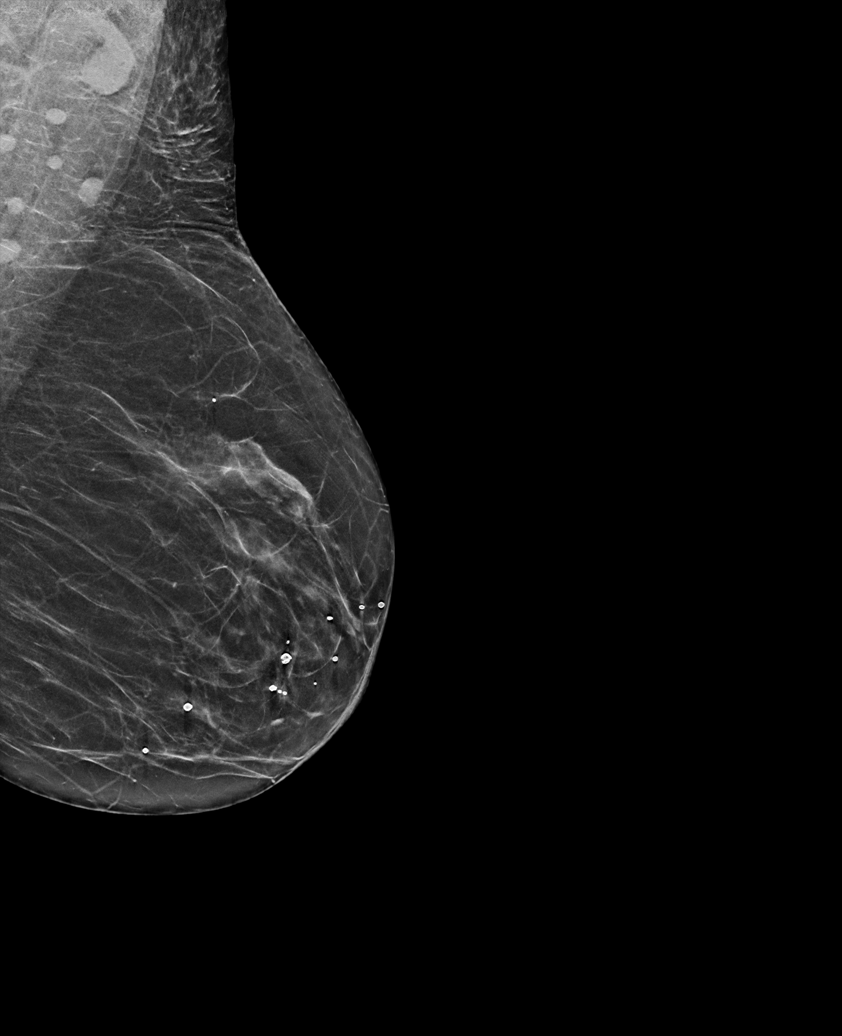

[R MLO synth-2D]
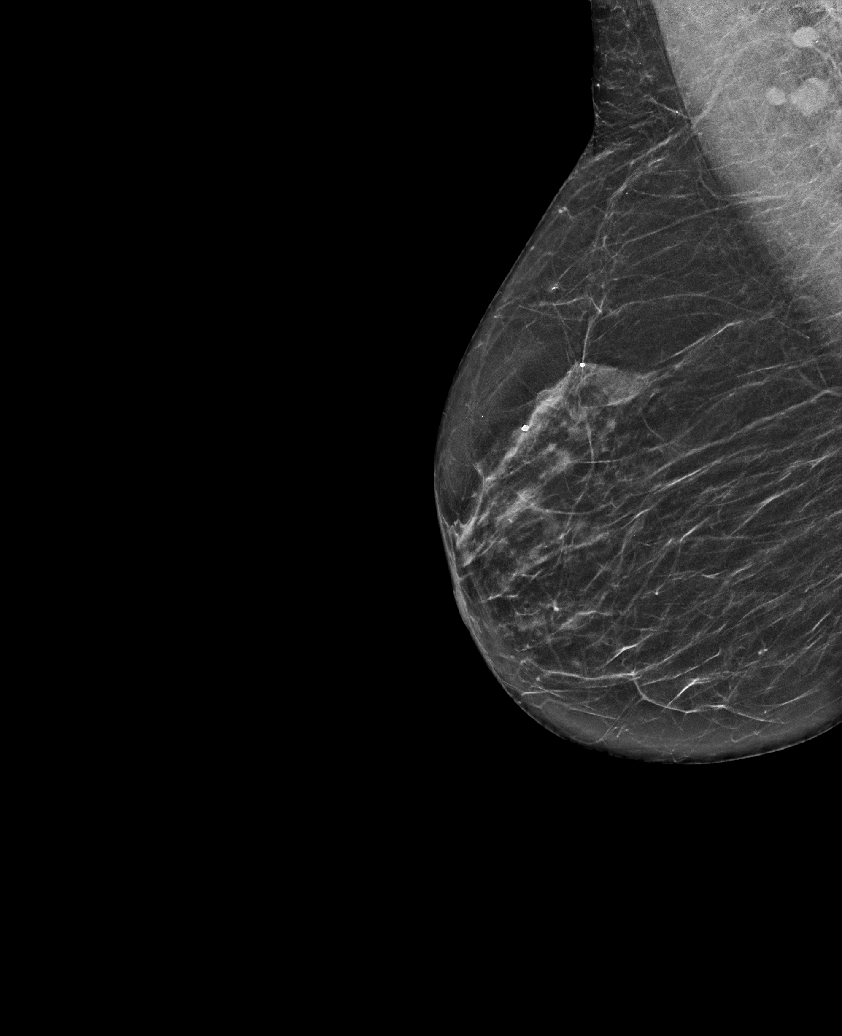

[L CC synth-2D]
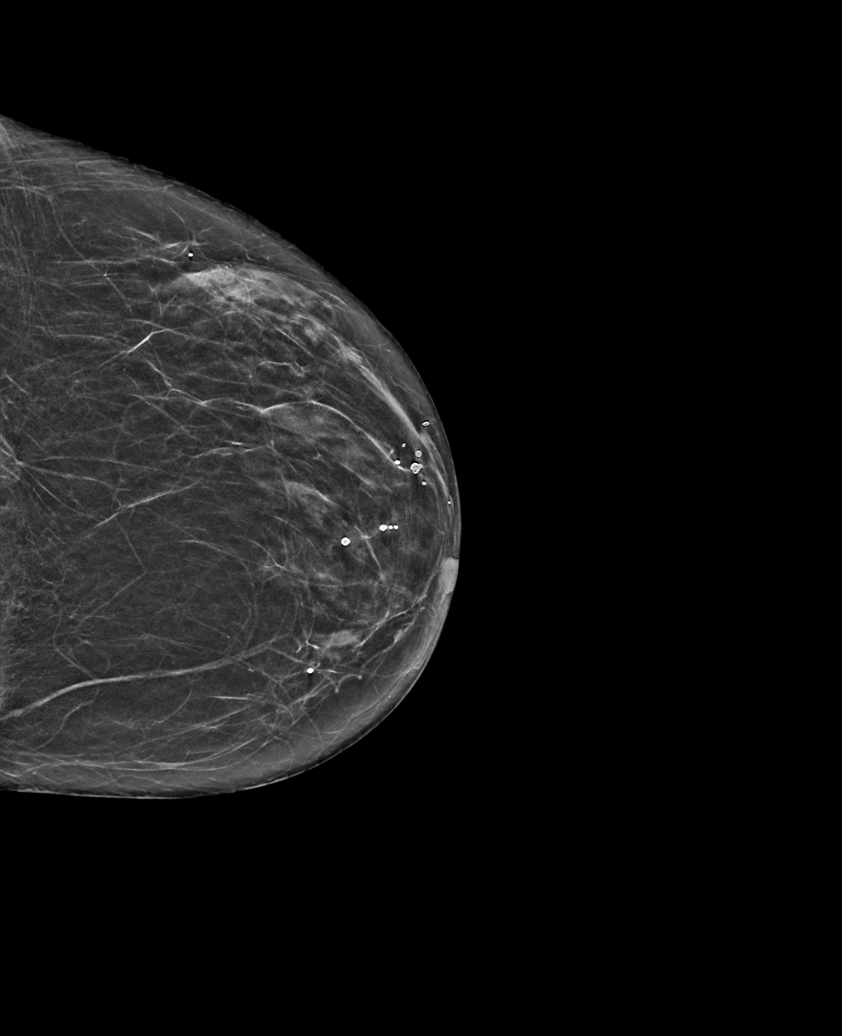

[R MLO tomo · tomo slice 29/58.0]
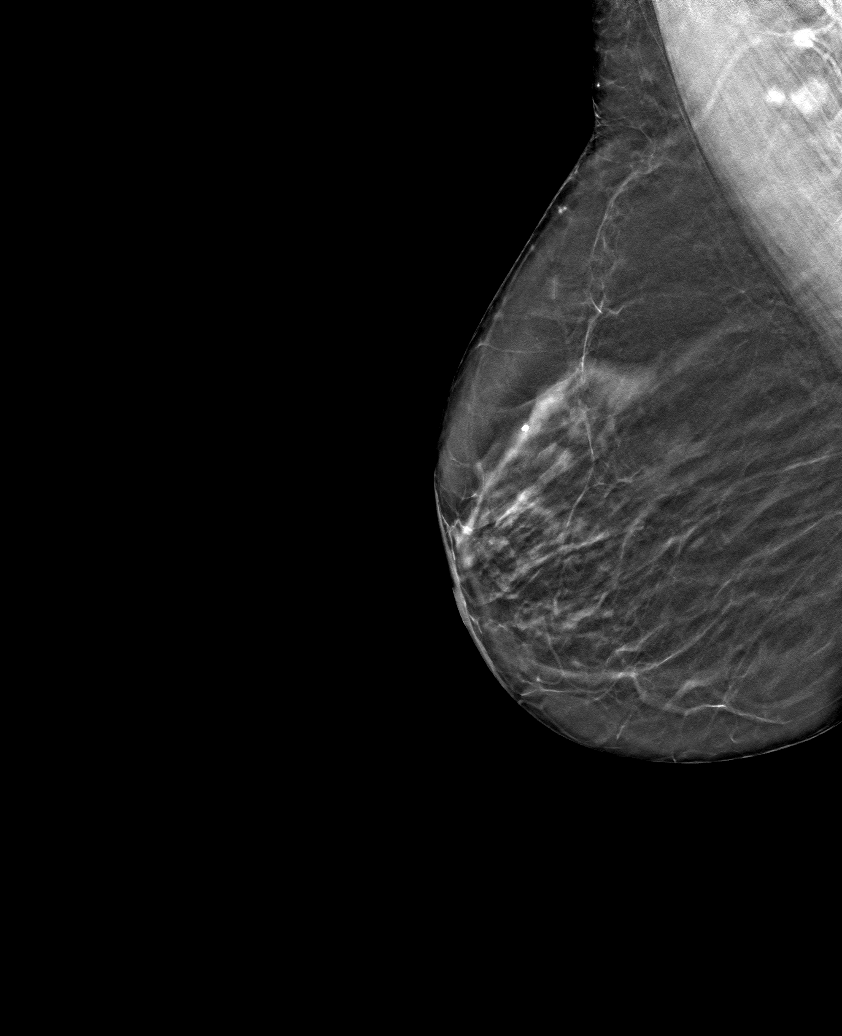

[6 of 30 positions shown; findings below may reference images not displayed]

ACR Breast Density Category b: There are scattered areas of
fibroglandular density.
FINDINGS: There are no findings suspicious for malignancy.
IMPRESSION: No mammographic evidence of malignancy. A result letter of this
screening mammogram will be mailed directly to the patient.

RECOMMENDATION:
Screening mammogram in one year. (Code:51-O-LD2)

BI-RADS CATEGORY  1: Negative.

## 2022-06-19 ENCOUNTER — Ambulatory Visit
Admission: RE | Admit: 2022-06-19 | Discharge: 2022-06-19 | Disposition: A | Discharge status: Home / Self Care | Payer: Medicare Other | Source: Ambulatory Visit | Attending: Internal Medicine | Admitting: Internal Medicine

## 2022-06-19 DIAGNOSIS — Z1231 Encounter for screening mammogram for malignant neoplasm of breast: Secondary | ICD-10-CM

## 2022-07-04 ENCOUNTER — Ambulatory Visit
Admission: EM | Admit: 2022-07-04 | Discharge: 2022-07-04 | Disposition: A | Discharge status: Home / Self Care | Payer: Medicare Other

## 2022-07-04 DIAGNOSIS — H6123 Impacted cerumen, bilateral: Secondary | ICD-10-CM | POA: Diagnosis not present

## 2022-07-04 DIAGNOSIS — I10 Essential (primary) hypertension: Secondary | ICD-10-CM | POA: Diagnosis not present

## 2022-07-04 NOTE — Discharge Instructions (Signed)
We were able to get the wax out.  Use Debrox twice a week as needed.  Please return if you have any recurrent or changing symptoms.  Monitor your blood pressure at home.  If this remains elevated please see your primary care.  Continue your medication as prescribed.  Avoid NSAIDs (aspirin, ibuprofen/Advil, naproxen/Aleve), decongestants, caffeine, sodium.  If you develop any chest pain, shortness of breath, headache, vision change in the setting of high blood pressure you should be seen immediately.

## 2022-07-04 NOTE — ED Triage Notes (Signed)
Patient to Urgent Care with complaints of bilateral ear fullness, reports mostly her right side. Reports concerns about wax build up. States her hearing has been muffled for several months.

## 2022-07-04 NOTE — ED Provider Notes (Signed)
[facility]    CSN: [id number] Arrival date & time: 07/04/22  1001      History   Chief Complaint Chief Complaint  Patient presents with   Ear Fullness    HPI Holly Perez is a 82 y.o. female.   Patient presents today with a several month history of worsening difficulty hearing and a sensation of ear fullness.  Reports that this is worse on the right but involves both the ears.  She does have a history of recurrent cerumen impaction.  She has not tried any over-the-counter medication.  Denies any recent illness or additional symptoms including cough or congestion.  Denies any associated pain.  She is requesting ear irrigation in clinic today.  Blood pressure is elevated.  She reports a history of whitecoat syndrome and also states that she is under a lot of stress as her brother recently died in a fire and was in [place].  She denies any chest pain, shortness of breath, headache, vision change, dizziness.  She has been compliant with her blood pressure medication.  She follows up closely with her primary care.    Past Medical History:  Diagnosis Date   Asthma    Colon polyp 03/07/2010   2 mm tubular adenoma the ascending colon. Dr. Dionne Milo.    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   Chronic hyponatremia 09/04/2019   Microalbuminuria 05/16/2017   Back pain of thoracolumbar region 07/03/2014   White coat syndrome with hypertension 06/30/2014   Diverticulosis of colon without hemorrhage 06/08/2013   History of colonic polyps 06/08/2013   S/P total hysterectomy 06/08/2013   Generalized anxiety disorder 11/27/2012   Breast cancer screening by mammogram 05/31/2012   Acquired hypothyroidism 10/30/2011   Asthma 10/30/2011   DJD (degenerative joint disease) of knee 10/30/2011   Osteoporosis, 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 Ernst Breach: 64mm tubular adenoma of the ascending colon without dysplasia or malignancy.   COLONOSCOPY WITH PROPOFOL N/A 01/14/2018   Procedure: COLONOSCOPY WITH PROPOFOL;  Surgeon: Robert Bellow, MD;  Location: [facility] ENDOSCOPY;  Service: Endoscopy;  Laterality: N/A;   ECTOPIC PREGNANCY SURGERY     at age 50   [place]   tumor    OB History     Gravida  1   Para  0   Term      Preterm      AB      Living         SAB      IAB      Ectopic      Multiple      Live Births           Obstetric Comments  1st Menstrual Cycle:  13 1st Pregnancy:  61 Menopause age 36          Home Medications    Prior to Admission medications   Medication Sig Start Date End Date Taking? Authorizing Provider  albuterol (PROVENTIL HFA;VENTOLIN HFA) 108 (90 BASE) MCG/ACT inhaler Inhale into the lungs every 6 (six) hours as needed for wheezing or shortness of breath.    [provider]  ALPRAZolam (XANAX) 0.25 MG tablet Take 1 tablet (0.25 mg total) by mouth at bedtime as needed. 03/16/22   Crecencio Mc, MD  amLODipine (NORVASC) 10 MG tablet TAKE 1 TABLET(10 MG) BY MOUTH DAILY 02/28/22   Crecencio Mc, MD  aspirin 81 MG tablet Take 81 mg by mouth daily.    [provider]  Calcium Carbonate-Vitamin D (CALTRATE 600+D PO) Take 1,200 mg by mouth daily.     [provider]  cholecalciferol (VITAMIN D) 1000 UNITS tablet Take 2,000 Units by mouth daily.    [provider]  fish oil-omega-3 fatty acids 1000 MG capsule Take 1 g by mouth daily.     [provider]  Fluticasone-Salmeterol (ADVAIR) 250-50 MCG/DOSE AEPB Inhale 1 puff into the lungs every 12 (twelve) hours.    [provider]  levothyroxine (SYNTHROID) 50 MCG tablet Take 50 mcg by  mouth daily.    [provider]  losartan (COZAAR) 50 MG tablet TAKE 1 TABLET(50 MG) BY MOUTH DAILY 02/04/22   Crecencio Mc, MD  Multiple Vitamin (MULTIVITAMIN) tablet Take 1 tablet by mouth daily.    [provider]  nystatin cream (MYCOSTATIN) Apply 1 Application topically 2 (two) times daily. To rash until resolved 03/21/22   Crecencio Mc, MD  Probiotic Product (PROBIOTIC ADVANCED PO) Take 1 capsule by mouth daily at 2 am.    [provider]  traMADol (ULTRAM) 50 MG tablet TAKE 1 TABLET(50 MG) BY MOUTH TWICE DAILY FOR MODERATE PAINTAKE 1 TABLET(50 MG) BY MOUTH TWICE DAILY FOR MODERATE PAIN 04/22/22   Crecencio Mc, MD  vitamin E 100 UNIT capsule Take 100 Units by mouth daily.    [provider]    Family History Family History  Problem Relation Age of Onset   Early death Mother    COPD Father    Early death Brother    Breast cancer Neg Hx     Social History Social History   Tobacco Use   Smoking status: Never   Smokeless tobacco: Never  Substance Use Topics   Alcohol use: No   Drug use: No     Allergies   Augmentin [amoxicillin-pot clavulanate] and Latex   Review of Systems Review of Systems  Constitutional:  Negative for activity change, appetite change, fatigue and fever.  HENT:  Positive for hearing loss. Negative for congestion and ear pain.   Eyes:  Negative for visual disturbance.  Respiratory:  Negative for cough and shortness of breath.   Cardiovascular:  Negative for chest pain.  Neurological:  Negative for dizziness, light-headedness and headaches.     Physical Exam Triage Vital Signs ED Triage Vitals  Enc Vitals Group     BP 07/04/22 1017 (!) 165/84     Pulse Rate 07/04/22 1017 84     Resp 07/04/22 1017 16     Temp 07/04/22 1017 98.3 F (36.8 C)     Temp Source 07/04/22 1017 Oral     SpO2 07/04/22 1017 99 %     Weight --      Height --      Head Circumference --      Peak Flow --      Pain Score  07/04/22 1018 0     Pain Loc --      Pain Edu? --      Excl. in [place]? --    No data found.  Updated Vital Signs BP (!) 165/84 (BP Location: Left Arm) Comment:  Pt states she has white coat syndrome.  Pulse 84   Temp 98.3 F (36.8 C) (Oral)   Resp 16   SpO2 99%   Visual Acuity Right Eye Distance:   Left Eye Distance:   Bilateral Distance:    Right Eye Near:   Left Eye Near:    Bilateral Near:     Physical Exam Vitals reviewed.  Constitutional:      General: She is awake. She is not in acute distress.    Appearance: Normal appearance. She is well-developed. She is not ill-appearing.     Comments: Very pleasant female appears stated age in no acute distress sitting comfortably in exam room  HENT:     Head: Normocephalic and atraumatic.     Right Ear: There is impacted cerumen. Tympanic membrane is not erythematous or bulging.     Left Ear: There is impacted cerumen. Tympanic membrane is not erythematous or bulging.     Ears:     Comments: Cerumen impaction noted bilaterally.  This resolved with in office irrigation revealing normal TM.    Nose: Nose normal.     Mouth/Throat:     Pharynx: Uvula midline. No oropharyngeal exudate or posterior oropharyngeal erythema.  Cardiovascular:     Rate and Rhythm: Normal rate and regular rhythm.     Heart sounds: Normal heart sounds, S1 normal and S2 normal. No murmur heard. Pulmonary:     Effort: Pulmonary effort is normal.     Breath sounds: Normal breath sounds. No wheezing, rhonchi or rales.     Comments: Clear to auscultation bilaterally Abdominal:     General: Bowel sounds are normal.     Palpations: Abdomen is soft.     Tenderness: There is no abdominal tenderness. There is no right CVA tenderness, left CVA tenderness, guarding or rebound.  Psychiatric:        Behavior: Behavior is cooperative.      UC Treatments / Results  Labs (all labs ordered are listed, but only abnormal results are displayed) Labs Reviewed - No  data to display  EKG   Radiology No results found.  Procedures Procedures (including critical care time)  Medications Ordered in UC Medications - No data to display  Initial Impression / Assessment and Plan / UC Course  I have reviewed the triage vital signs and the nursing notes.  Pertinent labs & imaging results that were available during my care of the patient were reviewed by me and considered in my medical decision making (see chart for details).     Cerumen impaction was resolved with in office irrigation.  Recommend that she use over-the-counter ceruminolytic medications such as Debrox as needed to prevent recurrence.  She can return if she has any worsening or changing symptoms.  Blood pressure was elevated today but patient denies any signs/symptoms of endorgan damage.  She reports a history of whitecoat syndrome.  Recommend she monitor her blood pressure at home and follow-up with her primary care if this is persistently above 140/90.  If she develops any chest pain, shortness of breath, headache, vision change, dizziness in the setting of high blood pressure she is to be seen immediately.  Strict return precautions given.  Recommend close follow-up with her primary care.  Final Clinical Impressions(s) / UC Diagnoses   Final diagnoses:  Hearing loss of both ears due to cerumen impaction  Bilateral impacted cerumen  Elevated blood pressure reading with diagnosis of hypertension     Discharge Instructions  We were able to get the wax out.  Use Debrox twice a week as needed.  Please return if you have any recurrent or changing symptoms.  Monitor your blood pressure at home.  If this remains elevated please see your primary care.  Continue your medication as prescribed.  Avoid NSAIDs (aspirin, ibuprofen/Advil, naproxen/Aleve), decongestants, caffeine, sodium.  If you develop any chest pain, shortness of breath, headache, vision change in the setting of high blood  pressure you should be seen immediately.     ED Prescriptions   None    PDMP not reviewed this encounter.   Terrilee Croak, PA-C 07/04/22 1044

## 2022-07-23 ENCOUNTER — Other Ambulatory Visit: Payer: Self-pay

## 2022-07-23 MED ORDER — LEVOTHYROXINE SODIUM 50 MCG PO TABS: 50.0000 ug | ORAL_TABLET | Freq: Every day | ORAL | 0 refills | Status: DC

## 2022-07-24 ENCOUNTER — Other Ambulatory Visit: Payer: Self-pay | Admitting: Internal Medicine

## 2022-08-01 ENCOUNTER — Other Ambulatory Visit: Payer: Self-pay | Admitting: Internal Medicine

## 2022-09-09 ENCOUNTER — Other Ambulatory Visit: Payer: Self-pay | Admitting: Internal Medicine

## 2022-09-26 ENCOUNTER — Encounter: Payer: Self-pay | Admitting: Internal Medicine

## 2022-09-26 ENCOUNTER — Ambulatory Visit (INDEPENDENT_AMBULATORY_CARE_PROVIDER_SITE_OTHER): Payer: Medicare Other | Admitting: Internal Medicine

## 2022-09-26 VITALS — BP 132/72 | HR 80 | Temp 98.1°F | Ht 60.0 in | Wt 126.4 lb

## 2022-09-26 DIAGNOSIS — M546 Pain in thoracic spine: Secondary | ICD-10-CM | POA: Diagnosis not present

## 2022-09-26 DIAGNOSIS — F411 Generalized anxiety disorder: Secondary | ICD-10-CM | POA: Diagnosis not present

## 2022-09-26 DIAGNOSIS — M4003 Postural kyphosis, cervicothoracic region: Secondary | ICD-10-CM | POA: Diagnosis not present

## 2022-09-26 DIAGNOSIS — R7301 Impaired fasting glucose: Secondary | ICD-10-CM | POA: Diagnosis not present

## 2022-09-26 DIAGNOSIS — E039 Hypothyroidism, unspecified: Secondary | ICD-10-CM | POA: Diagnosis not present

## 2022-09-26 DIAGNOSIS — I10 Essential (primary) hypertension: Secondary | ICD-10-CM | POA: Diagnosis not present

## 2022-09-26 DIAGNOSIS — M810 Age-related osteoporosis without current pathological fracture: Secondary | ICD-10-CM | POA: Diagnosis not present

## 2022-09-26 DIAGNOSIS — E785 Hyperlipidemia, unspecified: Secondary | ICD-10-CM

## 2022-09-26 DIAGNOSIS — E871 Hypo-osmolality and hyponatremia: Secondary | ICD-10-CM

## 2022-09-26 DIAGNOSIS — F418 Other specified anxiety disorders: Secondary | ICD-10-CM

## 2022-09-26 DIAGNOSIS — M545 Low back pain, unspecified: Secondary | ICD-10-CM

## 2022-09-26 DIAGNOSIS — F4321 Adjustment disorder with depressed mood: Secondary | ICD-10-CM

## 2022-09-26 DIAGNOSIS — Z1231 Encounter for screening mammogram for malignant neoplasm of breast: Secondary | ICD-10-CM

## 2022-09-26 LAB — LIPID PANEL
Cholesterol: 145 mg/dL (ref 0–200)
HDL: 54.4 mg/dL (ref >39.00)
LDL Cholesterol: 79 mg/dL (ref 0–99)
NonHDL: 90.35
Total CHOL/HDL Ratio: 3
Triglycerides: 59 mg/dL (ref 0.0–149.0)
VLDL: 11.8 mg/dL (ref 0.0–40.0)

## 2022-09-26 LAB — MICROALBUMIN / CREATININE URINE RATIO
Creatinine,U: 150.1 mg/dL
Microalb Creat Ratio: 12.8 mg/g (ref 0.0–30.0)
Microalb, Ur: 19.2 mg/dL — ABNORMAL HIGH (ref 0.0–1.9)

## 2022-09-26 LAB — COMPREHENSIVE METABOLIC PANEL
ALT: 16 U/L (ref 0–35)
AST: 26 U/L (ref 0–37)
Albumin: 4.1 g/dL (ref 3.5–5.2)
Alkaline Phosphatase: 52 U/L (ref 39–117)
BUN: 10 mg/dL (ref 6–23)
CO2: 30 mEq/L (ref 19–32)
Calcium: 9.9 mg/dL (ref 8.4–10.5)
Chloride: 96 mEq/L (ref 96–112)
Creatinine, Ser: 0.78 mg/dL (ref 0.40–1.20)
GFR: 70.34 mL/min (ref >60.00)
Glucose, Bld: 91 mg/dL (ref 70–99)
Potassium: 4.7 mEq/L (ref 3.5–5.1)
Sodium: 131 mEq/L — ABNORMAL LOW (ref 135–145)
Total Bilirubin: 0.3 mg/dL (ref 0.2–1.2)
Total Protein: 8 g/dL (ref 6.0–8.3)

## 2022-09-26 LAB — CBC WITH DIFFERENTIAL/PLATELET
Basophils Absolute: 0 10*3/uL (ref 0.0–0.1)
Basophils Relative: 0.9 % (ref 0.0–3.0)
Eosinophils Absolute: 0.1 10*3/uL (ref 0.0–0.7)
Eosinophils Relative: 3.6 % (ref 0.0–5.0)
HCT: 38.1 % (ref 36.0–46.0)
Hemoglobin: 12.3 g/dL (ref 12.0–15.0)
Lymphocytes Relative: 34.3 % (ref 12.0–46.0)
Lymphs Abs: 1.4 10*3/uL (ref 0.7–4.0)
MCHC: 32.3 g/dL (ref 30.0–36.0)
MCV: 88.6 fl (ref 78.0–100.0)
Monocytes Absolute: 0.3 10*3/uL (ref 0.1–1.0)
Monocytes Relative: 7.3 % (ref 3.0–12.0)
Neutro Abs: 2.2 10*3/uL (ref 1.4–7.7)
Neutrophils Relative %: 53.9 % (ref 43.0–77.0)
Platelets: 395 10*3/uL (ref 150.0–400.0)
RBC: 4.3 Mil/uL (ref 3.87–5.11)
RDW: 13 % (ref 11.5–15.5)
WBC: 4.1 10*3/uL (ref 4.0–10.5)

## 2022-09-26 LAB — LDL CHOLESTEROL, DIRECT: Direct LDL: 79 mg/dL

## 2022-09-26 LAB — TSH: TSH: 3.3 u[IU]/mL (ref 0.35–5.50)

## 2022-09-26 LAB — HEMOGLOBIN A1C: Hgb A1c MFr Bld: 5.7 % (ref 4.6–6.5)

## 2022-09-26 MED ORDER — TRAMADOL HCL 50 MG PO TABS: ORAL_TABLET | ORAL | 5 refills | Status: DC

## 2022-09-26 MED ORDER — ALPRAZOLAM 0.25 MG PO TABS: 0.2500 mg | ORAL_TABLET | Freq: Every evening | ORAL | 5 refills | Status: DC | PRN

## 2022-09-26 MED ORDER — LEVOTHYROXINE SODIUM 50 MCG PO TABS: ORAL_TABLET | ORAL | 0 refills | Status: DC

## 2022-09-26 NOTE — Assessment & Plan Note (Addendum)
Stable , mild  present since 2011.  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  Managed with liberalization of  salt in diet and maintainenance  good hydration with 60 ounces of water daily   Lab Results  Component Value Date   NA 131 (L) 09/26/2022   K 4.7 09/26/2022   CL 96 09/26/2022   CO2 30 09/26/2022

## 2022-09-26 NOTE — Patient Instructions (Addendum)
You are doing amazingly well in spite of your grief  You can use aspercreme  or aleve cream on your veins when they feel irritated   Your bone density test  been ordered.  Please call to make your appointment at [place]  [phone]     You can cancel your appointment with Dr Gershon Crane.  I will manage your thyroid and sodium levels

## 2022-09-26 NOTE — Progress Notes (Signed)
Subjective:  Patient ID: Holly Perez, female    DOB: 30-Jan-1941  Age: 82 y.o. MRN: [id number]  CC: The primary encounter diagnosis was Acquired hypothyroidism. Diagnoses of Essential hypertension, Hyperlipidemia, unspecified hyperlipidemia type, Impaired fasting glucose, Osteoporosis, postmenopausal, Grief, Chronic hyponatremia, Breast cancer screening by mammogram, Mixed anxiety and depressive disorder, Generalized anxiety disorder, White coat syndrome with hypertension, Postural kyphosis of cervicothoracic region, and Back pain of thoracolumbar region were also pertinent to this visit.   HPI MALAREE BUZBY presents for  Chief Complaint  Patient presents with   Medical Management of Chronic Issues    6 month follow up    1) HTN:  with  WHITE COAT SYNDROME .  120 to 132/72.  Brother died tragically  in a fire in 05-25-22.  Still grieving and plagued.  Lost her memory for a while,  and her  Sister died of sepsis secondary to 3rd degree bursn from a fire 2 yrs ago .  She is the only remaining of 6.   2) chronic pain managed with tramadol once or twice daily.  She remains active and walks daily for exercise  3) GAD managed with low dose alprazolam  at night only   . No nightmares.   4) HAD MILD COVID INFECTION in 2022/05/25 .  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     Outpatient Medications Prior to Visit  Medication Sig Dispense Refill   albuterol (PROVENTIL HFA;VENTOLIN HFA) 108 (90 BASE) MCG/ACT inhaler Inhale into the lungs every 6 (six) hours as needed for wheezing or shortness of breath.     amLODipine (NORVASC) 10 MG tablet TAKE 1 TABLET(10 MG) BY MOUTH DAILY 90 tablet 1   aspirin 81 MG tablet Take 81 mg by mouth daily.     Calcium Carbonate-Vitamin D (CALTRATE 600+D PO) Take 1,200 mg by mouth daily.      cholecalciferol (VITAMIN D)  1000 UNITS tablet Take 2,000 Units by mouth daily.     fish oil-omega-3 fatty acids 1000 MG capsule Take 1 g by mouth daily.      Fluticasone-Salmeterol (ADVAIR) 250-50 MCG/DOSE AEPB Inhale 1 puff into the lungs every 12 (twelve) hours.     losartan (COZAAR) 50 MG tablet TAKE 1 TABLET(50 MG) BY MOUTH DAILY 90 tablet 1   Multiple Vitamin (MULTIVITAMIN) tablet Take 1 tablet by mouth daily.     nystatin cream (MYCOSTATIN) Apply 1 Application topically 2 (two) times daily. To rash until resolved 30 g 0   Probiotic Product (PROBIOTIC ADVANCED PO) Take 1 capsule by mouth daily at 2 am.     vitamin E 100 UNIT capsule Take 100 Units by mouth daily.     ALPRAZolam (XANAX) 0.25 MG tablet Take 1 tablet (0.25 mg total) by mouth at bedtime as needed. 30 tablet 5   levothyroxine (SYNTHROID) 50 MCG tablet TAKE 1 TABLET(50 MCG) BY MOUTH DAILY 90 tablet 0   traMADol (ULTRAM) 50 MG tablet TAKE 1 TABLET(50 MG) BY MOUTH TWICE DAILY FOR MODERATE PAINTAKE 1 TABLET(50 MG) BY MOUTH TWICE DAILY FOR MODERATE PAIN 60 tablet 5   No facility-administered medications prior to visit.    Review of Systems;  Patient denies headache, fevers, malaise, unintentional weight loss, skin rash, eye pain, sinus congestion and sinus pain, sore throat, dysphagia,  hemoptysis , cough, dyspnea, wheezing, chest  pain, palpitations, orthopnea, edema, abdominal pain, nausea, melena, diarrhea, constipation, flank pain, dysuria, hematuria, urinary  Frequency, nocturia, numbness, tingling, seizures,  Focal weakness, Loss of consciousness,  Tremor, insomnia, depression, anxiety, and suicidal ideation.      Objective:  BP 132/72   Pulse 80   Temp 98.1 F (36.7 C) (Oral)   Ht 5' (1.524 m)   Wt 126 lb 6.4 oz (57.3 kg)   SpO2 98%   BMI 24.69 kg/m   BP Readings from Last 3 Encounters:  09/26/22 132/72  07/04/22 (!) 165/84  05/18/22 (!) 184/77    Wt Readings from Last 3 Encounters:  09/26/22 126 lb 6.4 oz (57.3 kg)  03/21/22 129 lb  (58.5 kg)  03/21/22 129 lb 9.6 oz (58.8 kg)    Physical Exam Vitals reviewed.  Constitutional:      General: She is not in acute distress.    Appearance: Normal appearance. She is normal weight. She is not ill-appearing, toxic-appearing or diaphoretic.  HENT:     Head: Normocephalic.  Eyes:     General: No scleral icterus.       Right eye: No discharge.        Left eye: No discharge.     Conjunctiva/sclera: Conjunctivae normal.  Cardiovascular:     Rate and Rhythm: Normal rate and regular rhythm.     Heart sounds: Normal heart sounds.  Pulmonary:     Effort: Pulmonary effort is normal. No respiratory distress.     Breath sounds: Normal breath sounds.  Musculoskeletal:        General: Normal range of motion.  Skin:    General: Skin is warm and dry.  Neurological:     General: No focal deficit present.     Mental Status: She is alert and oriented to person, place, and time. Mental status is at baseline.  Psychiatric:        Mood and Affect: Mood normal.        Behavior: Behavior normal.        Thought Content: Thought content normal.        Judgment: Judgment normal.     Lab Results  Component Value Date   HGBA1C 5.7 09/26/2022   HGBA1C 5.9 09/13/2021    Lab Results  Component Value Date   CREATININE 0.78 09/26/2022   CREATININE 0.74 03/21/2022   CREATININE 0.88 09/13/2021    Lab Results  Component Value Date   WBC 4.1 09/26/2022   HGB 12.3 09/26/2022   HCT 38.1 09/26/2022   PLT 395.0 09/26/2022   GLUCOSE 91 09/26/2022   CHOL 145 09/26/2022   TRIG 59.0 09/26/2022   HDL 54.40 09/26/2022   LDLDIRECT 79.0 09/26/2022   LDLCALC 79 09/26/2022   ALT 16 09/26/2022   AST 26 09/26/2022   NA 131 (L) 09/26/2022   K 4.7 09/26/2022   CL 96 09/26/2022   CREATININE 0.78 09/26/2022   BUN 10 09/26/2022   CO2 30 09/26/2022   TSH 3.30 09/26/2022   HGBA1C 5.7 09/26/2022   MICROALBUR 19.2 (H) 09/26/2022    No results found.  Assessment & Plan:  .Acquired  hypothyroidism -     TSH  Essential hypertension -     Comprehensive metabolic panel -     Microalbumin / creatinine urine ratio  Hyperlipidemia, unspecified hyperlipidemia type -     Lipid panel -     LDL cholesterol, direct -     CBC with Differential/Platelet -     Hemoglobin A1c  Impaired fasting glucose -     Comprehensive metabolic panel -     Hemoglobin A1c  Osteoporosis, postmenopausal -     DG Bone Density; Future  Grief  Chronic hyponatremia Assessment & Plan: Stable , mild  present since 10-08-2009.  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  Managed with liberalization of  salt in diet and maintainenance  good hydration with 60 ounces of water daily   Lab Results  Component Value Date   NA 131 (L) 09/26/2022   K 4.7 09/26/2022   CL 96 09/26/2022   CO2 30 09/26/2022      Breast cancer screening by mammogram Assessment & Plan: She prefers to continue annual screening which has been normal n is up to date    Mixed anxiety and depressive disorder Assessment & Plan: Complicated by grief following the tragic death of her brother,  her last remaining sibling,  [place] (he died in a fire that was suspected to be arson ) . She has not tolerated trials of SSRIs .  She is using alprazolam responsibly and has not escalated use.  A faith based counselling approach was used to allow patient to explore and manage her feelings and reactions.  The risks and benefits of benzodiazepine use were reviewed  with patient today including excessive sedation leading to respiratory depression,  impaired thinking/driving, and addiction.  Patient has had no withdrawal symptoms during periods of unintentional suspension. She was reminded to avoid concurrent use with alcohol, to use medication only as needed and not to share with others  .    Generalized anxiety disorder Assessment & Plan: She has not tolerated trials of SSRIs .  She is using  alprazolam responsibly and has not escalated use. The risks and benefits of benzodiazepine use were reviewed  with patient today including excessive sedation leading to respiratory depression,  impaired thinking/driving, and addiction.  Patient was advised to avoid concurrent use with alcohol, to use medication only as needed and not to share with others  .    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  She is working on her posture/   Back pain of thoracolumbar region Assessment & Plan: Managed with exercise,  Weight training,  Tylenol and prn Tramadol .  She has not had any ER visits  And has not requested any early refills.  Her Refill history was confirmed via [place] Controlled Substance database by me today during her visit and there have been no prescriptions of controlled substances filled from any providers other than me. .    Other orders -     Levothyroxine Sodium; TAKE 1 TABLET(50 MCG) BY MOUTH DAILY  Dispense: 90 tablet; Refill: 0 -     traMADol HCl; TAKE 1 TABLET(50 MG) BY MOUTH TWICE DAILY FOR MODERATE PAINTAKE 1 TABLET(50 MG) BY MOUTH TWICE DAILY FOR MODERATE PAIN  Dispense: 60 tablet; Refill: 5 -     ALPRAZolam; Take 1 tablet (0.25 mg total) by mouth at bedtime  as needed.  Dispense: 30 tablet; Refill: 5    Follow-up: Return in about 6 months (around 03/28/2023).   Sherlene Shams, MD

## 2022-09-26 NOTE — Assessment & Plan Note (Signed)
Complicated by grief following the tragic death of her brother,  her last remaining sibling,  in [place] (he died in a fire that was suspected to be arson ) . She has not tolerated trials of SSRIs .  She is using alprazolam responsibly and has not escalated use.  A faith based counselling approach was used to allow patient to explore and manage her feelings and reactions.  The risks and benefits of benzodiazepine use were reviewed  with patient today including excessive sedation leading to respiratory depression,  impaired thinking/driving, and addiction.  Patient has had no withdrawal symptoms during periods of unintentional suspension. She was reminded to avoid concurrent use with alcohol, to use medication only as needed and not to share with others  .

## 2022-09-26 NOTE — Assessment & Plan Note (Signed)
She prefers to continue annual screening which has been normal n is up to date

## 2022-09-28 NOTE — Assessment & Plan Note (Signed)
Managed with exercise,  Weight training,  Tylenol and prn Tramadol .  She has not had any ER visits  And has not requested any early refills.  Her Refill history was confirmed via [place] Controlled Substance database by me today during her visit and there have been no prescriptions of controlled substances filled from any providers other than me. .  

## 2022-09-28 NOTE — Assessment & Plan Note (Signed)
Chronic,  With no painful history to suggest vertebral fracture .  She is working on her [employer], broadcasting/film/video

## 2022-09-28 NOTE — Assessment & Plan Note (Signed)
Home readings are consistently < 130/80 on amlodipine and losartan.Marland Kitchen  No changes today  Lab Results  Component Value Date   CREATININE 0.78 09/26/2022   Lab Results  Component Value Date   NA 131 (L) 09/26/2022   K 4.7 09/26/2022   CL 96 09/26/2022   CO2 30 09/26/2022

## 2022-09-28 NOTE — Assessment & Plan Note (Signed)
She has not tolerated trials of SSRIs .  She is using alprazolam responsibly and has not escalated use. The risks and benefits of benzodiazepine use were reviewed  with patient today including excessive sedation leading to respiratory depression,  impaired thinking/driving, and addiction.  Patient was advised to avoid concurrent use with alcohol, to use medication only as needed and not to share with others  .  

## 2022-09-30 ENCOUNTER — Telehealth: Payer: Self-pay | Admitting: Internal Medicine

## 2022-09-30 NOTE — Telephone Encounter (Signed)
Pt would like to be called concerning her blood work

## 2022-10-01 NOTE — Telephone Encounter (Signed)
Pt is aware of lab results and gave a verbal understanding.  

## 2022-12-04 ENCOUNTER — Ambulatory Visit
Admission: RE | Admit: 2022-12-04 | Discharge: 2022-12-04 | Disposition: A | Discharge status: Home / Self Care | Payer: Medicare Other | Source: Ambulatory Visit | Attending: Internal Medicine | Admitting: Internal Medicine

## 2022-12-04 DIAGNOSIS — M8588 Other specified disorders of bone density and structure, other site: Secondary | ICD-10-CM | POA: Diagnosis not present

## 2022-12-04 DIAGNOSIS — M810 Age-related osteoporosis without current pathological fracture: Secondary | ICD-10-CM | POA: Insufficient documentation

## 2022-12-04 DIAGNOSIS — Z780 Asymptomatic menopausal state: Secondary | ICD-10-CM | POA: Diagnosis not present

## 2022-12-09 ENCOUNTER — Other Ambulatory Visit: Payer: Self-pay

## 2022-12-09 MED ORDER — AMLODIPINE BESYLATE 10 MG PO TABS: ORAL_TABLET | ORAL | 1 refills | Status: DC

## 2023-01-30 ENCOUNTER — Other Ambulatory Visit: Payer: Self-pay | Admitting: Internal Medicine

## 2023-02-05 DIAGNOSIS — H524 Presbyopia: Secondary | ICD-10-CM | POA: Diagnosis not present

## 2023-02-05 DIAGNOSIS — H5203 Hypermetropia, bilateral: Secondary | ICD-10-CM | POA: Diagnosis not present

## 2023-02-05 DIAGNOSIS — H04123 Dry eye syndrome of bilateral lacrimal glands: Secondary | ICD-10-CM | POA: Diagnosis not present

## 2023-02-05 DIAGNOSIS — H52223 Regular astigmatism, bilateral: Secondary | ICD-10-CM | POA: Diagnosis not present

## 2023-02-05 DIAGNOSIS — H2513 Age-related nuclear cataract, bilateral: Secondary | ICD-10-CM | POA: Diagnosis not present

## 2023-02-06 DIAGNOSIS — Z23 Encounter for immunization: Secondary | ICD-10-CM | POA: Diagnosis not present

## 2023-03-21 ENCOUNTER — Ambulatory Visit (INDEPENDENT_AMBULATORY_CARE_PROVIDER_SITE_OTHER): Payer: Medicare Other | Admitting: Internal Medicine

## 2023-03-21 ENCOUNTER — Encounter: Payer: Self-pay | Admitting: Internal Medicine

## 2023-03-21 VITALS — BP 158/82 | HR 84 | Ht 60.0 in | Wt 125.6 lb

## 2023-03-21 DIAGNOSIS — M810 Age-related osteoporosis without current pathological fracture: Secondary | ICD-10-CM | POA: Diagnosis not present

## 2023-03-21 DIAGNOSIS — F4321 Adjustment disorder with depressed mood: Secondary | ICD-10-CM | POA: Diagnosis not present

## 2023-03-21 DIAGNOSIS — Z23 Encounter for immunization: Secondary | ICD-10-CM | POA: Diagnosis not present

## 2023-03-21 DIAGNOSIS — E039 Hypothyroidism, unspecified: Secondary | ICD-10-CM

## 2023-03-21 DIAGNOSIS — R8290 Unspecified abnormal findings in urine: Secondary | ICD-10-CM | POA: Diagnosis not present

## 2023-03-21 DIAGNOSIS — I10 Essential (primary) hypertension: Secondary | ICD-10-CM | POA: Diagnosis not present

## 2023-03-21 DIAGNOSIS — F411 Generalized anxiety disorder: Secondary | ICD-10-CM

## 2023-03-21 DIAGNOSIS — E785 Hyperlipidemia, unspecified: Secondary | ICD-10-CM

## 2023-03-21 LAB — COMPREHENSIVE METABOLIC PANEL
ALT: 15 U/L (ref 0–35)
AST: 25 U/L (ref 0–37)
Albumin: 4 g/dL (ref 3.5–5.2)
Alkaline Phosphatase: 53 U/L (ref 39–117)
BUN: 10 mg/dL (ref 6–23)
CO2: 28 meq/L (ref 19–32)
Calcium: 9.4 mg/dL (ref 8.4–10.5)
Chloride: 94 meq/L — ABNORMAL LOW (ref 96–112)
Creatinine, Ser: 0.74 mg/dL (ref 0.40–1.20)
GFR: 74.67 mL/min (ref >60.00)
Glucose, Bld: 95 mg/dL (ref 70–99)
Potassium: 4.4 meq/L (ref 3.5–5.1)
Sodium: 129 meq/L — ABNORMAL LOW (ref 135–145)
Total Bilirubin: 0.4 mg/dL (ref 0.2–1.2)
Total Protein: 8.1 g/dL (ref 6.0–8.3)

## 2023-03-21 LAB — URINALYSIS, ROUTINE W REFLEX MICROSCOPIC
Bilirubin Urine: NEGATIVE
Ketones, ur: NEGATIVE
Nitrite: NEGATIVE
Specific Gravity, Urine: 1.02 (ref 1.000–1.030)
Total Protein, Urine: 30 — AB
Urine Glucose: NEGATIVE
Urobilinogen, UA: 0.2 (ref 0.0–1.0)
pH: 6.5 (ref 5.0–8.0)

## 2023-03-21 MED ORDER — ALPRAZOLAM 0.25 MG PO TABS: 0.2500 mg | ORAL_TABLET | Freq: Two times a day (BID) | ORAL | 2 refills | Status: DC | PRN

## 2023-03-21 MED ORDER — LEVOTHYROXINE SODIUM 50 MCG PO TABS: ORAL_TABLET | ORAL | 1 refills | Status: DC

## 2023-03-21 NOTE — Progress Notes (Unsigned)
Subjective:  Patient ID: Holly Perez, female    DOB: Aug 04, 1940  Age: 82 y.o. MRN: [id number]  CC: The primary encounter diagnosis was Acquired hypothyroidism. Diagnoses of Essential hypertension, Hyperlipidemia, unspecified hyperlipidemia type, Bad odor of urine, Need for influenza vaccination, Generalized anxiety disorder, Grief, and Osteoporosis, postmenopausal were also pertinent to this visit.   HPI Holly Perez presents for  Chief Complaint  Patient presents with   Medical Management of Chronic Issues    6 month follow up    1) HTN:  bp elevated today attributed to life stressors . Very anxious.  Taking her medications (amlodipin and  losartan) as directed.   2) Grief/anxiety   she has had 2 more  untimely deaths of  2 beloved family members in the last month , including a nephew who died by suicide by gun. He has bee very anxious .  Her sister died in a fire prior to last visit,  and now her brother died in a fire as wellin [place] .  Nephew lived in [place]. And had bipolar disorder  recently divorced from his wife.   Using the alprazolam at night onad 1/2 one during te day.  She is one of 6 brothers and sisters.  All gone    Outpatient Medications Prior to Visit  Medication Sig Dispense Refill   amLODipine (NORVASC) 10 MG tablet TAKE 1 TABLET(10 MG) BY MOUTH DAILY 90 tablet 1   aspirin 81 MG tablet Take 81 mg by mouth daily.     Calcium Carbonate-Vitamin D (CALTRATE 600+D PO) Take 1,200 mg by mouth daily.      cholecalciferol (VITAMIN D) 1000 UNITS tablet Take 2,000 Units by mouth daily.     fish oil-omega-3 fatty acids 1000 MG capsule Take 1 g by mouth daily.      losartan (COZAAR) 50 MG tablet TAKE 1 TABLET(50 MG) BY MOUTH DAILY 90 tablet 1   Multiple Vitamin (MULTIVITAMIN) tablet Take 1 tablet by mouth daily.     traMADol (ULTRAM) 50 MG tablet TAKE 1 TABLET(50 MG) BY MOUTH TWICE DAILY FOR MODERATE PAINTAKE 1 TABLET(50 MG) BY MOUTH TWICE DAILY FOR MODERATE PAIN 60  tablet 5   vitamin E 100 UNIT capsule Take 100 Units by mouth daily.     ALPRAZolam (XANAX) 0.25 MG tablet Take 1 tablet (0.25 mg total) by mouth at bedtime as needed. 30 tablet 5   levothyroxine (SYNTHROID) 50 MCG tablet TAKE 1 TABLET(50 MCG) BY MOUTH DAILY 90 tablet 0   albuterol (PROVENTIL HFA;VENTOLIN HFA) 108 (90 BASE) MCG/ACT inhaler Inhale into the lungs every 6 (six) hours as needed for wheezing or shortness of breath. (Patient not taking: Reported on 03/21/2023)     Fluticasone-Salmeterol (ADVAIR) 250-50 MCG/DOSE AEPB Inhale 1 puff into the lungs every 12 (twelve) hours. (Patient not taking: Reported on 03/21/2023)     nystatin cream (MYCOSTATIN) Apply 1 Application topically 2 (two) times daily. To rash until resolved (Patient not taking: Reported on 03/21/2023) 30 g 0   Probiotic Product (PROBIOTIC ADVANCED PO) Take 1 capsule by mouth daily at 2 am. (Patient not taking: Reported on 03/21/2023)     No facility-administered medications prior to visit.    Review of Systems;  Patient denies headache, fevers, malaise, unintentional weight loss, skin rash, eye pain, sinus congestion and sinus pain, sore throat, dysphagia,  hemoptysis , cough, dyspnea, wheezing, chest pain, palpitations, orthopnea, edema, abdominal pain, nausea, melena, diarrhea, constipation, flank pain, dysuria, hematuria, urinary  Frequency, nocturia,  numbness, tingling, seizures,  Focal weakness, Loss of consciousness,  Tremor, insomnia, depression, anxiety, and suicidal ideation.      Objective:  BP (!) 158/82   Pulse 84   Ht 5' (1.524 m)   Wt 125 lb 9.6 oz (57 kg)   SpO2 99%   BMI 24.53 kg/m   BP Readings from Last 3 Encounters:  03/21/23 (!) 158/82  09/26/22 132/72  07/04/22 (!) 165/84    Wt Readings from Last 3 Encounters:  03/21/23 125 lb 9.6 oz (57 kg)  09/26/22 126 lb 6.4 oz (57.3 kg)  03/21/22 129 lb (58.5 kg)    Physical Exam Vitals reviewed.  Constitutional:      General: She is not in  acute distress.    Appearance: Normal appearance. She is normal weight. She is not ill-appearing, toxic-appearing or diaphoretic.  HENT:     Head: Normocephalic.  Eyes:     General: No scleral icterus.       Right eye: No discharge.        Left eye: No discharge.     Conjunctiva/sclera: Conjunctivae normal.  Cardiovascular:     Rate and Rhythm: Normal rate and regular rhythm.     Heart sounds: Normal heart sounds.  Pulmonary:     Effort: Pulmonary effort is normal. No respiratory distress.     Breath sounds: Normal breath sounds.  Musculoskeletal:        General: Normal range of motion.  Skin:    General: Skin is warm and dry.  Neurological:     General: No focal deficit present.     Mental Status: She is alert and oriented to person, place, and time. Mental status is at baseline.  Psychiatric:        Mood and Affect: Mood normal.        Behavior: Behavior normal.        Thought Content: Thought content normal.        Judgment: Judgment normal.    Lab Results  Component Value Date   HGBA1C 5.7 09/26/2022   HGBA1C 5.9 09/13/2021    Lab Results  Component Value Date   CREATININE 0.74 03/21/2023   CREATININE 0.78 09/26/2022   CREATININE 0.74 03/21/2022    Lab Results  Component Value Date   WBC 4.1 09/26/2022   HGB 12.3 09/26/2022   HCT 38.1 09/26/2022   PLT 395.0 09/26/2022   GLUCOSE 95 03/21/2023   CHOL 145 09/26/2022   TRIG 59.0 09/26/2022   HDL 54.40 09/26/2022   LDLDIRECT 79.0 09/26/2022   LDLCALC 79 09/26/2022   ALT 15 03/21/2023   AST 25 03/21/2023   NA 129 (L) 03/21/2023   K 4.4 03/21/2023   CL 94 (L) 03/21/2023   CREATININE 0.74 03/21/2023   BUN 10 03/21/2023   CO2 28 03/21/2023   TSH 3.30 09/26/2022   HGBA1C 5.7 09/26/2022   MICROALBUR 19.2 (H) 09/26/2022    DG Bone Density Result Date: 12/04/2022 EXAM: DUAL X-RAY ABSORPTIOMETRY (DXA) FOR BONE MINERAL DENSITY IMPRESSION: Your patient Holly Perez completed a BMD test on 12/04/2022 using the  [employer] DXA System (software version: 14.10) manufactured by [employer]. The following summarizes the results of our evaluation. Technologist: SCE PATIENT BIOGRAPHICAL: Name: Sidni, Dreyfuss Patient ID:  [id number] Birth Date: 09-Aug-1940 Height:     61.5 in. Gender:      Female Exam Date:  12/04/2022 Weight:     125.6 lbs. Indications: Advanced Age, Asthma, Height Loss, Hypothyroid, Hysterectomy,  Oophorectomy Bilateral, Osteoporotic, Postmenopausal Fractures: Treatments: caltrate, Flonase, Levothyroxine, Multi-Vitamin, Vitamin D DENSITOMETRY RESULTS: Site      Region     Measured Date Measured Age WHO Classification Young Adult T-score BMD         %Change vs. Previous Significant Change (*) AP Spine L1-L4 11/04/[age] 83.4 Osteopenia -2.0 0.952 g/cm2 -2.4% - AP Spine L1-L4 03/05/2018 78.6 Osteopenia -1.8 0.975 g/cm2 - - DualFemur Neck Right 12/04/2022 83.4 Osteopenia -1.5 0.831 g/cm2 -5.7% - DualFemur Neck Right 03/05/2018 78.6 Osteopenia -1.1 0.881 g/cm2 - - DualFemur Total Mean 12/04/2022 83.4 Normal -0.9 0.891 g/cm2 -0.9% - DualFemur Total Mean 03/05/2018 78.6 Normal -0.9 0.899 g/cm2 - - ASSESSMENT: The BMD measured at AP Spine L1-L4 is 0.952 g/cm2 with a T-score of -2.0. This patient is considered osteopenic according to World Health Organization [facility]) criteria. The scan quality is good. Compared with prior study, there has been no significant change in the spine. Compared with prior study, there has been no significant change in the total hip. World Science writer [facility]) criteria for post-menopausal, Caucasian Women: Normal:                   T-score at or above -1 SD Osteopenia/low bone mass: T-score between -1 and -2.5 SD Osteoporosis:             T-score at or below -2.5 SD RECOMMENDATIONS: 1. All patients should optimize calcium and vitamin D intake. 2. Consider FDA-approved medical therapies in postmenopausal women and men aged 41 years and older, based on the following: a. A hip or  vertebral(clinical or morphometric) fracture b. T-score < -2.5 at the femoral neck or spine after appropriate evaluation to exclude secondary causes c. Low bone mass (T-score between -1.0 and -2.5 at the femoral neck or spine) and a 10-year probability of a hip fracture > 3% or a 10-year probability of a major osteoporosis-related fracture > 20% based on the [place]-adapted WHO algorithm 3. Clinician judgment and/or patient preferences may indicate treatment for people with 10-year fracture probabilities above or below these levels FOLLOW-UP: People with diagnosed cases of osteoporosis or at high risk for fracture should have regular bone mineral density tests. For patients eligible for Medicare, routine testing is allowed once every 2 years. The testing frequency can be increased to one year for patients who have rapidly progressing disease, those who are receiving or discontinuing medical therapy to restore bone mass, or have additional risk factors. I have reviewed this report, and agree with the above findings. [facility] Dear Sherlene Shams, Your patient NABELLA BRACEY completed a FRAX assessment on 12/04/2022 using the [facility] iDXA DXA System (analysis version: 14.10) manufactured by [employer]. The following summarizes the results of our evaluation. PATIENT BIOGRAPHICAL: Name: Kassidie, Shelstad Patient ID: [id number] Birth Date: Aug 08, 1940 Height:    61.5 in. Gender:     [age]    [age]       Weight:    125.6 lbs. Ethnicity:  Black                            Exam Date: 12/04/2022 FRAX* RESULTS:  (version: 3.5) 10-year Probability of Fracture1 Major Osteoporotic Fracture2 Hip Fracture 6.0% 1.6% Population: [place] (Black) Risk Factors: None Based on Femur (Right) Neck BMD 1 -The 10-year probability of fracture may be lower than reported if the patient has received treatment. 2 -Major Osteoporotic  Fracture: Clinical Spine, Forearm, Hip or Shoulder *FRAX is a Armed forces logistics/support/administrative officer of the [employer] of  [employer] for Metabolic Bone Disease, a World Science writer (WHO) [employer]. ASSESSMENT: The probability of a major osteoporotic fracture is 6.0% within the next ten years. The probability of a hip fracture is 1.6% within the next ten years. Electronically Signed   By: Frederico Hamman M.D.   On: 12/04/2022 14:22    Assessment & Plan:  .Acquired hypothyroidism Assessment & Plan: TSH was suppressed on 75 mcg daily and underactive on 50 mcg daily.  I have asked her to reduce dose to 6 days per week   Lab Results  Component Value Date   TSH 3.30 09/26/2022      Essential hypertension -     Comprehensive metabolic panel  Hyperlipidemia, unspecified hyperlipidemia type  Bad odor of urine -     Urine Culture; Future -     Urinalysis, Routine w reflex microscopic  Need for influenza vaccination -     Flu Vaccine Trivalent High Dose (Fluad)  Generalized anxiety disorder Assessment & Plan: Aggravated  by several untimely deaths (brother  died in a fire,  nephew committed suicide). Increas alprazolam t allow a second dose daily prn.  The risks and benefits of benzodiazepine use were discussed with patient today including excessive sedation leading to respiratory depression,  impaired thinking/driving, and addiction.  Patient was advised to avoid concurrent use with alcohol, to use medication only as needed and not to share with others  .     Grief Assessment & Plan: Patient is dealing with the unexpected loss of  her last remaining sibling,  and hew nephew.   She has adequate coping skills and emotional support .  i have asked patient  to return in one month to examine for signs of unresolving grief.     Osteoporosis, postmenopausal Assessment & Plan: Forearm only..  Defers treatment.  Walking daily and using weightsForearm only..     Other orders -     Levothyroxine Sodium; TAKE 1 TABLET(50 MCG) BY MOUTH DAILY  Dispense: 90 tablet; Refill:  1 -     ALPRAZolam; Take 1 tablet (0.25 mg total) by mouth 2 (two) times daily as needed for anxiety or sleep.  Dispense: 60 tablet; Refill: 2     Follow-up: Return in about 3 months (around 06/19/2023) for grief, anxiety  and hypertension .   Sherlene Shams, MD

## 2023-03-21 NOTE — Patient Instructions (Signed)
I am so sorry for your loss  I have increased the  Quantity of your alprazolam for an additional dose daily if needed,     Please start monitoring your blood pressure at home and notify me if your readings are > 130/80     May the Lord give you peace and comfort during this holiday season, and may the promise of His return bring you strength and hope for the future.  Regards,   Duncan Dull, MD

## 2023-03-22 LAB — URINE CULTURE
MICRO NUMBER:: 15877131
Result:: NO GROWTH
SPECIMEN QUALITY:: ADEQUATE

## 2023-03-23 NOTE — Assessment & Plan Note (Signed)
Patient is dealing with the unexpected loss of  her last remaining sibling,  and hew nephew.   She has adequate coping skills and emotional support .  i have asked patient  to return in one month to examine for signs of unresolving grief.

## 2023-03-23 NOTE — Assessment & Plan Note (Signed)
TSH was suppressed on 75 mcg daily and underactive on 50 mcg daily.  I have asked her to reduce dose to 6 days per week   Lab Results  Component Value Date   TSH 3.30 09/26/2022

## 2023-03-23 NOTE — Assessment & Plan Note (Signed)
Aggravated  by several untimely deaths (brother  died in a fire,  nephew committed suicide). Increas alprazolam t allow a second dose daily prn.  The risks and benefits of benzodiazepine use were discussed with patient today including excessive sedation leading to respiratory depression,  impaired thinking/driving, and addiction.  Patient was advised to avoid concurrent use with alcohol, to use medication only as needed and not to share with others  .

## 2023-03-23 NOTE — Assessment & Plan Note (Signed)
Forearm only..  Defers treatment.  Walking daily and using weightsForearm only.Marland Kitchen

## 2023-03-28 ENCOUNTER — Telehealth: Payer: Self-pay

## 2023-03-28 ENCOUNTER — Ambulatory Visit (INDEPENDENT_AMBULATORY_CARE_PROVIDER_SITE_OTHER): Payer: Medicare Other | Admitting: *Deleted

## 2023-03-28 VITALS — Ht 60.0 in | Wt 125.0 lb

## 2023-03-28 DIAGNOSIS — Z0000 Encounter for general adult medical examination without abnormal findings: Secondary | ICD-10-CM | POA: Diagnosis not present

## 2023-03-28 NOTE — Telephone Encounter (Signed)
Visit was completed and documented.

## 2023-03-28 NOTE — Progress Notes (Signed)
Subjective:   Holly Perez is a 82 y.o. female who presents for Medicare Annual (Subsequent) preventive examination.  Visit Complete: Virtual I connected with  Holly Perez on 03/28/23 by a audio enabled telemedicine application and verified that I am speaking with the correct person using two identifiers.  Patient Location: Home  Provider Location: Home Office  I discussed the limitations of evaluation and management by telemedicine. The patient expressed understanding and agreed to proceed.  Vital Signs: Because this visit was a virtual/telehealth visit, some criteria may be missing or patient reported. Any vitals not documented were not able to be obtained and vitals that have been documented are patient reported.  Cardiac Risk Factors include: advanced age (>67men, >34 women);dyslipidemia     Objective:    Today's Vitals   03/28/23 0953  Weight: 125 lb (56.7 kg)  Height: 5' (1.524 m)   Body mass index is 24.41 kg/m.     03/28/2023   10:06 AM 07/04/2022   10:19 AM 03/21/2022    9:05 AM 02/15/2021   10:13 AM 09/21/2019   10:56 AM 02/12/2019   10:22 AM 01/14/2018    7:10 AM  Advanced Directives  Does Patient Have a Medical Advance Directive? No No No No No No Yes  Type of Tax inspector;Living will  Copy of Healthcare Power of Attorney in Chart?       No - copy requested  Would patient like information on creating a medical advance directive? No - Patient declined  No - Patient declined No - Patient declined No - Patient declined No - Patient declined     Current Medications (verified) Outpatient Encounter Medications as of 03/28/2023  Medication Sig   ALPRAZolam (XANAX) 0.25 MG tablet Take 1 tablet (0.25 mg total) by mouth 2 (two) times daily as needed for anxiety or sleep.   amLODipine (NORVASC) 10 MG tablet TAKE 1 TABLET(10 MG) BY MOUTH DAILY   aspirin 81 MG tablet Take 81 mg by mouth daily.   Calcium Carbonate-Vitamin D  (CALTRATE 600+D PO) Take 1,200 mg by mouth daily.    cholecalciferol (VITAMIN D) 1000 UNITS tablet Take 2,000 Units by mouth daily.   fish oil-omega-3 fatty acids 1000 MG capsule Take 1 g by mouth daily.    levothyroxine (SYNTHROID) 50 MCG tablet TAKE 1 TABLET(50 MCG) BY MOUTH DAILY   losartan (COZAAR) 50 MG tablet TAKE 1 TABLET(50 MG) BY MOUTH DAILY   Multiple Vitamin (MULTIVITAMIN) tablet Take 1 tablet by mouth daily.   traMADol (ULTRAM) 50 MG tablet TAKE 1 TABLET(50 MG) BY MOUTH TWICE DAILY FOR MODERATE PAINTAKE 1 TABLET(50 MG) BY MOUTH TWICE DAILY FOR MODERATE PAIN   vitamin E 100 UNIT capsule Take 100 Units by mouth daily.   No facility-administered encounter medications on file as of 03/28/2023.    Allergies (verified) Augmentin [amoxicillin-pot clavulanate] and Latex   History: Past Medical History:  Diagnosis Date   Asthma    Colon polyp 03/07/2010   2 mm tubular adenoma the ascending colon. Dr. Niel Hummer.    Hemorrhoids    Hypertension    Irritable bowel syndrome (IBS)    Past Surgical History:  Procedure Laterality Date   ABDOMINAL HYSTERECTOMY     COLONOSCOPY  2011   Dr. Unk Pinto: 2mm tubular adenoma of the ascending colon without dysplasia or malignancy.   COLONOSCOPY WITH PROPOFOL N/A 01/14/2018   Procedure: COLONOSCOPY WITH PROPOFOL;  Surgeon: Earline Mayotte, MD;  Location: [facility] ENDOSCOPY;  Service: Endoscopy;  Laterality: N/A;   ECTOPIC PREGNANCY SURGERY     at age 84   OVARY SURGERY  1974   tumor   Family History  Problem Relation Age of Onset   Early death Mother    COPD Father    Early death Brother    Breast cancer Neg Hx    Social History   Socioeconomic History   Marital status: Divorced    Spouse name: Not on file   Number of children: Not on file   Years of education: Not on file   Highest education level: Not on file  Occupational History   Not on file  Tobacco Use   Smoking status: Never   Smokeless tobacco: Never  Substance and  Sexual Activity   Alcohol use: No   Drug use: No   Sexual activity: Never  Other Topics Concern   Not on file  Social History Narrative   Not on file   Social Drivers of Health   Financial Resource Strain: Low Risk  (03/28/2023)   Overall Financial Resource Strain (CARDIA)    Difficulty of Paying Living Expenses: Not hard at all  Food Insecurity: No Food Insecurity (03/28/2023)   Hunger Vital Sign    Worried About Running Out of Food in the Last Year: Never true    Ran Out of Food in the Last Year: Never true  Transportation Needs: No Transportation Needs (03/28/2023)   PRAPARE - Administrator, Civil Service (Medical): No    Lack of Transportation (Non-Medical): No  Physical Activity: Insufficiently Active (03/28/2023)   Exercise Vital Sign    Days of Exercise per Week: 2 days    Minutes of Exercise per Session: 50 min  Stress: Stress Concern Present (03/28/2023)   [employer] of Occupational Health - Occupational Stress Questionnaire    Feeling of Stress : To some extent  Social Connections: Moderately Isolated (03/28/2023)   Social Connection and Isolation Panel [NHANES]    Frequency of Communication with Friends and Family: More than three times a week    Frequency of Social Gatherings with Friends and Family: More than three times a week    Attends Religious Services: More than 4 times per year    Active Member of [employer] or Organizations: No    Attends Engineer, structural: Never    Marital Status: Divorced    Tobacco Counseling Counseling given: Not Answered   Clinical Intake:  Pre-visit preparation completed: Yes  Pain : No/denies pain     BMI - recorded: 24.41 Nutritional Status: BMI of 19-24  Normal Nutritional Risks: None Diabetes: No  How often do you need to have someone help you when you read instructions, pamphlets, or other written materials from your doctor or pharmacy?: 1 - Never  Interpreter Needed?:  No  Information entered by :: R. Dannae Kato LPN   Activities of Daily Living    03/28/2023    9:55 AM  In your present state of health, do you have any difficulty performing the following activities:  Hearing? 0  Vision? 0  Comment glasses  Difficulty concentrating or making decisions? 0  Walking or climbing stairs? 0  Dressing or bathing? 0  Doing errands, shopping? 0  Preparing Food and eating ? N  Using the Toilet? N  In the past six months, have you accidently leaked urine? N  Do you have problems with loss of bowel control? N  Managing your Medications? N  Managing your Finances? N  Housekeeping or managing your Housekeeping? N    Patient Care Team: Sherlene Shams, MD as PCP - General (Internal Medicine) Sherlene Shams, MD (Internal Medicine) Lemar Livings Merrily Pew, MD (General Surgery)  Indicate any recent Medical Services you may have received from other than [facility] providers in the past year (date may be approximate).     Assessment:   This is a routine wellness examination for Holly Perez.  Hearing/Vision screen Hearing Screening - Comments:: No issues Vision Screening - Comments:: glasses   Goals Addressed             This Visit's Progress    Patient Stated       Wants to continue to stay healthy and be outgoing and exercise       Depression Screen    03/28/2023   10:00 AM 03/21/2023   10:52 AM 09/26/2022   10:44 AM 03/21/2022    9:00 AM 09/13/2021   11:07 AM 05/31/2021    8:10 AM 03/14/2021   10:28 AM  PHQ 2/9 Scores  PHQ - 2 Score 2 2 2 2 2  0 1  PHQ- 9 Score 3 3 4 4 3  0 1    Fall Risk    03/28/2023    9:57 AM 03/21/2023   10:52 AM 09/26/2022   10:44 AM 09/26/2022   10:13 AM 03/21/2022    9:03 AM  Fall Risk   Falls in the past year? 0 0 0 0 1  Number falls in past yr: 0 0 0 0 0  Injury with Fall? 0 0 0 0 0  Risk for fall due to : No Fall Risks No Fall Risks No Fall Risks No Fall Risks No Fall Risks  Follow up Falls prevention discussed;Falls  evaluation completed Falls evaluation completed Falls evaluation completed Falls evaluation completed Falls evaluation completed;Falls prevention discussed    MEDICARE RISK AT HOME: Medicare Risk at Home Any stairs in or around the home?: No If so, are there any without handrails?: No Home free of loose throw rugs in walkways, pet beds, electrical cords, etc?: Yes Adequate lighting in your home to reduce risk of falls?: Yes Life alert?: No Use of a cane, walker or w/c?: No Grab bars in the bathroom?: Yes Shower chair or bench in shower?: Yes Elevated toilet seat or a handicapped toilet?: Yes  Cognitive Function:    02/04/2018   10:44 AM 12/26/2016    2:22 PM 12/26/2015   10:17 AM  MMSE - Mini Mental State Exam  Orientation to time 5 5 5   Orientation to Place 5 5 5   Registration 3 3 3   Attention/ Calculation 5 5 5   Recall 3 2 3   Language- name 2 objects 2 2 2   Language- repeat 1 1 1   Language- follow 3 step command 3 3 3   Language- read & follow direction 1 1 1   Write a sentence 1 1 1   Copy design 1 1 1   Total score 30 29 30         03/28/2023   10:07 AM 03/21/2022    9:03 AM 02/15/2020    9:48 AM 02/12/2019   10:14 AM  6CIT Screen  What Year? 0 points 0 points 0 points 0 points  What month? 0 points 0 points 0 points 0 points  What time? 0 points 0 points 0 points 0 points  Count back from 20 0 points 0 points 0 points 0 points  Months in reverse 0 points  0 points 0 points 0 points  Repeat phrase 0 points 0 points 0 points 0 points  Total Score 0 points 0 points 0 points 0 points    Immunizations Immunization History  Administered Date(s) Administered   Fluad Quad(high Dose 65+) 02/17/2019, 01/02/2021, 12/20/2021   Fluad Trivalent(High Dose 65+) 03/21/2023   Influenza, High Dose Seasonal PF 12/26/2016, 01/05/2018, 02/01/2020   Influenza,inj,Quad PF,6+ Mos 12/18/2012, 01/06/2014, 12/01/2014, 12/26/2015   Moderna Covid-19 Vaccine Bivalent Booster 34yrs & up  03/01/2021   PFIZER(Purple Top)SARS-COV-2 Vaccination 05/27/2019, 06/22/2019, 01/15/2020   PPD Test 12/16/2012, 11/08/2014, 10/31/2015, 11/05/2016, 10/07/2017   Pneumococcal Conjugate-13 06/15/2015   Pneumococcal Polysaccharide-23 07/15/2004, 05/06/2018   Respiratory Syncytial Virus Vaccine,Recomb Aduvanted(Arexvy) 12/25/2022   Td 06/14/2013   Tdap 06/14/2013   Zoster Recombinant(Shingrix) 03/01/2021, 05/17/2021   Zoster, Live 06/08/2010    TDAP status: Up to date  Flu Vaccine status: Up to date  Pneumococcal vaccine status: Up to date  Covid-19 vaccine status: Completed vaccines  Qualifies for Shingles Vaccine? Yes   Zostavax completed Yes   Shingrix Completed?: Yes  Screening Tests Health Maintenance  Topic Date Due   Colonoscopy  01/14/2021   Medicare Annual Wellness (AWV)  03/22/2023   COVID-19 Vaccine (5 - 2024-25 season) 04/06/2023 (Originally 12/01/2022)   DTaP/Tdap/Td (3 - Td or Tdap) 06/15/2023   MAMMOGRAM  06/19/2023   Pneumonia Vaccine 76+ Years old  Completed   INFLUENZA VACCINE  Completed   DEXA SCAN  Completed   Zoster Vaccines- Shingrix  Completed   HPV VACCINES  Aged Out    Health Maintenance  Health Maintenance Due  Topic Date Due   Colonoscopy  01/14/2021   Medicare Annual Wellness (AWV)  03/22/2023    Colorectal cancer screening: No longer required. Patient declines   Mammogram status: Completed 05/2022. Repeat every year  Bone Density status: Completed 12/2022. Results reflect: Bone density results: OSTEOPENIA. Repeat every 2 years.  Lung Cancer Screening: (Low Dose CT Chest recommended if Age 72-80 years, 20 pack-year currently smoking OR have quit w/in 15years.) does not qualify.   Additional Screening:  Hepatitis C Screening: does not qualify; Completed NA age  Vision Screening: Recommended annual ophthalmology exams for early detection of glaucoma and other disorders of the eye. Is the patient up to date with their annual eye exam?  Yes   Who is the provider or what is the name of the office in which the patient attends annual eye exams? [facility] If pt is not established with a provider, would they like to be referred to a provider to establish care? No .   Dental Screening: Recommended annual dental exams for proper oral hygiene    Community Resource Referral / Chronic Care Management: CRR required this visit?  No   CCM required this visit?  No     Plan:     I have personally reviewed and noted the following in the patient's chart:   Medical and social history Use of alcohol, tobacco or illicit drugs  Current medications and supplements including opioid prescriptions. Patient is currently taking opioid prescriptions. Information provided to patient regarding non-opioid alternatives. Patient advised to discuss non-opioid treatment plan with their provider. Functional ability and status Nutritional status Physical activity Advanced directives List of other physicians Hospitalizations, surgeries, and ER visits in previous 12 months Vitals Screenings to include cognitive, depression, and falls Referrals and appointments  In addition, I have reviewed and discussed with patient certain preventive protocols, quality metrics, and best practice recommendations. A written  personalized care plan for preventive services as well as general preventive health recommendations were provided to patient.     Sydell Axon, LPN   [id number]   After Visit Summary: (MyChart) Due to this being a telephonic visit, the after visit summary with patients personalized plan was offered to patient via MyChart   Nurse Notes: None

## 2023-03-28 NOTE — Patient Instructions (Addendum)
Ms. Ransford , Thank you for taking time to come for your Medicare Wellness Visit. I appreciate your ongoing commitment to your health goals. Please review the following plan we discussed and let me know if I can assist you in the future.   Referrals/Orders/Follow-Ups/Clinician Recommendations: None  This is a list of the screening recommended for you and due dates:  Health Maintenance  Topic Date Due   Colon Cancer Screening  01/14/2021   COVID-19 Vaccine (5 - 2024-25 season) 04/06/2023*   DTaP/Tdap/Td vaccine (3 - Td or Tdap) 06/15/2023   Mammogram  06/19/2023   Medicare Annual Wellness Visit  03/27/2024   Pneumonia Vaccine  Completed   Flu Shot  Completed   DEXA scan (bone density measurement)  Completed   Zoster (Shingles) Vaccine  Completed   HPV Vaccine  Aged Out  *Topic was postponed. The date shown is not the original due date.    Advanced directives: (Declined) Advance directive discussed with you today. Even though you declined this today, please call our office should you change your mind, and we can give you the proper paperwork for you to fill out.  Next Medicare Annual Wellness Visit scheduled for next year: Yes 04/07/2024 @ 11:30 Managing Pain Without Opioids Opioids are strong medicines used to treat moderate to severe pain. For some people, especially those who have long-term (chronic) pain, opioids may not be the best choice for pain management due to: Side effects like nausea, constipation, and sleepiness. The risk of addiction (opioid use disorder). The longer you take opioids, the greater your risk of addiction. Pain that lasts for more than 3 months is called chronic pain. Managing chronic pain usually requires more than one approach and is often provided by a team of health care providers working together (multidisciplinary approach). Pain management may be done at a pain management center or pain clinic. How to manage pain without the use of opioids Use non-opioid  medicines Non-opioid medicines for pain may include: Over-the-counter or prescription non-steroidal anti-inflammatory drugs (NSAIDs). These may be the first medicines used for pain. They work well for muscle and bone pain, and they reduce swelling. Acetaminophen. This over-the-counter medicine may work well for milder pain but not swelling. Antidepressants. These may be used to treat chronic pain. A certain type of antidepressant (tricyclics) is often used. These medicines are given in lower doses for pain than when used for depression. Anticonvulsants. These are usually used to treat seizures but may also reduce nerve (neuropathic) pain. Muscle relaxants. These relieve pain caused by sudden muscle tightening (spasms). You may also use a pain medicine that is applied to the skin as a patch, cream, or gel (topical analgesic), such as a numbing medicine. These may cause fewer side effects than medicines taken by mouth. Do certain therapies as directed Some therapies can help with pain management. They include: Physical therapy. You will do exercises to gain strength and flexibility. A physical therapist may teach you exercises to move and stretch parts of your body that are weak, stiff, or painful. You can learn these exercises at physical therapy visits and practice them at home. Physical therapy may also involve: Massage. Heat wraps or applying heat or cold to affected areas. Electrical signals that interrupt pain signals (transcutaneous electrical nerve stimulation, TENS). Weak lasers that reduce pain and swelling (low-level laser therapy). Signals from your body that help you learn to regulate pain (biofeedback). Occupational therapy. This helps you to learn ways to function at home and work with  less pain. Recreational therapy. This involves trying new activities or hobbies, such as a physical activity or drawing. Mental health therapy, including: Cognitive behavioral therapy (CBT). This helps  you learn coping skills for dealing with pain. Acceptance and commitment therapy (ACT) to change the way you think and react to pain. Relaxation therapies, including muscle relaxation exercises and mindfulness-based stress reduction. Pain management counseling. This may be individual, family, or group counseling.  Receive medical treatments Medical treatments for pain management include: Nerve block injections. These may include a pain blocker and anti-inflammatory medicines. You may have injections: Near the spine to relieve chronic back or neck pain. Into joints to relieve back or joint pain. Into nerve areas that supply a painful area to relieve body pain. Into muscles (trigger point injections) to relieve some painful muscle conditions. A medical device placed near your spine to help block pain signals and relieve nerve pain or chronic back pain (spinal cord stimulation device). Acupuncture. Follow these instructions at home Medicines Take over-the-counter and prescription medicines only as told by your health care provider. If you are taking pain medicine, ask your health care providers about possible side effects to watch out for. Do not drive or use heavy machinery while taking prescription opioid pain medicine. Lifestyle  Do not use drugs or alcohol to reduce pain. If you drink alcohol, limit how much you have to: 0-1 drink a day for women who are not pregnant. 0-2 drinks a day for men. Know how much alcohol is in a drink. In the [place] one drink equals one 12 oz bottle of beer (355 mL), one 5 oz glass of wine (148 mL), or one 1 oz glass of hard liquor (44 mL). Do not use any products that contain nicotine or tobacco. These products include cigarettes, chewing tobacco, and vaping devices, such as e-cigarettes. If you need help quitting, ask your health care provider. Eat a healthy diet and maintain a healthy weight. Poor diet and excess weight may make pain worse. Eat foods that  are high in fiber. These include fresh fruits and vegetables, whole grains, and beans. Limit foods that are high in fat and processed sugars, such as fried and sweet foods. Exercise regularly. Exercise lowers stress and may help relieve pain. Ask your health care provider what activities and exercises are safe for you. If your health care provider approves, join an exercise class that combines movement and stress reduction. Examples include yoga and tai chi. Get enough sleep. Lack of sleep may make pain worse. Lower stress as much as possible. Practice stress reduction techniques as told by your therapist. General instructions Work with all your pain management providers to find the treatments that work best for you. You are an important member of your pain management team. There are many things you can do to reduce pain on your own. Consider joining an online or in-person support group for people who have chronic pain. Keep all follow-up visits. This is important. Where to find more information You can find more information about managing pain without opioids from: American Academy of Pain Medicine: painmed.org Institute for Chronic Pain: instituteforchronicpain.org American Chronic Pain Association: theacpa.org Contact a health care provider if: You have side effects from pain medicine. Your pain gets worse or does not get better with treatments or home therapy. You are struggling with anxiety or depression. Summary Many types of pain can be managed without opioids. Chronic pain may respond better to pain management without opioids. Pain is best managed when  you and a team of health care providers work together. Pain management without opioids may include non-opioid medicines, medical treatments, physical therapy, mental health therapy, and lifestyle changes. Tell your health care providers if your pain gets worse or is not being managed well enough. This information is not intended to  replace advice given to you by your health care provider. Make sure you discuss any questions you have with your health care provider. Document Revised: 06/28/2020 Document Reviewed: 06/28/2020 Elsevier Patient Education  2024 [employer].

## 2023-03-28 NOTE — Telephone Encounter (Signed)
Copied from CRM [id number]. Topic: Appointments - Scheduling Inquiry for Clinic >> Mar 28, 2023  9:52 AM Florestine Avers wrote: Reason for CRM: Pt has tele medicine appt @ 945. States no one has contacted her yet.

## 2023-04-04 ENCOUNTER — Other Ambulatory Visit: Payer: Self-pay | Admitting: Internal Medicine

## 2023-04-04 NOTE — Telephone Encounter (Signed)
 Refilled: 09/26/2022 Last OV: 03/21/2023 Next OV: 06/20/2023

## 2023-06-06 ENCOUNTER — Other Ambulatory Visit: Payer: Self-pay | Admitting: Internal Medicine

## 2023-06-20 ENCOUNTER — Encounter: Payer: Self-pay | Admitting: Internal Medicine

## 2023-06-20 ENCOUNTER — Ambulatory Visit: Payer: Medicare Other | Admitting: Internal Medicine

## 2023-06-20 VITALS — BP 124/68 | HR 79 | Ht 60.0 in | Wt 123.4 lb

## 2023-06-20 DIAGNOSIS — E785 Hyperlipidemia, unspecified: Secondary | ICD-10-CM

## 2023-06-20 DIAGNOSIS — R809 Proteinuria, unspecified: Secondary | ICD-10-CM | POA: Diagnosis not present

## 2023-06-20 DIAGNOSIS — R8290 Unspecified abnormal findings in urine: Secondary | ICD-10-CM | POA: Diagnosis not present

## 2023-06-20 DIAGNOSIS — E871 Hypo-osmolality and hyponatremia: Secondary | ICD-10-CM | POA: Diagnosis not present

## 2023-06-20 DIAGNOSIS — M1712 Unilateral primary osteoarthritis, left knee: Secondary | ICD-10-CM

## 2023-06-20 DIAGNOSIS — F411 Generalized anxiety disorder: Secondary | ICD-10-CM | POA: Diagnosis not present

## 2023-06-20 DIAGNOSIS — I10 Essential (primary) hypertension: Secondary | ICD-10-CM

## 2023-06-20 DIAGNOSIS — Z1231 Encounter for screening mammogram for malignant neoplasm of breast: Secondary | ICD-10-CM

## 2023-06-20 DIAGNOSIS — E039 Hypothyroidism, unspecified: Secondary | ICD-10-CM | POA: Diagnosis not present

## 2023-06-20 LAB — LIPID PANEL
Cholesterol: 163 mg/dL (ref 0–200)
HDL: 59.4 mg/dL (ref >39.00)
LDL Cholesterol: 92 mg/dL (ref 0–99)
NonHDL: 103.94
Total CHOL/HDL Ratio: 3
Triglycerides: 58 mg/dL (ref 0.0–149.0)
VLDL: 11.6 mg/dL (ref 0.0–40.0)

## 2023-06-20 LAB — COMPREHENSIVE METABOLIC PANEL
ALT: 14 U/L (ref 0–35)
AST: 25 U/L (ref 0–37)
Albumin: 4.2 g/dL (ref 3.5–5.2)
Alkaline Phosphatase: 52 U/L (ref 39–117)
BUN: 11 mg/dL (ref 6–23)
CO2: 28 meq/L (ref 19–32)
Calcium: 9.6 mg/dL (ref 8.4–10.5)
Chloride: 96 meq/L (ref 96–112)
Creatinine, Ser: 0.82 mg/dL (ref 0.40–1.20)
GFR: 65.9 mL/min (ref >60.00)
Glucose, Bld: 88 mg/dL (ref 70–99)
Potassium: 4.5 meq/L (ref 3.5–5.1)
Sodium: 130 meq/L — ABNORMAL LOW (ref 135–145)
Total Bilirubin: 0.3 mg/dL (ref 0.2–1.2)
Total Protein: 8.1 g/dL (ref 6.0–8.3)

## 2023-06-20 LAB — URINALYSIS, ROUTINE W REFLEX MICROSCOPIC
Bilirubin Urine: NEGATIVE
Nitrite: NEGATIVE
Specific Gravity, Urine: 1.015 (ref 1.000–1.030)
Total Protein, Urine: 30 — AB
Urine Glucose: NEGATIVE
Urobilinogen, UA: 1 (ref 0.0–1.0)
pH: 6 (ref 5.0–8.0)

## 2023-06-20 LAB — MICROALBUMIN / CREATININE URINE RATIO
Creatinine,U: 196.5 mg/dL
Microalb Creat Ratio: 82.2 mg/g — ABNORMAL HIGH (ref 0.0–30.0)
Microalb, Ur: 16.2 mg/dL — ABNORMAL HIGH (ref 0.0–1.9)

## 2023-06-20 LAB — LDL CHOLESTEROL, DIRECT: Direct LDL: 91 mg/dL

## 2023-06-20 LAB — TSH: TSH: 7.22 u[IU]/mL — ABNORMAL HIGH (ref 0.35–5.50)

## 2023-06-20 MED ORDER — AMLODIPINE BESYLATE 10 MG PO TABS
5.0000 mg | ORAL_TABLET | Freq: Every day | ORAL | 1 refills | Status: DC
Start: 2023-06-20 — End: 2023-09-29

## 2023-06-20 MED ORDER — LOSARTAN POTASSIUM 50 MG PO TABS: 100.0000 mg | ORAL_TABLET | Freq: Every day | ORAL | 1 refills | Status: DC

## 2023-06-20 MED ORDER — ALPRAZOLAM 0.25 MG PO TABS: 0.2500 mg | ORAL_TABLET | Freq: Two times a day (BID) | ORAL | 2 refills | Status: DC | PRN

## 2023-06-20 MED ORDER — TRAMADOL HCL 50 MG PO TABS: ORAL_TABLET | ORAL | 5 refills | Status: DC

## 2023-06-20 NOTE — Assessment & Plan Note (Signed)
managed with tramadol and behavior modication

## 2023-06-20 NOTE — Progress Notes (Signed)
 Subjective:  Patient ID: Holly Perez, female    DOB: Feb 11, 1941  Age: 83 y.o. MRN: [id number]  CC: The primary encounter diagnosis was Encounter for screening mammogram for malignant neoplasm of breast. Diagnoses of Acquired hypothyroidism, Hyperlipidemia, unspecified hyperlipidemia type, Essential hypertension, Bad odor of urine, White coat syndrome with hypertension, Primary osteoarthritis of left knee, and Generalized anxiety disorder were also pertinent to this visit.   HPI Holly Perez presents for  Chief Complaint  Patient presents with  . Medical Management of Chronic Issues    3 month follow up on anxiety and hypertension   1)  anxiety:  has had a difficult time since losing several family members last year.  Using alprazolam  at night for sleep and occasionally during the day   2) Hypertension: patient checks blood pressure twice weekly at home.  Readings have been for the most part 130/80  or higher . Patient is following a reduced salt diet most days and is taking medications as prescribed .  Reviewed recent UACR   Outpatient Medications Prior to Visit  Medication Sig Dispense Refill  . aspirin 81 MG tablet Take 81 mg by mouth daily.    . Calcium Carbonate-Vitamin D (CALTRATE 600+D PO) Take 1,200 mg by mouth daily.     . cholecalciferol (VITAMIN D) 1000 UNITS tablet Take 2,000 Units by mouth daily.    . fish oil-omega-3 fatty acids 1000 MG capsule Take 1 g by mouth daily.     Marland Kitchen levothyroxine (SYNTHROID) 50 MCG tablet TAKE 1 TABLET(50 MCG) BY MOUTH DAILY 90 tablet 1  . Multiple Vitamin (MULTIVITAMIN) tablet Take 1 tablet by mouth daily.    . vitamin E 100 UNIT capsule Take 100 Units by mouth daily.    Marland Kitchen ALPRAZolam (XANAX) 0.25 MG tablet Take 1 tablet (0.25 mg total) by mouth 2 (two) times daily as needed for anxiety or sleep. 60 tablet 2  . amLODipine (NORVASC) 10 MG tablet TAKE 1 TABLET(10 MG) BY MOUTH DAILY 90 tablet 1  . losartan (COZAAR) 50 MG tablet TAKE 1 TABLET(50  MG) BY MOUTH DAILY 90 tablet 1  . traMADol (ULTRAM) 50 MG tablet TAKE 1 TABLET BY MOUTH TWICE DAILY FOR MODERATE PAIN 60 tablet 5   No facility-administered medications prior to visit.    Review of Systems;  Patient denies headache, fevers, malaise, unintentional weight loss, skin rash, eye pain, sinus congestion and sinus pain, sore throat, dysphagia,  hemoptysis , cough, dyspnea, wheezing, chest pain, palpitations, orthopnea, edema, abdominal pain, nausea, melena, diarrhea, constipation, flank pain, dysuria, hematuria, urinary  Frequency, nocturia, numbness, tingling, seizures,  Focal weakness, Loss of consciousness,  Tremor, insomnia,  and suicidal ideation.      Objective:  BP 124/68   Pulse 79   Ht 5' (1.524 m)   Wt 123 lb 6.4 oz (56 kg)   SpO2 95%   BMI 24.10 kg/m   BP Readings from Last 3 Encounters:  06/20/23 124/68  03/21/23 (!) 158/82  09/26/22 132/72    Wt Readings from Last 3 Encounters:  06/20/23 123 lb 6.4 oz (56 kg)  03/28/23 125 lb (56.7 kg)  03/21/23 125 lb 9.6 oz (57 kg)    Physical Exam Vitals reviewed.  Constitutional:      General: She is not in acute distress.    Appearance: Normal appearance. She is normal weight. She is not ill-appearing, toxic-appearing or diaphoretic.  HENT:     Head: Normocephalic.  Eyes:     General:  No scleral icterus.       Right eye: No discharge.        Left eye: No discharge.     Conjunctiva/sclera: Conjunctivae normal.  Cardiovascular:     Rate and Rhythm: Normal rate and regular rhythm.     Heart sounds: Normal heart sounds.  Pulmonary:     Effort: Pulmonary effort is normal. No respiratory distress.     Breath sounds: Normal breath sounds.  Musculoskeletal:        General: Normal range of motion.  Skin:    General: Skin is warm and dry.  Neurological:     General: No focal deficit present.     Mental Status: She is alert and oriented to person, place, and time. Mental status is at baseline.  Psychiatric:         Mood and Affect: Mood normal.        Behavior: Behavior normal.        Thought Content: Thought content normal.        Judgment: Judgment normal.   Lab Results  Component Value Date   HGBA1C 5.7 09/26/2022   HGBA1C 5.9 09/13/2021    Lab Results  Component Value Date   CREATININE 0.82 06/20/2023   CREATININE 0.74 03/21/2023   CREATININE 0.78 09/26/2022    Lab Results  Component Value Date   WBC 4.1 09/26/2022   HGB 12.3 09/26/2022   HCT 38.1 09/26/2022   PLT 395.0 09/26/2022   GLUCOSE 88 06/20/2023   CHOL 163 06/20/2023   TRIG 58.0 06/20/2023   HDL 59.40 06/20/2023   LDLDIRECT 91.0 06/20/2023   LDLCALC 92 06/20/2023   ALT 14 06/20/2023   AST 25 06/20/2023   NA 130 (L) 06/20/2023   K 4.5 06/20/2023   CL 96 06/20/2023   CREATININE 0.82 06/20/2023   BUN 11 06/20/2023   CO2 28 06/20/2023   TSH 7.22 (H) 06/20/2023   HGBA1C 5.7 09/26/2022   MICROALBUR 16.2 (H) 06/20/2023    DG Bone Density Result Date: 12/04/2022 EXAM: DUAL X-RAY ABSORPTIOMETRY (DXA) FOR BONE MINERAL DENSITY IMPRESSION: Your patient Holly Perez completed a BMD test on 12/04/2022 using the [employer] DXA System (software version: 14.10) manufactured by [employer]. The following summarizes the results of our evaluation. Technologist: SCE PATIENT BIOGRAPHICAL: Name: Holly Perez Patient ID:  [id number] Birth Date: August 31, 1940 Height:     61.5 in. Gender:      Female Exam Date:  12/04/2022 Weight:     125.6 lbs. Indications: Advanced Age, Asthma, Height Loss, Hypothyroid, Hysterectomy, Oophorectomy Bilateral, Osteoporotic, Postmenopausal Fractures: Treatments: caltrate, Flonase, Levothyroxine, Multi-Vitamin, Vitamin D DENSITOMETRY RESULTS: Site      Region     Measured Date Measured Age WHO Classification Young Adult T-score BMD         %Change vs. Previous Significant Change (*) AP Spine L1-L4 12/04/2021 83.4 Osteopenia -2.0 0.952 g/cm2 -2.4% - AP Spine L1-L4 03/05/2018 78.6 Osteopenia -1.8 0.975  g/cm2 - - DualFemur Neck Right 12/04/2022 83.4 Osteopenia -1.5 0.831 g/cm2 -5.7% - DualFemur Neck Right 03/05/2018 78.6 Osteopenia -1.1 0.881 g/cm2 - - DualFemur Total Mean 12/04/2022 83.4 Normal -0.9 0.891 g/cm2 -0.9% - DualFemur Total Mean 03/05/2018 78.6 Normal -0.9 0.899 g/cm2 - - ASSESSMENT: The BMD measured at AP Spine L1-L4 is 0.952 g/cm2 with a T-score of -2.0. This patient is considered osteopenic according to World Health Organization [facility]) criteria. The scan quality is good. Compared with prior study, there has been no significant change in  the spine. Compared with prior study, there has been no significant change in the total hip. World Science writer [facility]) criteria for post-menopausal, Caucasian Women: Normal:                   T-score at or above -1 SD Osteopenia/low bone mass: T-score between -1 and -2.5 SD Osteoporosis:             T-score at or below -2.5 SD RECOMMENDATIONS: 1. All patients should optimize calcium and vitamin D intake. 2. Consider FDA-approved medical therapies in postmenopausal women and men aged 55 years and older, based on the following: a. A hip or vertebral(clinical or morphometric) fracture b. T-score < -2.5 at the femoral neck or spine after appropriate evaluation to exclude secondary causes c. Low bone mass (T-score between -1.0 and -2.5 at the femoral neck or spine) and a 10-year probability of a hip fracture > 3% or a 10-year probability of a major osteoporosis-related fracture > 20% based on the [place]-adapted WHO algorithm 3. Clinician judgment and/or patient preferences may indicate treatment for people with 10-year fracture probabilities above or below these levels FOLLOW-UP: People with diagnosed cases of osteoporosis or at high risk for fracture should have regular bone mineral density tests. For patients eligible for Medicare, routine testing is allowed once every 2 years. The testing frequency can be increased to one year for patients who have rapidly progressing  disease, those who are receiving or discontinuing medical therapy to restore bone mass, or have additional risk factors. I have reviewed this report, and agree with the above findings. [facility] Dear Sherlene Shams, Your patient JEANNIE MALLINGER completed a FRAX assessment on 12/04/2022 using the [facility] iDXA DXA System (analysis version: 14.10) manufactured by [employer]. The following summarizes the results of our evaluation. PATIENT BIOGRAPHICAL: Name: Tyniya, Kuyper Patient ID: [id number] Birth Date: 1940/07/28 Height:    61.5 in. Gender:     Female    [age]       Weight:    125.6 lbs. Ethnicity:  Black                            Exam Date: 12/04/2022 FRAX* RESULTS:  (version: 3.5) 10-year Probability of Fracture1 Major Osteoporotic Fracture2 Hip Fracture 6.0% 1.6% Population: [place] (Black) Risk Factors: None Based on Femur (Right) Neck BMD 1 -The 10-year probability of fracture may be lower than reported if the patient has received treatment. 2 -Major Osteoporotic Fracture: Clinical Spine, Forearm, Hip or Shoulder *FRAX is a Armed forces logistics/support/administrative officer of the [employer] for Metabolic Bone Disease, a World Science writer (WHO) [employer]. ASSESSMENT: The probability of a major osteoporotic fracture is 6.0% within the next ten years. The probability of a hip fracture is 1.6% within the next ten years. Electronically Signed   By: Frederico Hamman M.D.   On: 12/04/2022 14:22    Assessment & Plan:  .Encounter for screening mammogram for malignant neoplasm of breast -     3D Screening Mammogram, Left and Right; Future  Acquired hypothyroidism -     TSH  Hyperlipidemia, unspecified hyperlipidemia type -     Lipid panel -     LDL cholesterol, direct  Essential hypertension -     Microalbumin / creatinine urine ratio -     Comprehensive metabolic panel  Bad odor of urine -  Urinalysis, Routine w reflex microscopic  White coat syndrome  with hypertension Assessment & Plan: Corrected UACR from June 224 is 128.  Advised to increase losartan dose to 100 mg  and decrease amlodipine to 5 mg    Primary osteoarthritis of left knee Assessment & Plan: managed with tramadol and behavior modication    Generalized anxiety disorder Assessment & Plan: Aggravated  by several untimely deaths (brother  died in a fire,  nephew committed suicide). Imanaged with alprazolam .   The risks and benefits of benzodiazepine use were discussed with patient today including excessive sedation leading to respiratory depression,  impaired thinking/driving, and addiction.  Patient was advised to avoid concurrent use with alcohol, to use medication only as needed and not to share with others  .     Other orders -     amLODIPine Besylate; Take 0.5 tablets (5 mg total) by mouth daily. TAKE 1 TABLET(10 MG) BY MOUTH DAILY  Dispense: 90 tablet; Refill: 1 -     Losartan Potassium; Take 2 tablets (100 mg total) by mouth daily.  Dispense: 90 tablet; Refill: 1 -     ALPRAZolam; Take 1 tablet (0.25 mg total) by mouth 2 (two) times daily as needed for anxiety or sleep.  Dispense: 60 tablet; Refill: 2 -     traMADol HCl; TAKE 1 TABLET BY MOUTH TWICE DAILY FOR MODERATE PAIN  Dispense: 60 tablet; Refill: 5     I spent 34 minutes on the day of this face to face encounter reviewing patient's  most recent visit with cardiology,  nephrology,  and neurology,  prior relevant surgical and non surgical procedures, recent  labs and imaging studies, counseling on weight management,  reviewing the assessment and plan with patient, and post visit ordering and reviewing of  diagnostics and therapeutics with patient  .   Follow-up: Return in about 4 weeks (around 07/18/2023) for hypertension.   Sherlene Shams, MD

## 2023-06-20 NOTE — Assessment & Plan Note (Addendum)
 Corrected UACR from June 224 is 128.  Advised to increase losartan dose to 100 mg  and decrease amlodipine to 5 mg

## 2023-06-20 NOTE — Patient Instructions (Addendum)
 YOUR MAMMOGRAM IS DUE, PLEASE CALL AND GET THIS SCHEDULED! [facility] - call [phone]   Today we talked about the message from [facility] you received about an error in the calculation on the  urine test that measures the albumin to creatinine ratio .  This test  helps [place] determine if early kidney disease is present. I have reviewed your most recent screening test for nephropathy, which was done in June .  Your corrected  ratio is high   You are on the right medications,  and your BP is close to goal,  but the high ratio means your kidneys are being affected,  so your losartan dose needs to be increased and your amlodipine dose decreased.  Increasing the losartan will  help  reduce the ratio  and provide protection to your kidneys .    Starting tomorrow here is your regimen   Start taking your your losartan dose of  100 mg  (2 tablets)  at bedtime ,  and take 5 mg of amlodipine in the morning (1/2 tablet )    Our goal is get your BP down to 120/70 and keep it there.

## 2023-06-20 NOTE — Assessment & Plan Note (Signed)
 Aggravated  by several untimely deaths (brother  died in a fire,  nephew committed suicide). Imanaged with alprazolam .   The risks and benefits of benzodiazepine use were discussed with patient today including excessive sedation leading to respiratory depression,  impaired thinking/driving, and addiction.  Patient was advised to avoid concurrent use with alcohol, to use medication only as needed and not to share with others  .

## 2023-06-22 NOTE — Assessment & Plan Note (Addendum)
 UaCR has dropped but is still > 30.  Continue losartan, but increase to 100 mg daily and reducie ose o f amlodipine to 5 mg   Lab Results  Component Value Date   MICROALBUR 16.2 (H) 06/20/2023   MICROALBUR 19.2 (H) 09/26/2022

## 2023-06-23 ENCOUNTER — Encounter: Payer: Self-pay | Admitting: Internal Medicine

## 2023-06-23 MED ORDER — LEVOTHYROXINE SODIUM 50 MCG PO TABS
ORAL_TABLET | ORAL | 1 refills | Status: DC
Start: 2023-06-23 — End: 2023-11-27

## 2023-06-23 NOTE — Assessment & Plan Note (Addendum)
 Thyroid function is again underactive on 50  on 50 mcg daily.  I will have her take 2 on Sundays    Lab Results  Component Value Date   TSH 7.22 (H) 06/20/2023

## 2023-06-23 NOTE — Assessment & Plan Note (Signed)
 Stable , mild  present since 2011.  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  Managed with liberalization of  salt in diet and maintainenance  good hydration with 60 ounces of water daily   Lab Results  Component Value Date   NA 130 (L) 06/20/2023   K 4.5 06/20/2023   CL 96 06/20/2023   CO2 28 06/20/2023

## 2023-06-23 NOTE — Addendum Note (Signed)
 Addended by: Sherlene Shams on: 06/23/2023 06:32 AM   Modules accepted: Orders

## 2023-07-04 ENCOUNTER — Ambulatory Visit
Admission: RE | Admit: 2023-07-04 | Discharge: 2023-07-04 | Disposition: A | Discharge status: Home / Self Care | Source: Ambulatory Visit | Attending: Internal Medicine | Admitting: Internal Medicine

## 2023-07-04 DIAGNOSIS — Z1231 Encounter for screening mammogram for malignant neoplasm of breast: Secondary | ICD-10-CM | POA: Diagnosis not present

## 2023-07-14 ENCOUNTER — Other Ambulatory Visit: Payer: Self-pay | Admitting: Internal Medicine

## 2023-07-25 ENCOUNTER — Encounter: Payer: Self-pay | Admitting: Internal Medicine

## 2023-07-25 ENCOUNTER — Ambulatory Visit (INDEPENDENT_AMBULATORY_CARE_PROVIDER_SITE_OTHER): Admitting: Internal Medicine

## 2023-07-25 VITALS — BP 128/78 | HR 85 | Ht 60.0 in | Wt 122.4 lb

## 2023-07-25 DIAGNOSIS — R8290 Unspecified abnormal findings in urine: Secondary | ICD-10-CM

## 2023-07-25 DIAGNOSIS — R809 Proteinuria, unspecified: Secondary | ICD-10-CM | POA: Diagnosis not present

## 2023-07-25 DIAGNOSIS — M545 Low back pain, unspecified: Secondary | ICD-10-CM

## 2023-07-25 DIAGNOSIS — M546 Pain in thoracic spine: Secondary | ICD-10-CM

## 2023-07-25 DIAGNOSIS — M4000 Postural kyphosis, site unspecified: Secondary | ICD-10-CM | POA: Diagnosis not present

## 2023-07-25 DIAGNOSIS — I10 Essential (primary) hypertension: Secondary | ICD-10-CM

## 2023-07-25 DIAGNOSIS — F418 Other specified anxiety disorders: Secondary | ICD-10-CM | POA: Diagnosis not present

## 2023-07-25 MED ORDER — ALPRAZOLAM 0.25 MG PO TABS: 0.2500 mg | ORAL_TABLET | Freq: Every day | ORAL | 5 refills | Status: DC

## 2023-07-25 NOTE — Progress Notes (Unsigned)
 Subjective:  Patient ID: Holly Perez, female    DOB: [birth year]  Age: 83 y.o. MRN: [id number]  CC: There were no encounter diagnoses.   HPI Holly Perez presents for  Chief Complaint  Patient presents with  . Medical Management of Chronic Issues    4 week follow up    1) White coat syndrome with HTN:  Holly Perez was seen 4 weeks ago and medication regimen was changed due to worsening proteinuria.  Losartan  was increased to 100 mg and amlodipine  reduced to 5 mg daily.  Yesterday's home reading was 128/78,   another recent one was 126/80 at the pharmacy   2) Anxiety disorder:  aggravated by grief over the loss of several family members.   Has reduced use of alprazola to nighttime only  3) Back pain:  she has had right sided thoracic pain for  years,  intermittent ,  aggravated by certain positions . No prior films,  using tramadol  due to history of hypertension, proteinuria/    Outpatient Medications Prior to Visit  Medication Sig Dispense Refill  . ALPRAZolam  (XANAX ) 0.25 MG tablet Take 1 tablet (0.25 mg total) by mouth 2 (two) times daily as needed for anxiety or sleep. 60 tablet 2  . amLODipine  (NORVASC ) 10 MG tablet Take 0.5 tablets (5 mg total) by mouth daily. TAKE 1 TABLET(10 MG) BY MOUTH DAILY 90 tablet 1  . aspirin 81 MG tablet Take 81 mg by mouth daily.    . Calcium Carbonate-Vitamin D  (CALTRATE 600+D PO) Take 1,200 mg by mouth daily.     . cholecalciferol (VITAMIN D ) 1000 UNITS tablet Take 2,000 Units by mouth daily.    . fish oil-omega-3 fatty acids 1000 MG capsule Take 1 g by mouth daily.     . levothyroxine  (SYNTHROID ) 50 MCG tablet TAKE 1 TABLET(50 MCG) BY MOUTH DAILY AND 2 TABLETS ON  SUNDAYS 103 tablet 1  . losartan  (COZAAR ) 50 MG tablet TAKE 1 TABLET(50 MG) BY MOUTH DAILY 90 tablet 1  . Multiple Vitamin (MULTIVITAMIN) tablet Take 1 tablet by mouth daily.    . traMADol  (ULTRAM ) 50 MG tablet TAKE 1 TABLET BY MOUTH TWICE DAILY FOR MODERATE PAIN 60 tablet 5  . vitamin E  100 UNIT capsule Take 100 Units by mouth daily.     No facility-administered medications prior to visit.    Review of Systems;  Patient denies headache, fevers, malaise, unintentional weight loss, skin rash, eye pain, sinus congestion and sinus pain, sore throat, dysphagia,  hemoptysis , cough, dyspnea, wheezing, chest pain, palpitations, orthopnea, edema, abdominal pain, nausea, melena, diarrhea, constipation, flank pain, dysuria, hematuria, urinary  Frequency, nocturia, numbness, tingling, seizures,  Focal weakness, Loss of consciousness,  Tremor, insomnia, depression, anxiety, and suicidal ideation.      Objective:  BP 128/78   Pulse 85   Ht 5' (1.524 m)   Wt 122 lb 6.4 oz (55.5 kg)   SpO2 99%   BMI 23.90 kg/m   BP Readings from Last 3 Encounters:  07/25/23 128/78  06/20/23 124/68  03/21/23 (!) 158/82    Wt Readings from Last 3 Encounters:  07/25/23 122 lb 6.4 oz (55.5 kg)  06/20/23 123 lb 6.4 oz (56 kg)  03/28/23 125 lb (56.7 kg)    Physical Exam Vitals reviewed.  Constitutional:      General: She is not in acute distress.    Appearance: Normal appearance. She is normal weight. She is not ill-appearing, toxic-appearing or diaphoretic.  HENT:  Head: Normocephalic.  Eyes:     General: No scleral icterus.       Right eye: No discharge.        Left eye: No discharge.     Conjunctiva/sclera: Conjunctivae normal.  Cardiovascular:     Rate and Rhythm: Normal rate and regular rhythm.     Heart sounds: Normal heart sounds.  Pulmonary:     Effort: Pulmonary effort is normal. No respiratory distress.     Breath sounds: Normal breath sounds.  Musculoskeletal:        General: Normal range of motion.  Skin:    General: Skin is warm and dry.  Neurological:     General: No focal deficit present.     Mental Status: She is alert and oriented to person, place, and time. Mental status is at baseline.  Psychiatric:        Mood and Affect: Mood normal.        Behavior:  Behavior normal.        Thought Content: Thought content normal.        Judgment: Judgment normal.   Lab Results  Component Value Date   HGBA1C 5.7 09/26/2022   HGBA1C 5.9 09/13/2021    Lab Results  Component Value Date   CREATININE 0.82 06/20/2023   CREATININE 0.74 03/21/2023   CREATININE 0.78 09/26/2022    Lab Results  Component Value Date   WBC 4.1 09/26/2022   HGB 12.3 09/26/2022   HCT 38.1 09/26/2022   PLT 395.0 09/26/2022   GLUCOSE 88 06/20/2023   CHOL 163 06/20/2023   TRIG 58.0 06/20/2023   HDL 59.40 06/20/2023   LDLDIRECT 91.0 06/20/2023   LDLCALC 92 06/20/2023   ALT 14 06/20/2023   AST 25 06/20/2023   NA 130 (L) 06/20/2023   K 4.5 06/20/2023   CL 96 06/20/2023   CREATININE 0.82 06/20/2023   BUN 11 06/20/2023   CO2 28 06/20/2023   TSH 7.22 (H) 06/20/2023   HGBA1C 5.7 09/26/2022   MICROALBUR 16.2 (H) 06/20/2023    MM 3D SCREENING MAMMOGRAM BILATERAL BREAST Result Date: 07/08/2023 CLINICAL DATA:  Screening. EXAM: DIGITAL SCREENING BILATERAL MAMMOGRAM WITH TOMOSYNTHESIS AND CAD TECHNIQUE: Bilateral screening digital craniocaudal and mediolateral oblique mammograms were obtained. Bilateral screening digital breast tomosynthesis was performed. The images were evaluated with computer-aided detection. COMPARISON:  Previous exam(s). ACR Breast Density Category b: There are scattered areas of fibroglandular density. FINDINGS: There are no findings suspicious for malignancy. IMPRESSION: No mammographic evidence of malignancy. A result letter of this screening mammogram will be mailed directly to the patient. RECOMMENDATION: Screening mammogram in one year. (Code:[id number]-B-01Y) BI-RADS CATEGORY  1: Negative. Electronically Signed   By: Holly Perez M.D.   On: 07/08/2023 15:40    Assessment & Plan:  .There are no diagnoses linked to this encounter.   I spent 34 minutes on the day of this face to face encounter reviewing patient's  most recent visit with cardiology,   nephrology,  and neurology,  prior relevant surgical and non surgical procedures, recent  labs and imaging studies, counseling on weight management,  reviewing the assessment and plan with patient, and post visit ordering and reviewing of  diagnostics and therapeutics with patient  .   Follow-up: No follow-ups on file.   Thersia Flax, MD

## 2023-07-25 NOTE — Progress Notes (Signed)
 Holly Perez

## 2023-07-25 NOTE — Patient Instructions (Signed)
 Please schedule a lab appt in mid May to recheck  your thyroid  function  You can submit  a urine specimen  in the future if you develop a bad odor or other symptoms of UTI  using the sterile  container I gave you today  The x rays can be scheduled  anytime,  just call  on the day you want to have them make sure we have someone to do it on the day  you plan to come in  You have "white coat Hypertension"  . It is common.  Your home readings will be what we use to  monitor your blood pressure

## 2023-07-27 NOTE — Assessment & Plan Note (Signed)
 Complicated by grief following the tragic death of her brother,  her last remaining sibling,  in feburary Aug 30, 2022 (he died in a fire that was suspected to be arson ) . She has not tolerated trials of SSRIs .  She is using alprazolam  responsibly and has reduced use to once daily at bedtime .   The risks and benefits of benzodiazepine use were reviewed  with patient today including excessive sedation leading to respiratory depression,  impaired thinking/driving, and addiction.  Patient has had no withdrawal symptoms during periods of unintentional suspension. She was reminded to avoid concurrent use with alcohol, to use medication only as needed and not to share with others  .

## 2023-07-27 NOTE — Assessment & Plan Note (Signed)
 Well controlled on current regimen based on home readings . Renal function stable, no changes today.   Lab Results  Component Value Date   CREATININE 0.82 06/20/2023   Lab Results  Component Value Date   NA 130 (L) 06/20/2023   K 4.5 06/20/2023   CL 96 06/20/2023   CO2 28 06/20/2023

## 2023-07-27 NOTE — Assessment & Plan Note (Signed)
 UaCR has dropped but is still > 30.  Tolerating increased dose of losartan  to  100 mg daily and reduced dose of amlodipine  to 5 mg   Lab Results  Component Value Date   MICROALBUR 16.2 (H) 06/20/2023   MICROALBUR 19.2 (H) 09/26/2022

## 2023-07-27 NOTE — Assessment & Plan Note (Signed)
Managed with exercise,  Weight training,  Tylenol and prn Tramadol .  She has not had any ER visits  And has not requested any early refills.  Her Refill history was confirmed via [place] Controlled Substance database by me today during her visit and there have been no prescriptions of controlled substances filled from any providers other than me. .  

## 2023-08-12 ENCOUNTER — Telehealth: Payer: Self-pay | Admitting: Internal Medicine

## 2023-08-19 ENCOUNTER — Telehealth: Payer: Self-pay | Admitting: Internal Medicine

## 2023-08-19 DIAGNOSIS — E039 Hypothyroidism, unspecified: Secondary | ICD-10-CM

## 2023-08-19 NOTE — Telephone Encounter (Signed)
 Patient need orders, note states fasting labs. There are only urine orders in

## 2023-08-22 ENCOUNTER — Other Ambulatory Visit (INDEPENDENT_AMBULATORY_CARE_PROVIDER_SITE_OTHER)

## 2023-08-22 DIAGNOSIS — E039 Hypothyroidism, unspecified: Secondary | ICD-10-CM | POA: Diagnosis not present

## 2023-08-22 LAB — TSH: TSH: 1.83 u[IU]/mL (ref 0.35–5.50)

## 2023-08-25 ENCOUNTER — Ambulatory Visit: Payer: Self-pay | Admitting: Internal Medicine

## 2023-09-08 ENCOUNTER — Telehealth: Payer: Self-pay | Admitting: Internal Medicine

## 2023-09-08 MED ORDER — LOSARTAN POTASSIUM 50 MG PO TABS
50.0000 mg | ORAL_TABLET | Freq: Every day | ORAL | 1 refills | Status: DC
Start: 2023-09-08 — End: 2023-09-08

## 2023-09-08 MED ORDER — LOSARTAN POTASSIUM 50 MG PO TABS
100.0000 mg | ORAL_TABLET | Freq: Every day | ORAL | 1 refills | Status: AC
Start: 2023-09-08 — End: ?

## 2023-09-08 NOTE — Telephone Encounter (Signed)
 Prescription Request  09/08/2023  LOV: 07/25/2023  What is the name of the medication or equipment?  losartan  (COZAAR ) 50 MG tablet   Have you contacted your pharmacy to request a refill? Yes   Which pharmacy would you like this sent to?  [id number] [place], [place] - [address] AT [facility] [address] [place] [address] Phone: [phone] Fax: [phone]    Patient notified that their request is being sent to the clinical staff for review and that they should receive a response within 2 business days.   Please advise at [facility] [phone]   Pt states she has 1 pill left. As per notes in pt's last AVS, she was to start taking 2 pills/day (100 mg), will need new script. Script last written in April still states 1 pill/50 mg daily.. Pt would like someone to reach out to her when new script is sent. [facility]

## 2023-09-08 NOTE — Telephone Encounter (Signed)
 Per last office note pt is taking 2 50 mg tablets daily and was advised to continue current regimen. I have refilled the medication with the correct directions of use and quantity.  LMTCB to let pt know that medication has been sent in.

## 2023-09-29 ENCOUNTER — Other Ambulatory Visit: Payer: Self-pay

## 2023-09-29 ENCOUNTER — Other Ambulatory Visit: Payer: Self-pay | Admitting: Internal Medicine

## 2023-09-29 ENCOUNTER — Telehealth: Payer: Self-pay

## 2023-09-29 MED ORDER — AMLODIPINE BESYLATE 5 MG PO TABS: 5.0000 mg | ORAL_TABLET | Freq: Every day | ORAL | 1 refills | Status: DC

## 2023-09-29 MED ORDER — AMLODIPINE BESYLATE 10 MG PO TABS: 5.0000 mg | ORAL_TABLET | Freq: Every day | ORAL | 1 refills | Status: DC

## 2023-09-29 NOTE — Telephone Encounter (Signed)
 Copied from CRM [id number]. Topic: Clinical - Prescription Issue >> Sep 29, 2023  2:38 PM Thersia BROCKS wrote: Reason for CRM: [facility] called in regarding patient amLODipine  (NORVASC ) 10 MG tablet One says 1 tablet a day and other says 1/2 so needs to know what is correct , need a new prescription sent in

## 2023-09-29 NOTE — Telephone Encounter (Signed)
 Corrected rx has been sent in and pt is aware.

## 2023-10-15 NOTE — Telephone Encounter (Signed)
 SABRA

## 2023-11-26 ENCOUNTER — Ambulatory Visit: Admitting: Internal Medicine

## 2023-11-27 ENCOUNTER — Ambulatory Visit (INDEPENDENT_AMBULATORY_CARE_PROVIDER_SITE_OTHER)

## 2023-11-27 ENCOUNTER — Ambulatory Visit (INDEPENDENT_AMBULATORY_CARE_PROVIDER_SITE_OTHER): Admitting: Internal Medicine

## 2023-11-27 ENCOUNTER — Encounter: Payer: Self-pay | Admitting: Internal Medicine

## 2023-11-27 VITALS — BP 130/78 | HR 83 | Ht 60.0 in | Wt 124.6 lb

## 2023-11-27 DIAGNOSIS — M546 Pain in thoracic spine: Secondary | ICD-10-CM | POA: Diagnosis not present

## 2023-11-27 DIAGNOSIS — F418 Other specified anxiety disorders: Secondary | ICD-10-CM

## 2023-11-27 DIAGNOSIS — M40204 Unspecified kyphosis, thoracic region: Secondary | ICD-10-CM | POA: Diagnosis not present

## 2023-11-27 DIAGNOSIS — M545 Low back pain, unspecified: Secondary | ICD-10-CM

## 2023-11-27 DIAGNOSIS — I10 Essential (primary) hypertension: Secondary | ICD-10-CM

## 2023-11-27 DIAGNOSIS — G8929 Other chronic pain: Secondary | ICD-10-CM | POA: Diagnosis not present

## 2023-11-27 DIAGNOSIS — E871 Hypo-osmolality and hyponatremia: Secondary | ICD-10-CM | POA: Diagnosis not present

## 2023-11-27 DIAGNOSIS — E039 Hypothyroidism, unspecified: Secondary | ICD-10-CM | POA: Diagnosis not present

## 2023-11-27 DIAGNOSIS — M4000 Postural kyphosis, site unspecified: Secondary | ICD-10-CM | POA: Diagnosis not present

## 2023-11-27 LAB — COMPREHENSIVE METABOLIC PANEL WITH GFR
ALT: 20 U/L (ref 0–35)
AST: 30 U/L (ref 0–37)
Albumin: 3.9 g/dL (ref 3.5–5.2)
Alkaline Phosphatase: 60 U/L (ref 39–117)
BUN: 12 mg/dL (ref 6–23)
CO2: 30 meq/L (ref 19–32)
Calcium: 9.2 mg/dL (ref 8.4–10.5)
Chloride: 93 meq/L — ABNORMAL LOW (ref 96–112)
Creatinine, Ser: 0.82 mg/dL (ref 0.40–1.20)
GFR: 65.7 mL/min (ref >60.00)
Glucose, Bld: 93 mg/dL (ref 70–99)
Potassium: 5.3 meq/L — ABNORMAL HIGH (ref 3.5–5.1)
Sodium: 128 meq/L — ABNORMAL LOW (ref 135–145)
Total Bilirubin: 0.4 mg/dL (ref 0.2–1.2)
Total Protein: 7.9 g/dL (ref 6.0–8.3)

## 2023-11-27 MED ORDER — ALPRAZOLAM 0.25 MG PO TABS: 0.2500 mg | ORAL_TABLET | Freq: Every day | ORAL | 5 refills | Status: DC

## 2023-11-27 MED ORDER — LEVOTHYROXINE SODIUM 50 MCG PO TABS: ORAL_TABLET | ORAL | 1 refills | Status: DC

## 2023-11-27 MED ORDER — TRAMADOL HCL 50 MG PO TABS: ORAL_TABLET | ORAL | 5 refills | Status: AC

## 2023-11-27 NOTE — Assessment & Plan Note (Addendum)
 Stable ,  present since 2011.  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  Managed with liberalization of  salt in diet and maintainenance  good hydration with 60 ounces of water daily   Lab Results  Component Value Date   NA 128 (L) 11/27/2023   K 5.3 No hemolysis seen (H) 11/27/2023   CL 93 (L) 11/27/2023   CO2 30 11/27/2023

## 2023-11-27 NOTE — Progress Notes (Signed)
 Subjective:  Patient ID: Holly Perez, female    DOB: [birth year]  Age: 83 y.o. MRN: [id number]  CC: The primary encounter diagnosis was Hyponatremia. Diagnoses of Acquired hypothyroidism, Chronic hyponatremia, White coat syndrome with hypertension, Mixed anxiety and depressive disorder, and Back pain of thoracolumbar region were also pertinent to this visit.   HPI Holly Perez presents for  Chief Complaint  Patient presents with   Medical Management of Chronic Issues    4 month follow up   1) GAD:  her dearest cousin has been diagnosed with cancer and she has been having trouble sleeping because of the news  2) chronic pain : no escalation.  Using tylenol and tramadol  to manage hip and back pain   3 hypothyroid;  taking levothyroxine .  No symptoms of over or underact thryoid diseasea  4) varicose veins around both ankles     tender.   not wearing compression knee  highs   5) Hypertension: patient checks blood pressure twice weekly at home.  Readings have been for the most part <130/80 at rest . Patient is following a reduced salt diet most days and is taking medications as prescribed   Outpatient Medications Prior to Visit  Medication Sig Dispense Refill   amLODipine  (NORVASC ) 5 MG tablet Take 1 tablet (5 mg total) by mouth daily. 90 tablet 1   aspirin 81 MG tablet Take 81 mg by mouth daily.     Calcium Carbonate-Vitamin D  (CALTRATE 600+D PO) Take 1,200 mg by mouth daily.      cholecalciferol (VITAMIN D ) 1000 UNITS tablet Take 2,000 Units by mouth daily.     fish oil-omega-3 fatty acids 1000 MG capsule Take 1 g by mouth daily.      losartan  (COZAAR ) 50 MG tablet Take 2 tablets (100 mg total) by mouth daily. 180 tablet 1   Multiple Vitamin (MULTIVITAMIN) tablet Take 1 tablet by mouth daily.     vitamin E 100 UNIT capsule Take 100 Units by mouth daily.     ALPRAZolam  (XANAX ) 0.25 MG tablet Take 1 tablet (0.25 mg total) by mouth at bedtime. 31 tablet 5   levothyroxine  (SYNTHROID )  50 MCG tablet TAKE 1 TABLET(50 MCG) BY MOUTH DAILY AND 2 TABLETS ON  SUNDAYS 103 tablet 1   traMADol  (ULTRAM ) 50 MG tablet TAKE 1 TABLET BY MOUTH TWICE DAILY FOR MODERATE PAIN 60 tablet 5   No facility-administered medications prior to visit.    Review of Systems;  Patient denies headache, fevers, malaise, unintentional weight loss, skin rash, eye pain, sinus congestion and sinus pain, sore throat, dysphagia,  hemoptysis , cough, dyspnea, wheezing, chest pain, palpitations, orthopnea, edema, abdominal pain, nausea, melena, diarrhea, constipation, flank pain, dysuria, hematuria, urinary  Frequency, nocturia, numbness, tingling, seizures,  Focal weakness, Loss of consciousness,  Tremor, insomnia, depression, anxiety, and suicidal ideation.      Objective:  BP 130/78   Pulse 83   Ht 5' (1.524 m)   Wt 124 lb 9.6 oz (56.5 kg)   SpO2 99%   BMI 24.33 kg/m   BP Readings from Last 3 Encounters:  11/27/23 130/78  07/25/23 128/78  06/20/23 124/68    Wt Readings from Last 3 Encounters:  11/27/23 124 lb 9.6 oz (56.5 kg)  07/25/23 122 lb 6.4 oz (55.5 kg)  06/20/23 123 lb 6.4 oz (56 kg)    Physical Exam Vitals reviewed.  Constitutional:      General: She is not in acute distress.    Appearance: Normal  appearance. She is normal weight. She is not ill-appearing, toxic-appearing or diaphoretic.  HENT:     Head: Normocephalic.  Eyes:     General: No scleral icterus.       Right eye: No discharge.        Left eye: No discharge.     Conjunctiva/sclera: Conjunctivae normal.  Cardiovascular:     Rate and Rhythm: Normal rate and regular rhythm.     Heart sounds: Normal heart sounds.  Pulmonary:     Effort: Pulmonary effort is normal. No respiratory distress.     Breath sounds: Normal breath sounds.  Musculoskeletal:        General: Normal range of motion.  Skin:    General: Skin is warm and dry.  Neurological:     General: No focal deficit present.     Mental Status: She is alert and  oriented to person, place, and time. Mental status is at baseline.  Psychiatric:        Mood and Affect: Mood normal.        Behavior: Behavior normal.        Thought Content: Thought content normal.        Judgment: Judgment normal.     Lab Results  Component Value Date   HGBA1C 5.7 09/26/2022   HGBA1C 5.9 09/13/2021    Lab Results  Component Value Date   CREATININE 0.82 11/27/2023   CREATININE 0.82 06/20/2023   CREATININE 0.74 03/21/2023    Lab Results  Component Value Date   WBC 4.1 09/26/2022   HGB 12.3 09/26/2022   HCT 38.1 09/26/2022   PLT 395.0 09/26/2022   GLUCOSE 93 11/27/2023   CHOL 163 06/20/2023   TRIG 58.0 06/20/2023   HDL 59.40 06/20/2023   LDLDIRECT 91.0 06/20/2023   LDLCALC 92 06/20/2023   ALT 20 11/27/2023   AST 30 11/27/2023   NA 128 (L) 11/27/2023   K 5.3 No hemolysis seen (H) 11/27/2023   CL 93 (L) 11/27/2023   CREATININE 0.82 11/27/2023   BUN 12 11/27/2023   CO2 30 11/27/2023   TSH 1.83 08/22/2023   HGBA1C 5.7 09/26/2022   MICROALBUR 16.2 (H) 06/20/2023    MM 3D SCREENING MAMMOGRAM BILATERAL BREAST Result Date: 07/08/2023 CLINICAL DATA:  Screening. EXAM: DIGITAL SCREENING BILATERAL MAMMOGRAM WITH TOMOSYNTHESIS AND CAD TECHNIQUE: Bilateral screening digital craniocaudal and mediolateral oblique mammograms were obtained. Bilateral screening digital breast tomosynthesis was performed. The images were evaluated with computer-aided detection. COMPARISON:  Previous exam(s). ACR Breast Density Category b: There are scattered areas of fibroglandular density. FINDINGS: There are no findings suspicious for malignancy. IMPRESSION: No mammographic evidence of malignancy. A result letter of this screening mammogram will be mailed directly to the patient. RECOMMENDATION: Screening mammogram in one year. (Code:SM-B-01Y) BI-RADS CATEGORY  1: Negative. Electronically Signed   By: Inocente Ast M.D.   On: 07/08/2023 15:40    Assessment & Plan:   .Hyponatremia -     Comprehensive metabolic panel with GFR  Acquired hypothyroidism Assessment & Plan: Thyroid  function has normalized with  change in regimen to 100 mccg on Sundays   50 mcg all other days   Lab Results  Component Value Date   TSH 1.83 08/22/2023      Chronic hyponatremia Assessment & Plan: Stable ,  present since 2011.  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  Managed with liberalization of  salt in diet and maintainenance  good hydration with 60 ounces of  water daily   Lab Results  Component Value Date   NA 128 (L) 11/27/2023   K 5.3 No hemolysis seen (H) 11/27/2023   CL 93 (L) 11/27/2023   CO2 30 11/27/2023      White coat syndrome with hypertension Assessment & Plan: Well controlled on amlodipine  and losartan   regimen based on home readings . Renal function stable, no changes today.  Repeat potassium needed,    Lab Results  Component Value Date   CREATININE 0.82 11/27/2023   Lab Results  Component Value Date   NA 128 (L) 11/27/2023   K 5.3 No hemolysis seen (H) 11/27/2023   CL 93 (L) 11/27/2023   CO2 30 11/27/2023      Mixed anxiety and depressive disorder Assessment & Plan: Complicated by grief following the tragic death of her brother in 01/01/2023 and the recent new of cancer in her dearest cousin. She has not tolerated trials of SSRIs .  She is using alprazolam  responsibly and has reduced use to once daily at bedtime .   The risks and benefits of benzodiazepine use were reviewed  with patient today including excessive sedation leading to respiratory depression,  impaired thinking/driving, and addiction.  Patient has had no withdrawal symptoms during periods of unintentional suspension. She was reminded to avoid concurrent use with alcohol, to use medication only as needed and not to share with others  .    Back pain of thoracolumbar region Assessment & Plan: Managed with exercise,  Weight training,  Tylenol  and prn Tramadol  .  She has not had any ER visits  And has not requested any early refills.  Her Refill history was confirmed via [place] Controlled Substance database by me today during her visit and there have been no prescriptions of controlled substances filled from any providers other than me. .    Other orders -     Levothyroxine  Sodium; TAKE 1 TABLET(50 MCG) BY MOUTH DAILY AND 2 TABLETS ON  SUNDAYS  Dispense: 103 tablet; Refill: 1 -     traMADol  HCl; TAKE 1 TABLET BY MOUTH TWICE DAILY FOR MODERATE PAIN  Dispense: 60 tablet; Refill: 5 -     ALPRAZolam ; Take 1 tablet (0.25 mg total) by mouth at bedtime.  Dispense: 31 tablet; Refill: 5    Follow-up: Return in about 6 months (around 05/29/2024) for hypertension, chronic pain management.   Verneita LITTIE Kettering, MD

## 2023-11-27 NOTE — Assessment & Plan Note (Addendum)
 Thyroid  function has normalized with  change in regimen to 100 mccg on Sundays   50 mcg all other days   Lab Results  Component Value Date   TSH 1.83 08/22/2023

## 2023-11-29 ENCOUNTER — Ambulatory Visit: Payer: Self-pay | Admitting: Internal Medicine

## 2023-11-29 DIAGNOSIS — E871 Hypo-osmolality and hyponatremia: Secondary | ICD-10-CM

## 2023-11-29 NOTE — Assessment & Plan Note (Signed)
 Complicated by grief following the tragic death of her brother in Dec 21, 2022 and the recent new of cancer in her dearest cousin. She has not tolerated trials of SSRIs .  She is using alprazolam  responsibly and has reduced use to once daily at bedtime .   The risks and benefits of benzodiazepine use were reviewed  with patient today including excessive sedation leading to respiratory depression,  impaired thinking/driving, and addiction.  Patient has had no withdrawal symptoms during periods of unintentional suspension. She was reminded to avoid concurrent use with alcohol, to use medication only as needed and not to share with others  .

## 2023-11-29 NOTE — Assessment & Plan Note (Signed)
 Well controlled on amlodipine  and losartan   regimen based on home readings . Renal function stable, no changes today.  Repeat potassium needed,    Lab Results  Component Value Date   CREATININE 0.82 11/27/2023   Lab Results  Component Value Date   NA 128 (L) 11/27/2023   K 5.3 No hemolysis seen (H) 11/27/2023   CL 93 (L) 11/27/2023   CO2 30 11/27/2023

## 2023-11-29 NOTE — Assessment & Plan Note (Signed)
Managed with exercise,  Weight training,  Tylenol and prn Tramadol .  She has not had any ER visits  And has not requested any early refills.  Her Refill history was confirmed via [place] Controlled Substance database by me today during her visit and there have been no prescriptions of controlled substances filled from any providers other than me. .  

## 2023-12-05 ENCOUNTER — Other Ambulatory Visit (INDEPENDENT_AMBULATORY_CARE_PROVIDER_SITE_OTHER)

## 2023-12-05 ENCOUNTER — Other Ambulatory Visit: Payer: Self-pay

## 2023-12-05 DIAGNOSIS — R899 Unspecified abnormal finding in specimens from other organs, systems and tissues: Secondary | ICD-10-CM

## 2023-12-05 LAB — COMPREHENSIVE METABOLIC PANEL WITH GFR
ALT: 17 U/L (ref 0–35)
AST: 27 U/L (ref 0–37)
Albumin: 3.8 g/dL (ref 3.5–5.2)
Alkaline Phosphatase: 59 U/L (ref 39–117)
BUN: 11 mg/dL (ref 6–23)
CO2: 28 meq/L (ref 19–32)
Calcium: 9.2 mg/dL (ref 8.4–10.5)
Chloride: 94 meq/L — ABNORMAL LOW (ref 96–112)
Creatinine, Ser: 0.71 mg/dL (ref 0.40–1.20)
GFR: 78.08 mL/min (ref >60.00)
Glucose, Bld: 82 mg/dL (ref 70–99)
Potassium: 4.6 meq/L (ref 3.5–5.1)
Sodium: 130 meq/L — ABNORMAL LOW (ref 135–145)
Total Bilirubin: 0.4 mg/dL (ref 0.2–1.2)
Total Protein: 7.3 g/dL (ref 6.0–8.3)

## 2023-12-06 ENCOUNTER — Ambulatory Visit: Payer: Self-pay | Admitting: Internal Medicine

## 2023-12-09 ENCOUNTER — Ambulatory Visit: Payer: Self-pay | Admitting: Internal Medicine

## 2024-02-09 ENCOUNTER — Emergency Department

## 2024-02-09 ENCOUNTER — Other Ambulatory Visit: Payer: Self-pay

## 2024-02-09 ENCOUNTER — Emergency Department
Admission: EM | Admit: 2024-02-09 | Discharge: 2024-02-09 | Disposition: A | Discharge status: Home / Self Care | Attending: Emergency Medicine | Admitting: Emergency Medicine

## 2024-02-09 DIAGNOSIS — I10 Essential (primary) hypertension: Secondary | ICD-10-CM | POA: Insufficient documentation

## 2024-02-09 DIAGNOSIS — Y92410 Unspecified street and highway as the place of occurrence of the external cause: Secondary | ICD-10-CM | POA: Insufficient documentation

## 2024-02-09 DIAGNOSIS — R0789 Other chest pain: Secondary | ICD-10-CM | POA: Diagnosis present

## 2024-02-09 DIAGNOSIS — S0990XA Unspecified injury of head, initial encounter: Secondary | ICD-10-CM | POA: Diagnosis not present

## 2024-02-09 DIAGNOSIS — Z041 Encounter for examination and observation following transport accident: Secondary | ICD-10-CM | POA: Diagnosis not present

## 2024-02-09 DIAGNOSIS — J45909 Unspecified asthma, uncomplicated: Secondary | ICD-10-CM | POA: Insufficient documentation
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
## 2024-03-03 DIAGNOSIS — H04123 Dry eye syndrome of bilateral lacrimal glands: Secondary | ICD-10-CM | POA: Diagnosis not present

## 2024-03-03 DIAGNOSIS — H2513 Age-related nuclear cataract, bilateral: Secondary | ICD-10-CM | POA: Diagnosis not present

## 2024-03-03 DIAGNOSIS — H5203 Hypermetropia, bilateral: Secondary | ICD-10-CM | POA: Diagnosis not present

## 2024-03-03 DIAGNOSIS — H52223 Regular astigmatism, bilateral: Secondary | ICD-10-CM | POA: Diagnosis not present

## 2024-04-02 ENCOUNTER — Other Ambulatory Visit: Payer: Self-pay | Admitting: Internal Medicine

## 2024-04-07 ENCOUNTER — Ambulatory Visit: Payer: Medicare Other

## 2024-04-07 VITALS — Ht 60.0 in | Wt 124.0 lb

## 2024-04-07 DIAGNOSIS — Z0000 Encounter for general adult medical examination without abnormal findings: Secondary | ICD-10-CM
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
## 2024-04-13 ENCOUNTER — Other Ambulatory Visit: Payer: Self-pay

## 2024-04-13 MED ORDER — AMLODIPINE BESYLATE 5 MG PO TABS: 5.0000 mg | ORAL_TABLET | Freq: Every day | ORAL | 1 refills | Status: AC

## 2024-04-29 ENCOUNTER — Other Ambulatory Visit: Payer: Self-pay

## 2024-04-29 ENCOUNTER — Telehealth: Payer: Self-pay | Admitting: Internal Medicine

## 2024-04-29 MED ORDER — ALPRAZOLAM 0.25 MG PO TABS: 0.2500 mg | ORAL_TABLET | Freq: Every day | ORAL | 5 refills | Status: AC

## 2024-04-29 NOTE — Telephone Encounter (Signed)
 Noted.

## 2024-04-29 NOTE — Telephone Encounter (Signed)
 Refilled: 11/27/23 Last OV: 11/27/2023 Next OV: 06/02/2024  Controlled substance agreement has been placed up front for pt to come by and sign. Pt stated that she would come today.

## 2024-04-29 NOTE — Telephone Encounter (Signed)
 Non- opioid substance agreement has been scanned into the chart under release of information.

## 2024-06-02 ENCOUNTER — Ambulatory Visit: Admitting: Internal Medicine

## 2025-04-06 ENCOUNTER — Ambulatory Visit
